# Patient Record
Sex: Female | Born: 1995 | Race: White | Hispanic: No | Marital: Married | Smoking: Never smoker
Health system: Southern US, Community
[De-identification: ages and names within clinical notes are randomized; demographics above are authoritative.]

## PROBLEM LIST (undated history)

## (undated) ENCOUNTER — Inpatient Hospital Stay: Payer: Self-pay

## (undated) DIAGNOSIS — G90A Postural orthostatic tachycardia syndrome (POTS): Secondary | ICD-10-CM

## (undated) DIAGNOSIS — S060X9A Concussion with loss of consciousness of unspecified duration, initial encounter: Secondary | ICD-10-CM

## (undated) DIAGNOSIS — F32A Depression, unspecified: Secondary | ICD-10-CM

## (undated) DIAGNOSIS — S060XAA Concussion with loss of consciousness status unknown, initial encounter: Secondary | ICD-10-CM

## (undated) DIAGNOSIS — K5900 Constipation, unspecified: Secondary | ICD-10-CM

## (undated) DIAGNOSIS — D649 Anemia, unspecified: Secondary | ICD-10-CM

## (undated) DIAGNOSIS — I951 Orthostatic hypotension: Secondary | ICD-10-CM

## (undated) DIAGNOSIS — Z8659 Personal history of other mental and behavioral disorders: Secondary | ICD-10-CM

## (undated) DIAGNOSIS — L309 Dermatitis, unspecified: Secondary | ICD-10-CM

## (undated) DIAGNOSIS — A1801 Tuberculosis of spine: Secondary | ICD-10-CM

## (undated) DIAGNOSIS — F329 Major depressive disorder, single episode, unspecified: Secondary | ICD-10-CM

## (undated) DIAGNOSIS — I498 Other specified cardiac arrhythmias: Secondary | ICD-10-CM

## (undated) DIAGNOSIS — F419 Anxiety disorder, unspecified: Secondary | ICD-10-CM

## (undated) DIAGNOSIS — I499 Cardiac arrhythmia, unspecified: Secondary | ICD-10-CM

## (undated) DIAGNOSIS — E559 Vitamin D deficiency, unspecified: Secondary | ICD-10-CM

## (undated) DIAGNOSIS — M2550 Pain in unspecified joint: Secondary | ICD-10-CM

## (undated) HISTORY — DX: Dermatitis, unspecified: L30.9

## (undated) HISTORY — DX: Anemia, unspecified: D64.9

## (undated) HISTORY — DX: Cardiac arrhythmia, unspecified: I49.9

## (undated) HISTORY — PX: ADENOIDECTOMY: SUR15

## (undated) HISTORY — DX: Postural orthostatic tachycardia syndrome (POTS): G90.A

## (undated) HISTORY — DX: Personal history of other mental and behavioral disorders: Z86.59

## (undated) HISTORY — DX: Other specified cardiac arrhythmias: I49.8

## (undated) HISTORY — DX: Concussion with loss of consciousness status unknown, initial encounter: S06.0XAA

## (undated) HISTORY — DX: Orthostatic hypotension: I95.1

## (undated) HISTORY — DX: Concussion with loss of consciousness of unspecified duration, initial encounter: S06.0X9A

## (undated) HISTORY — DX: Depression, unspecified: F32.A

## (undated) HISTORY — DX: Vitamin D deficiency, unspecified: E55.9

## (undated) HISTORY — DX: Anxiety disorder, unspecified: F41.9

## (undated) HISTORY — DX: Constipation, unspecified: K59.00

---
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
## 2017-03-24 IMAGING — CR DG CHEST 2V
1 series · 2 of 2 positions shown · non-contrast
Comparison: None.

CLINICAL DATA: Chest pain

EXAM:
CHEST  2 VIEW

[Series 1: dg chest 2 view · 0.14mm/px · 2 of 2 slices shown]
[im 1/2]
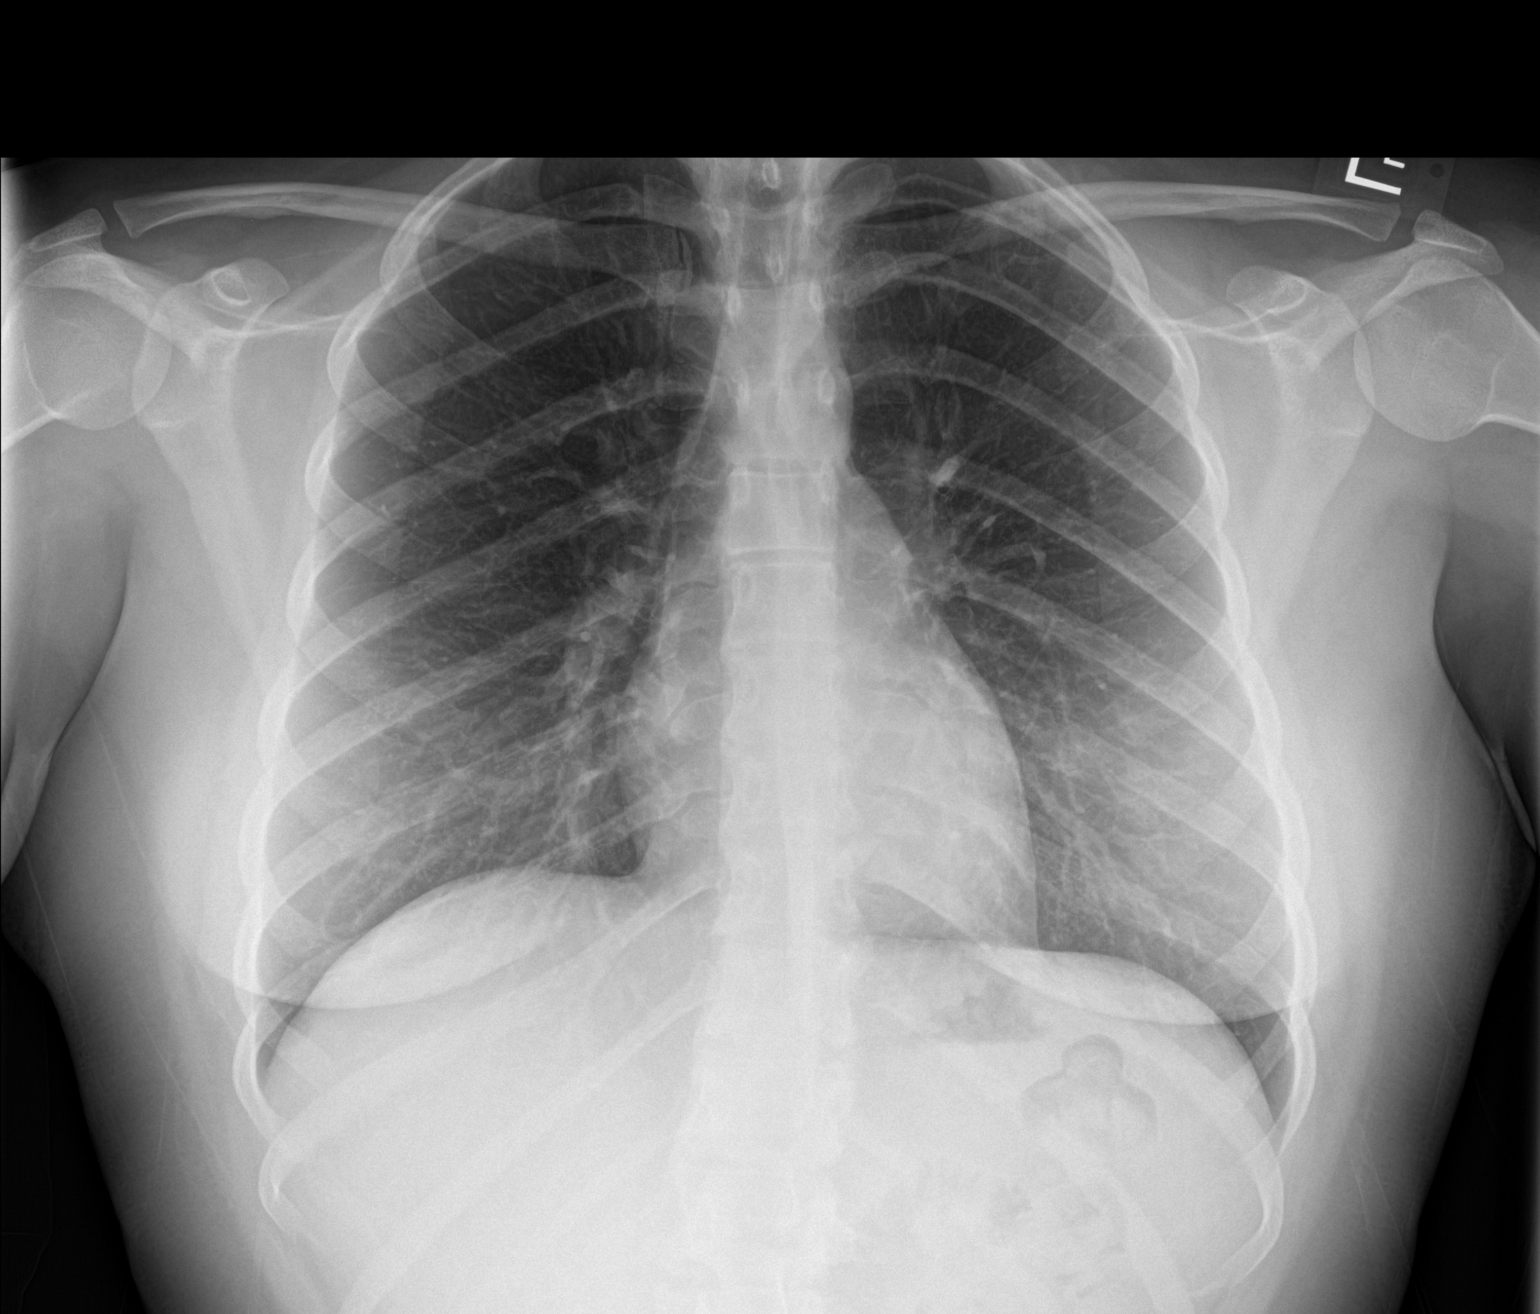
[im 2/2]
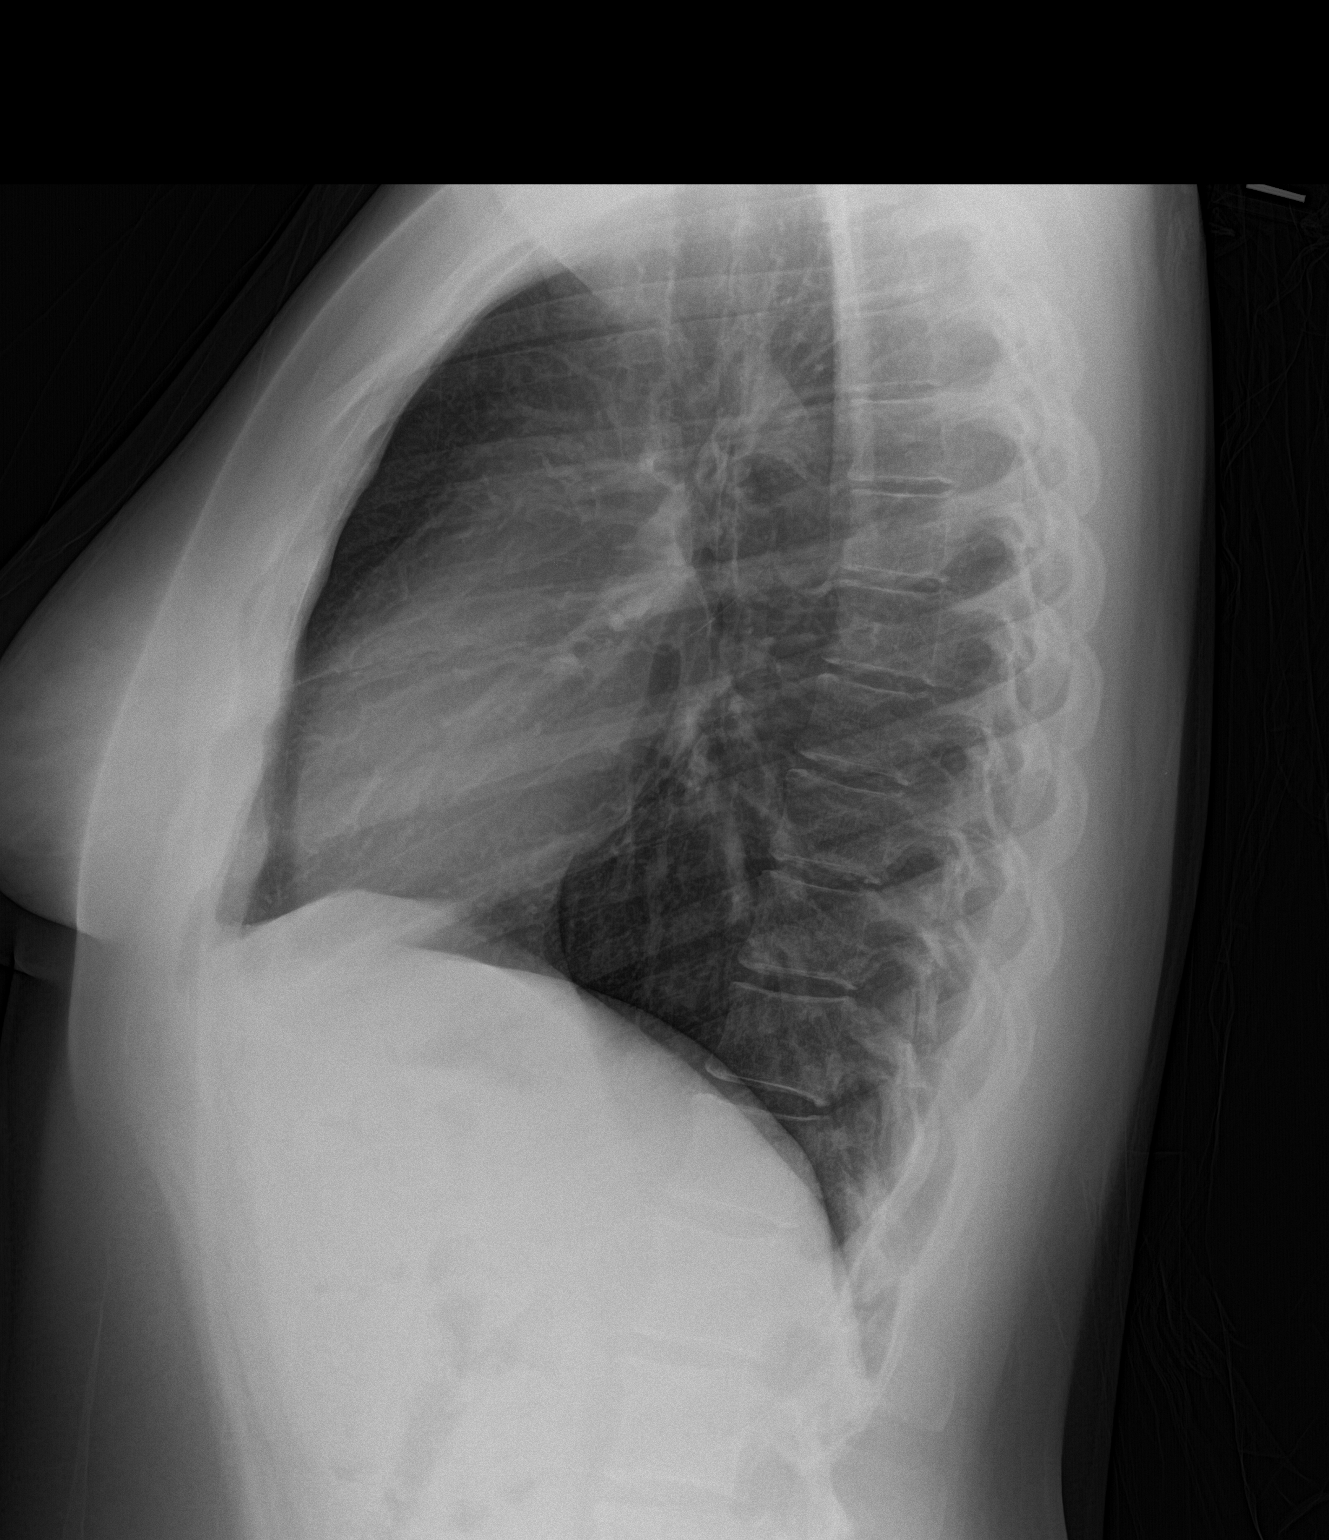

[2 of 2 positions shown; findings below may reference images not displayed]

FINDINGS: Normal heart size. Normal mediastinal contour. No pneumothorax. No
pleural effusion. Lungs appear clear, with no acute consolidative
airspace disease and no pulmonary edema.
IMPRESSION: No active cardiopulmonary disease.

## 2017-04-10 ENCOUNTER — Emergency Department: Payer: BLUE CROSS/BLUE SHIELD

## 2017-04-10 ENCOUNTER — Other Ambulatory Visit: Payer: Self-pay

## 2017-04-10 ENCOUNTER — Emergency Department
Admission: EM | Admit: 2017-04-10 | Discharge: 2017-04-10 | Disposition: A | Discharge status: Home / Self Care | Payer: BLUE CROSS/BLUE SHIELD | Attending: Emergency Medicine | Admitting: Emergency Medicine

## 2017-04-10 DIAGNOSIS — Z79899 Other long term (current) drug therapy: Secondary | ICD-10-CM | POA: Diagnosis not present

## 2017-04-10 DIAGNOSIS — M25512 Pain in left shoulder: Secondary | ICD-10-CM | POA: Insufficient documentation

## 2017-04-10 DIAGNOSIS — R0789 Other chest pain: Secondary | ICD-10-CM | POA: Diagnosis not present

## 2017-04-10 DIAGNOSIS — Y9389 Activity, other specified: Secondary | ICD-10-CM | POA: Diagnosis not present

## 2017-04-10 DIAGNOSIS — M25562 Pain in left knee: Secondary | ICD-10-CM | POA: Diagnosis not present

## 2017-04-10 DIAGNOSIS — Y999 Unspecified external cause status: Secondary | ICD-10-CM | POA: Insufficient documentation

## 2017-04-10 DIAGNOSIS — Y92411 Interstate highway as the place of occurrence of the external cause: Secondary | ICD-10-CM | POA: Insufficient documentation

## 2017-04-10 MED ORDER — CYCLOBENZAPRINE HCL 10 MG PO TABS: 10.0000 mg | ORAL_TABLET | Freq: Three times a day (TID) | ORAL | 0 refills | Status: AC | PRN

## 2017-04-10 MED ORDER — KETOROLAC TROMETHAMINE 30 MG/ML IJ SOLN
30.0000 mg | Freq: Once | INTRAMUSCULAR | Status: AC
  Administered 2017-04-10: 30 mg via INTRAMUSCULAR
  Filled 2017-04-10: qty 1

## 2017-04-10 MED ORDER — ORPHENADRINE CITRATE 30 MG/ML IJ SOLN
60.0000 mg | Freq: Two times a day (BID) | INTRAMUSCULAR | Status: DC
  Filled 2017-04-10: qty 2

## 2017-04-10 MED ORDER — MELOXICAM 7.5 MG PO TABS: 7.5000 mg | ORAL_TABLET | Freq: Every day | ORAL | 1 refills | Status: AC
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
## 2017-04-10 NOTE — ED Provider Notes (Signed)
Lurline Idol, attending physician, personally viewed and interpreted this EKG  EKG Time: 2251 Rate: 73 Rhythm: normal sinus rhythm Axis: normal Intervals: qtc 425 QRS: narrow ST changes: no st elevation Impression: normal Maryan Puls, MD 04/10/17 2257
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
## 2017-06-22 ENCOUNTER — Emergency Department: Payer: BLUE CROSS/BLUE SHIELD

## 2017-06-22 ENCOUNTER — Emergency Department
Admission: EM | Admit: 2017-06-22 | Discharge: 2017-06-22 | Disposition: A | Discharge status: Home / Self Care | Payer: BLUE CROSS/BLUE SHIELD | Attending: Emergency Medicine | Admitting: Emergency Medicine

## 2017-06-22 ENCOUNTER — Other Ambulatory Visit: Payer: Self-pay

## 2017-06-22 ENCOUNTER — Encounter: Payer: Self-pay | Admitting: Emergency Medicine

## 2017-06-22 DIAGNOSIS — Y9301 Activity, walking, marching and hiking: Secondary | ICD-10-CM | POA: Diagnosis not present

## 2017-06-22 DIAGNOSIS — S99911A Unspecified injury of right ankle, initial encounter: Secondary | ICD-10-CM | POA: Diagnosis present

## 2017-06-22 DIAGNOSIS — S93431A Sprain of tibiofibular ligament of right ankle, initial encounter: Secondary | ICD-10-CM | POA: Diagnosis not present

## 2017-06-22 DIAGNOSIS — Z79899 Other long term (current) drug therapy: Secondary | ICD-10-CM | POA: Insufficient documentation

## 2017-06-22 DIAGNOSIS — S93491A Sprain of other ligament of right ankle, initial encounter: Secondary | ICD-10-CM

## 2017-06-22 DIAGNOSIS — X509XXA Other and unspecified overexertion or strenuous movements or postures, initial encounter: Secondary | ICD-10-CM | POA: Insufficient documentation

## 2017-06-22 DIAGNOSIS — Y929 Unspecified place or not applicable: Secondary | ICD-10-CM | POA: Diagnosis not present

## 2017-06-22 DIAGNOSIS — Y999 Unspecified external cause status: Secondary | ICD-10-CM | POA: Diagnosis not present

## 2017-06-22 MED ORDER — NAPROXEN 500 MG PO TABS: 500.0000 mg | ORAL_TABLET | Freq: Two times a day (BID) | ORAL | 0 refills | Status: DC

## 2017-06-22 MED ORDER — TRAMADOL HCL 50 MG PO TABS: 50.0000 mg | ORAL_TABLET | Freq: Four times a day (QID) | ORAL | 0 refills | Status: DC | PRN

## 2017-06-22 NOTE — ED Provider Notes (Signed)
[facility] Emergency Department Provider Note ____________________________________________  Time seen: Approximately 1:40 PM  I have reviewed the triage vital signs and the nursing notes.   HISTORY  Chief Complaint Ankle Pain    HPI Ashley Strickland is a 21 y.o. female who presents to the emergency department for evaluation of right ankle pain. She states that she slipped and twisted it 2 days ago, then injured it again today while cleaning. She states that she felt a "pop." She has not taken any medications or attempted any alleviating measures prior to arrival.  Past Medical History:  Diagnosis Date  . Joint pain     There are no active problems to display for this patient.   Past Surgical History:  Procedure Laterality Date  . ADENOIDECTOMY      Prior to Admission medications   Medication Sig Start Date End Date Taking? Authorizing Provider  meclizine (ANTIVERT) 12.5 MG tablet Take 1 tablet (12.5 mg total) by mouth 3 (three) times daily as needed for dizziness. 10/31/16   Menshew, Charlesetta IvoryJenise V Bacon, PA-C  naproxen (NAPROSYN) 500 MG tablet Take 1 tablet (500 mg total) by mouth 2 (two) times daily with a meal. 06/22/17   Denzil Mceachron B, FNP  OMEPRAZOLE PO Take by mouth.    [provider]  ondansetron (ZOFRAN ODT) 4 MG disintegrating tablet Take 1 tablet (4 mg total) by mouth every 8 (eight) hours as needed for nausea or vomiting. 10/31/16   Menshew, Charlesetta IvoryJenise V Bacon, PA-C  ranitidine (ZANTAC) 75 MG tablet Take 1 tablet (75 mg total) by mouth 2 (two) times daily. 08/30/15   Schaevitz, Myra Rudeavid Matthew, MD  traMADol (ULTRAM) 50 MG tablet Take 1 tablet (50 mg total) by mouth every 6 (six) hours as needed. 06/22/17   Chinita Pesterriplett, Roselind Klus B, FNP    Allergies Phenergan [promethazine hcl]  No family history on file.  Social History Social History   Tobacco Use  . Smoking status: Never Smoker  . Smokeless tobacco: Never Used  Substance Use Topics  .  Alcohol use: No  . Drug use: No    Review of Systems Constitutional: Negative for recent illness. Cardiovascular: Negative for chest pain. Respiratory: Negative for shortness of breath. Musculoskeletal: Positive for right ankle pain. Skin: Negative for lesion or wound.  Neurological: Negative for paresthesias.  ____________________________________________   PHYSICAL EXAM:  VITAL SIGNS: ED Triage Vitals  Enc Vitals Group     BP 06/22/17 1305 120/77     Pulse Rate 06/22/17 1305 (!) 118     Resp 06/22/17 1305 15     Temp 06/22/17 1305 98.3 F (36.8 C)     Temp Source 06/22/17 1305 Oral     SpO2 06/22/17 1305 99 %     Weight 06/22/17 1255 200 lb (90.7 kg)     Height --      Head Circumference --      Peak Flow --      Pain Score 06/22/17 1255 7     Pain Loc --      Pain Edu? --      Excl. in GC? --     Constitutional: Alert and oriented. Well appearing and in no acute distress. Eyes: Conjunctivae are clear without discharge or drainage Head: Atraumatic Neck: Supple. Respiratory: Respirations are even and unlabored. Musculoskeletal: Limited flexion and extension of the right foot. Tenderness is diffuse over the ATFL.  Neurologic: Movement and sensation is intact.  Skin: Small, superficial abrasion is present over  the dorsal aspect of the right foot.  Psychiatric: Affect and behavior are normal.  ____________________________________________   LABS (all labs ordered are listed, but only abnormal results are displayed)  Labs Reviewed - No data to display ____________________________________________  RADIOLOGY  Image of the right ankle is negative for acute bony abnormality per radiology. ____________________________________________   PROCEDURES  Procedures Velcro ankle stirrup splint applied by ER tech. Patient neurovascularly intact post application.  ____________________________________________   INITIAL IMPRESSION / ASSESSMENT AND PLAN / ED  COURSE  Ashley Molamariah Joy Pagliuca is a 21 y.o. female who presents to the emergency department for treatment and evaluation of right ankle pain. X-rays and exam are most consistent with ankle sprain. She is to follow up with the podiatrist for symptoms that are not improving over the next week or so. She is to return to the ER for symptoms that change or worsen or for new concerns if unable to schedule an appointment.  Medications - No data to display  Pertinent labs & imaging results that were available during my care of the patient were reviewed by me and considered in my medical decision making (see chart for details).  _________________________________________   FINAL CLINICAL IMPRESSION(S) / ED DIAGNOSES  Final diagnoses:  Sprain of anterior talofibular ligament of right ankle, initial encounter    ED Discharge Orders        Ordered    naproxen (NAPROSYN) 500 MG tablet  2 times daily with meals     06/22/17 1432    traMADol (ULTRAM) 50 MG tablet  Every 6 hours PRN     06/22/17 1432       If controlled substance prescribed during this visit, 12 month history viewed on the [place] prior to issuing an initial prescription for Schedule II or III opiod.    Chinita Pesterriplett, Shaquayla Klimas B, FNP 06/22/17 1527    Emily FilbertWilliams, Jonathan E, MD 06/24/17 [phone]

## 2017-06-22 NOTE — Discharge Instructions (Signed)
Follow up with the podiatrist for symptoms that are not improving over the next few days.   Return to the ER for symptoms that change or worsen if unable to schedule an appointment.

## 2017-06-22 NOTE — ED Triage Notes (Signed)
Pt with right ankle pain.  

## 2017-09-01 LAB — CBC AND DIFFERENTIAL
HCT: 42 (ref 36–46)
Hemoglobin: 14.1 (ref 12.0–16.0)
Platelets: 334 (ref 150–399)
WBC: 19.2

## 2017-09-01 LAB — LIPID PANEL
Cholesterol: 139 (ref 0–200)
HDL: 57 (ref 35–70)
LDL Cholesterol: 61
LDl/HDL Ratio: 1.1
Triglycerides: 104 (ref 40–160)

## 2017-09-01 LAB — BASIC METABOLIC PANEL
BUN: 13 (ref 4–21)
CO2: 23 — AB (ref 13–22)
Chloride: 103 (ref 99–108)
Creatinine: 0.9 (ref 0.5–1.1)
Glucose: 85
Potassium: 4.5 (ref 3.4–5.3)
Sodium: 141 (ref 137–147)

## 2017-09-01 LAB — TSH: TSH: 0.76 (ref 0.41–5.90)

## 2017-09-01 LAB — CBC: RBC: 5.1 (ref 3.87–5.11)

## 2017-09-01 LAB — HEPATIC FUNCTION PANEL
ALT: 11 (ref 7–35)
AST: 12 — AB (ref 13–35)
Alkaline Phosphatase: 82 (ref 25–125)
Bilirubin, Total: 1.1

## 2017-09-01 LAB — COMPREHENSIVE METABOLIC PANEL
Albumin: 3.9 (ref 3.5–5.0)
Calcium: 9.5 (ref 8.7–10.7)

## 2017-09-01 LAB — VITAMIN D 25 HYDROXY (VIT D DEFICIENCY, FRACTURES): Vit D, 25-Hydroxy: 22.7

## 2017-10-05 ENCOUNTER — Encounter: Payer: Self-pay | Admitting: Emergency Medicine

## 2017-10-05 ENCOUNTER — Other Ambulatory Visit: Payer: Self-pay

## 2017-10-05 DIAGNOSIS — Y929 Unspecified place or not applicable: Secondary | ICD-10-CM | POA: Diagnosis not present

## 2017-10-05 DIAGNOSIS — W57XXXA Bitten or stung by nonvenomous insect and other nonvenomous arthropods, initial encounter: Secondary | ICD-10-CM | POA: Diagnosis not present

## 2017-10-05 DIAGNOSIS — Y999 Unspecified external cause status: Secondary | ICD-10-CM | POA: Diagnosis not present

## 2017-10-05 DIAGNOSIS — Y939 Activity, unspecified: Secondary | ICD-10-CM | POA: Insufficient documentation

## 2017-10-05 DIAGNOSIS — S70362A Insect bite (nonvenomous), left thigh, initial encounter: Secondary | ICD-10-CM | POA: Insufficient documentation

## 2017-10-05 DIAGNOSIS — L03116 Cellulitis of left lower limb: Secondary | ICD-10-CM | POA: Insufficient documentation

## 2017-10-05 NOTE — ED Triage Notes (Signed)
Pt presents to ED with c/o possible insect bite to left thigh. Pt states she woke up this morning with redness and pain to affected area. Pt states the redness has increased in size throughout the day. Denies fever.

## 2017-10-06 ENCOUNTER — Emergency Department
Admission: EM | Admit: 2017-10-06 | Discharge: 2017-10-06 | Disposition: A | Discharge status: Home / Self Care | Payer: BLUE CROSS/BLUE SHIELD | Attending: Emergency Medicine | Admitting: Emergency Medicine

## 2017-10-06 ENCOUNTER — Other Ambulatory Visit: Payer: Self-pay

## 2017-10-06 DIAGNOSIS — W57XXXA Bitten or stung by nonvenomous insect and other nonvenomous arthropods, initial encounter: Secondary | ICD-10-CM

## 2017-10-06 DIAGNOSIS — S70362A Insect bite (nonvenomous), left thigh, initial encounter: Secondary | ICD-10-CM

## 2017-10-06 DIAGNOSIS — L03116 Cellulitis of left lower limb: Secondary | ICD-10-CM

## 2017-10-06 HISTORY — DX: Tuberculosis of spine: A18.01

## 2017-10-06 MED ORDER — CEPHALEXIN 500 MG PO CAPS
500.0000 mg | ORAL_CAPSULE | Freq: Once | ORAL | Status: AC
  Administered 2017-10-06: 500 mg via ORAL
  Filled 2017-10-06 (×2): qty 1

## 2017-10-06 MED ORDER — CEPHALEXIN 500 MG PO CAPS: 500.0000 mg | ORAL_CAPSULE | Freq: Two times a day (BID) | ORAL | 0 refills | Status: AC

## 2017-10-06 NOTE — ED Notes (Signed)
Pt to the er for a bug bite to the left thigh. Pt noticed it this morning at 1030 am. Pt marked it with a pen at 2200 to note size. Redness has not spread past the pen markings as of. Pt said it started as the size of a quarter.

## 2017-10-06 NOTE — ED Provider Notes (Signed)
[facility] Emergency Department Provider Note    First MD Initiated Contact with Patient 10/06/17 0131     (approximate)  I have reviewed the triage vital signs and the nursing notes.   HISTORY  Chief Complaint Insect Bite    HPI Ashley Strickland is a 22 y.o. female with below list of chronic medical presents to the emergency department with concern for insect bite to the left thigh with associated increasing redness and tenderness.  Patient stated that she noticed the area this yesterday morning on awakening.   Past Medical History:  Diagnosis Date  . Joint pain   . Pott's disease     There are no active problems to display for this patient.   Past Surgical History:  Procedure Laterality Date  . ADENOIDECTOMY      Prior to Admission medications   Medication Sig Start Date End Date Taking? Authorizing Provider  meclizine (ANTIVERT) 12.5 MG tablet Take 1 tablet (12.5 mg total) by mouth 3 (three) times daily as needed for dizziness. 10/31/16   Menshew, Charlesetta IvoryJenise V Bacon, PA-C  naproxen (NAPROSYN) 500 MG tablet Take 1 tablet (500 mg total) by mouth 2 (two) times daily with a meal. 06/22/17   Triplett, Cari B, FNP  OMEPRAZOLE PO Take by mouth.    [provider]  ondansetron (ZOFRAN ODT) 4 MG disintegrating tablet Take 1 tablet (4 mg total) by mouth every 8 (eight) hours as needed for nausea or vomiting. 10/31/16   Menshew, Charlesetta IvoryJenise V Bacon, PA-C  ranitidine (ZANTAC) 75 MG tablet Take 1 tablet (75 mg total) by mouth 2 (two) times daily. 08/30/15   Schaevitz, Myra Rudeavid Matthew, MD  traMADol (ULTRAM) 50 MG tablet Take 1 tablet (50 mg total) by mouth every 6 (six) hours as needed. 06/22/17   Chinita Pesterriplett, Cari B, FNP    Allergies Phenergan [promethazine hcl]  No family history on file.  Social History Social History   Tobacco Use  . Smoking status: Never Smoker  . Smokeless tobacco: Never Used  Substance Use Topics  . Alcohol use: No  . Drug use: No     Review of Systems Constitutional: No fever/chills Eyes: No visual changes. ENT: No sore throat. Cardiovascular: Denies chest pain. Respiratory: Denies shortness of breath. Gastrointestinal: No abdominal pain.  No nausea, no vomiting.  No diarrhea.  No constipation. Genitourinary: Negative for dysuria. Musculoskeletal: Negative for neck pain.  Negative for back pain. Integumentary: Negative for rash.  Positive for redness to the left thigh Neurological: Negative for headaches, focal weakness or numbness.   ____________________________________________   PHYSICAL EXAM:  VITAL SIGNS: ED Triage Vitals  Enc Vitals Group     BP 10/05/17 2350 120/83     Pulse Rate 10/05/17 2350 (!) 120     Resp 10/05/17 2350 20     Temp 10/05/17 2350 97.9 F (36.6 C)     Temp Source 10/05/17 2350 Oral     SpO2 10/05/17 2350 98 %     Weight 10/05/17 2351 89.4 kg (197 lb)     Height 10/05/17 2351 1.549 m (5\' 1" )     Head Circumference --      Peak Flow --      Pain Score 10/05/17 2350 5     Pain Loc --      Pain Edu? --      Excl. in GC? --     Constitutional: Alert and oriented. Well appearing and in no acute distress. Eyes: Conjunctivae are  normal.  Head: Atraumatic. Mouth/Throat: Mucous membranes are moist.  Oropharynx non-erythematous. Neck: No stridor.   Cardiovascular: Normal rate, regular rhythm. Good peripheral circulation. Grossly normal heart sounds. Respiratory: Normal respiratory effort.  No retractions. Lungs CTAB. Gastrointestinal: Soft and nontender. No distention.  Musculoskeletal: No lower extremity tenderness nor edema. No gross deformities of extremities. Neurologic:  Normal speech and language. No gross focal neurologic deficits are appreciated.  Skin: Oval 9 x 6 cm area of blanching erythema medial aspect of the left Psychiatric: Mood and affect are normal. Speech and behavior are  normal.  _   Procedures   ____________________________________________   INITIAL IMPRESSION / ASSESSMENT AND PLAN / ED COURSE  As part of my medical decision making, I reviewed the following data within the electronic MEDICAL RECORD NUMBER   22 year old female presented with above-stated history and physical exam consistent with infected insect bite with associated cellulitis of the left thigh.  Patient given Keflex in the emergency department will be prescribed the same for home.  Spoke with the patient at length regarding warning signs that would warrant return to the emergency department. ____________________________________________  FINAL CLINICAL IMPRESSION(S) / ED DIAGNOSES  Final diagnoses:  Cellulitis of left lower extremity  Insect bite of left thigh, initial encounter     MEDICATIONS GIVEN DURING THIS VISIT:  Medications  cephALEXin (KEFLEX) capsule 500 mg (has no administration in time range)     ED Discharge Orders    None       Note:  This document was prepared using Dragon voice recognition software and may include unintentional dictation errors.    Darci Current, MD 10/06/17 [phone]

## 2018-02-15 ENCOUNTER — Ambulatory Visit (INDEPENDENT_AMBULATORY_CARE_PROVIDER_SITE_OTHER): Payer: BLUE CROSS/BLUE SHIELD | Admitting: Obstetrics and Gynecology

## 2018-02-15 ENCOUNTER — Ambulatory Visit (INDEPENDENT_AMBULATORY_CARE_PROVIDER_SITE_OTHER): Payer: BLUE CROSS/BLUE SHIELD

## 2018-02-15 VITALS — BP 108/72 | HR 94 | Ht 61.0 in | Wt 225.9 lb

## 2018-02-15 DIAGNOSIS — Z3687 Encounter for antenatal screening for uncertain dates: Secondary | ICD-10-CM

## 2018-02-15 DIAGNOSIS — N8312 Corpus luteum cyst of left ovary: Secondary | ICD-10-CM | POA: Diagnosis not present

## 2018-02-15 DIAGNOSIS — O3411 Maternal care for benign tumor of corpus uteri, first trimester: Secondary | ICD-10-CM

## 2018-02-15 DIAGNOSIS — Z3A10 10 weeks gestation of pregnancy: Secondary | ICD-10-CM

## 2018-02-15 DIAGNOSIS — R638 Other symptoms and signs concerning food and fluid intake: Secondary | ICD-10-CM | POA: Insufficient documentation

## 2018-02-15 DIAGNOSIS — Z3401 Encounter for supervision of normal first pregnancy, first trimester: Secondary | ICD-10-CM | POA: Insufficient documentation

## 2018-02-15 DIAGNOSIS — Z113 Encounter for screening for infections with a predominantly sexual mode of transmission: Secondary | ICD-10-CM

## 2018-02-15 DIAGNOSIS — A1801 Tuberculosis of spine: Secondary | ICD-10-CM | POA: Insufficient documentation

## 2018-02-15 DIAGNOSIS — O219 Vomiting of pregnancy, unspecified: Secondary | ICD-10-CM | POA: Insufficient documentation

## 2018-02-15 NOTE — Progress Notes (Signed)
Ashley Strickland presents for NOB nurse interview visit. Pregnancy confirmation done _at [facility] - 01/20/2018.  G1- .  P- 0.  LMP_ 12/09/2017 - uncertain- EDD- 09/15/2018. 9 5/7. Dating scan ordered. Pregnancy education material explained and given. _0__ cats in the home. NOB labs ordered. TSH/HbgA1c ordered due to Increased BMI- Body mass index is 42.68 kg/m.   HIV labs and Drug screen were explained and ordered. PNV encouraged. Pos n/v. Given Zofran per pcp. Currently sx controlled. Pt has Potts disease. Prior to pregnancy  only symptom was dizziness. Currently she has no sx. Advised to discuss with provider. Financial policy reviewed. FMLA form signed. Genetic screening options discussed. Genetic testing: Unsure.  Pt may discuss with provider. Pt. To follow up with provider in _3_ weeks for NOB physical.  All questions answered.

## 2018-02-15 NOTE — Patient Instructions (Signed)
WHAT OB PATIENTS CAN EXPECT   Confirmation of pregnancy and ultrasound ordered if medically indicated-[redacted] weeks gestation  New OB (NOB) intake with nurse and New OB (NOB) labs- [redacted] weeks gestation  New OB (NOB) physical examination with provider- 11/[redacted] weeks gestation  Flu vaccine-[redacted] weeks gestation  Anatomy scan-[redacted] weeks gestation  Glucose tolerance test, blood work to test for anemia, T-dap vaccine-[redacted] weeks gestation  Vaginal swabs/cultures-STD/Group B strep-[redacted] weeks gestation  Appointments every 4 weeks until 28 weeks  Every 2 weeks from 28 weeks until 36 weeks  Weekly visits from 36 weeks until delivery  Morning Sickness Morning sickness is when you feel sick to your stomach (nauseous) during pregnancy. You may feel sick to your stomach and throw up (vomit). You may feel sick in the morning, but you can feel this way any time of day. Some women feel very sick to their stomach and cannot stop throwing up (hyperemesis gravidarum). Follow these instructions at home:  Only take medicines as told by your doctor.  Take multivitamins as told by your doctor. Taking multivitamins before getting pregnant can stop or lessen the harshness of morning sickness.  Eat dry toast or unsalted crackers before getting out of bed.  Eat 5 to 6 small meals a day.  Eat dry and bland foods like rice and baked potatoes.  Do not drink liquids with meals. Drink between meals.  Do not eat greasy, fatty, or spicy foods.  Have someone cook for you if the smell of food causes you to feel sick or throw up.  If you feel sick to your stomach after taking prenatal vitamins, take them at night or with a snack.  Eat protein when you need a snack (nuts, yogurt, cheese).  Eat unsweetened gelatins for dessert.  Wear a bracelet used for sea sickness (acupressure wristband).  Go to a doctor that puts thin needles into certain body points (acupuncture) to improve how you feel.  Do not smoke.  Use a  humidifier to keep the air in your house free of odors.  Get lots of fresh air. Contact a doctor if:  You need medicine to feel better.  You feel dizzy or lightheaded.  You are losing weight. Get help right away if:  You feel very sick to your stomach and cannot stop throwing up.  You pass out (faint). This information is not intended to replace advice given to you by your health care provider. Make sure you discuss any questions you have with your health care provider. Document Released: 07/24/2004 Document Revised: 11/22/2015 Document Reviewed: 12/01/2012 Elsevier Interactive Patient Education  2017 [employer]. How a Baby Grows During Pregnancy Pregnancy begins when a female's sperm enters a female's egg (fertilization). This happens in one of the tubes (fallopian tubes) that connect the ovaries to the womb (uterus). The fertilized egg is called an embryo until it reaches 10 weeks. From 10 weeks until birth, it is called a fetus. The fertilized egg moves down the fallopian tube to the uterus. Then it implants into the lining of the uterus and begins to grow. The developing fetus receives oxygen and nutrients through the pregnant woman's bloodstream and the tissues that grow (placenta) to support the fetus. The placenta is the life support system for the fetus. It provides nutrition and removes waste. Learning as much as you can about your pregnancy and how your baby is developing can help you enjoy the experience. It can also make you aware of when there might be a problem  and when to ask questions. How long does a typical pregnancy last? A pregnancy usually lasts 280 days, or about 40 weeks. Pregnancy is divided into three trimesters:  First trimester: 0-13 weeks.  Second trimester: 14-27 weeks.  Third trimester: 28-40 weeks.  The day when your baby is considered ready to be born (full term) is your estimated date of delivery. How does my baby develop month by month? First  month  The fertilized egg attaches to the inside of the uterus.  Some cells will form the placenta. Others will form the fetus.  The arms, legs, brain, spinal cord, lungs, and heart begin to develop.  At the end of the first month, the heart begins to beat.  Second month  The bones, inner ear, eyelids, hands, and feet form.  The genitals develop.  By the end of 8 weeks, all major organs are developing.  Third month  All of the internal organs are forming.  Teeth develop below the gums.  Bones and muscles begin to grow. The spine can flex.  The skin is transparent.  Fingernails and toenails begin to form.  Arms and legs continue to grow longer, and hands and feet develop.  The fetus is about 3 in (7.6 cm) long.  Fourth month  The placenta is completely formed.  The external sex organs, neck, outer ear, eyebrows, eyelids, and fingernails are formed.  The fetus can hear, swallow, and move its arms and legs.  The kidneys begin to produce urine.  The skin is covered with a white waxy coating (vernix) and very fine hair (lanugo).  Fifth month  The fetus moves around more and can be felt for the first time (quickening).  The fetus starts to sleep and wake up and may begin to suck its finger.  The nails grow to the end of the fingers.  The organ in the digestive system that makes bile (gallbladder) functions and helps to digest the nutrients.  If your baby is a girl, eggs are present in her ovaries. If your baby is a boy, testicles start to move down into his scrotum.  Sixth month  The lungs are formed, but the fetus is not yet able to breathe.  The eyes open. The brain continues to develop.  Your baby has fingerprints and toe prints. Your baby's hair grows thicker.  At the end of the second trimester, the fetus is about 9 in (22.9 cm) long.  Seventh month  The fetus kicks and stretches.  The eyes are developed enough to sense changes in light.  The  hands can make a grasping motion.  The fetus responds to sound.  Eighth month  All organs and body systems are fully developed and functioning.  Bones harden and taste buds develop. The fetus may hiccup.  Certain areas of the brain are still developing. The skull remains soft.  Ninth month  The fetus gains about  lb (0.23 kg) each week.  The lungs are fully developed.  Patterns of sleep develop.  The fetus's head typically moves into a head-down position (vertex) in the uterus to prepare for birth. If the buttocks move into a vertex position instead, the baby is breech.  The fetus weighs 6-9 lbs (2.72-4.08 kg) and is 19-20 in (48.26-50.8 cm) long.  What can I do to have a healthy pregnancy and help my baby develop? Eating and Drinking  Eat a healthy diet. ? Talk with your health care provider to make sure that you are getting the  nutrients that you and your baby need. ? Visit www.BuildDNA.es to learn about creating a healthy diet.  Gain a healthy amount of weight during pregnancy as advised by your health care provider. This is usually 25-35 pounds. You may need to: ? Gain more if you were underweight before getting pregnant or if you are pregnant with more than one baby. ? Gain less if you were overweight or obese when you got pregnant.  Medicines and Vitamins  Take prenatal vitamins as directed by your health care provider. These include vitamins such as folic acid, iron, calcium, and vitamin D. They are important for healthy development.  Take medicines only as directed by your health care provider. Read labels and ask a pharmacist or your health care provider whether over-the-counter medicines, supplements, and prescription drugs are safe to take during pregnancy.  Activities  Be physically active as advised by your health care provider. Ask your health care provider to recommend activities that are safe for you to do, such as walking or swimming.  Do not  participate in strenuous or extreme sports.  Lifestyle  Do not drink alcohol.  Do not use any tobacco products, including cigarettes, chewing tobacco, or electronic cigarettes. If you need help quitting, ask your health care provider.  Do not use illegal drugs.  Safety  Avoid exposure to mercury, lead, or other heavy metals. Ask your health care provider about common sources of these heavy metals.  Avoid listeria infection during pregnancy. Follow these precautions: ? Do not eat soft cheeses or deli meats. ? Do not eat hot dogs unless they have been warmed up to the point of steaming, such as in the microwave oven. ? Do not drink unpasteurized milk.  Avoid toxoplasmosis infection during pregnancy. Follow these precautions: ? Do not change your cat's litter box, if you have a cat. Ask someone else to do this for you. ? Wear gardening gloves while working in the yard.  General Instructions  Keep all follow-up visits as directed by your health care provider. This is important. This includes prenatal care and screening tests.  Manage any chronic health conditions. Work closely with your health care provider to keep conditions, such as diabetes, under control.  How do I know if my baby is developing well? At each prenatal visit, your health care provider will do several different tests to check on your health and keep track of your baby's development. These include:  Fundal height. ? Your health care provider will measure your growing belly from top to bottom using a tape measure. ? Your health care provider will also feel your belly to determine your baby's position.  Heartbeat. ? An ultrasound in the first trimester can confirm pregnancy and show a heartbeat, depending on how far along you are. ? Your health care provider will check your baby's heart rate at every prenatal visit. ? As you get closer to your delivery date, you may have regular fetal heart rate monitoring to make  sure that your baby is not in distress.  Second trimester ultrasound. ? This ultrasound checks your baby's development. It also indicates your baby's gender.  What should I do if I have concerns about my baby's development? Always talk with your health care provider about any concerns that you may have. This information is not intended to replace advice given to you by your health care provider. Make sure you discuss any questions you have with your health care provider. Document Released: 12/03/2007 Document Revised: 11/22/2015 Document Reviewed:  11/23/2013 Elsevier Interactive Patient Education  2018 [place] Trimester of Pregnancy The first trimester of pregnancy is from week 1 until the end of week 13 (months 1 through 3). A week after a sperm fertilizes an egg, the egg will implant on the wall of the uterus. This embryo will begin to develop into a baby. Genes from you and your partner will form the baby. The female genes will determine whether the baby will be a boy or a girl. At 6-8 weeks, the eyes and face will be formed, and the heartbeat can be seen on ultrasound. At the end of 12 weeks, all the baby's organs will be formed. Now that you are pregnant, you will want to do everything you can to have a healthy baby. Two of the most important things are to get good prenatal care and to follow your health care provider's instructions. Prenatal care is all the medical care you receive before the baby's birth. This care will help prevent, find, and treat any problems during the pregnancy and childbirth. Body changes during your first trimester Your body goes through many changes during pregnancy. The changes vary from woman to woman.  You may gain or lose a couple of pounds at first.  You may feel sick to your stomach (nauseous) and you may throw up (vomit). If the vomiting is uncontrollable, call your health care provider.  You may tire easily.  You may develop headaches that can  be relieved by medicines. All medicines should be approved by your health care provider.  You may urinate more often. Painful urination may mean you have a bladder infection.  You may develop heartburn as a result of your pregnancy.  You may develop constipation because certain hormones are causing the muscles that push stool through your intestines to slow down.  You may develop hemorrhoids or swollen veins (varicose veins).  Your breasts may begin to grow larger and become tender. Your nipples may stick out more, and the tissue that surrounds them (areola) may become darker.  Your gums may bleed and may be sensitive to brushing and flossing.  Dark spots or blotches (chloasma, mask of pregnancy) may develop on your face. This will likely fade after the baby is born.  Your menstrual periods will stop.  You may have a loss of appetite.  You may develop cravings for certain kinds of food.  You may have changes in your emotions from day to day, such as being excited to be pregnant or being concerned that something may go wrong with the pregnancy and baby.  You may have more vivid and strange dreams.  You may have changes in your hair. These can include thickening of your hair, rapid growth, and changes in texture. Some women also have hair loss during or after pregnancy, or hair that feels dry or thin. Your hair will most likely return to normal after your baby is born.  What to expect at prenatal visits During a routine prenatal visit:  You will be weighed to make sure you and the baby are growing normally.  Your blood pressure will be taken.  Your abdomen will be measured to track your baby's growth.  The fetal heartbeat will be listened to between weeks 10 and 14 of your pregnancy.  Test results from any previous visits will be discussed.  Your health care provider may ask you:  How you are feeling.  If you are feeling the baby move.  If you have had any  abnormal  symptoms, such as leaking fluid, bleeding, severe headaches, or abdominal cramping.  If you are using any tobacco products, including cigarettes, chewing tobacco, and electronic cigarettes.  If you have any questions.  Other tests that may be performed during your first trimester include:  Blood tests to find your blood type and to check for the presence of any previous infections. The tests will also be used to check for low iron levels (anemia) and protein on red blood cells (Rh antibodies). Depending on your risk factors, or if you previously had diabetes during pregnancy, you may have tests to check for high blood sugar that affects pregnant women (gestational diabetes).  Urine tests to check for infections, diabetes, or protein in the urine.  An ultrasound to confirm the proper growth and development of the baby.  Fetal screens for spinal cord problems (spina bifida) and Down syndrome.  HIV (human immunodeficiency virus) testing. Routine prenatal testing includes screening for HIV, unless you choose not to have this test.  You may need other tests to make sure you and the baby are doing well.  Follow these instructions at home: Medicines  Follow your health care provider's instructions regarding medicine use. Specific medicines may be either safe or unsafe to take during pregnancy.  Take a prenatal vitamin that contains at least 600 micrograms (mcg) of folic acid.  If you develop constipation, try taking a stool softener if your health care provider approves. Eating and drinking  Eat a balanced diet that includes fresh fruits and vegetables, whole grains, good sources of protein such as meat, eggs, or tofu, and low-fat dairy. Your health care provider will help you determine the amount of weight gain that is right for you.  Avoid raw meat and uncooked cheese. These carry germs that can cause birth defects in the baby.  Eating four or five small meals rather than three large  meals a day may help relieve nausea and vomiting. If you start to feel nauseous, eating a few soda crackers can be helpful. Drinking liquids between meals, instead of during meals, also seems to help ease nausea and vomiting.  Limit foods that are high in fat and processed sugars, such as fried and sweet foods.  To prevent constipation: ? Eat foods that are high in fiber, such as fresh fruits and vegetables, whole grains, and beans. ? Drink enough fluid to keep your urine clear or pale yellow. Activity  Exercise only as directed by your health care provider. Most women can continue their usual exercise routine during pregnancy. Try to exercise for 30 minutes at least 5 days a week. Exercising will help you: ? Control your weight. ? Stay in shape. ? Be prepared for labor and delivery.  Experiencing pain or cramping in the lower abdomen or lower back is a good sign that you should stop exercising. Check with your health care provider before continuing with normal exercises.  Try to avoid standing for long periods of time. Move your legs often if you must stand in one place for a long time.  Avoid heavy lifting.  Wear low-heeled shoes and practice good posture.  You may continue to have sex unless your health care provider tells you not to. Relieving pain and discomfort  Wear a good support bra to relieve breast tenderness.  Take warm sitz baths to soothe any pain or discomfort caused by hemorrhoids. Use hemorrhoid cream if your health care provider approves.  Rest with your legs elevated if you have leg   cramps or low back pain.  If you develop varicose veins in your legs, wear support hose. Elevate your feet for 15 minutes, 3-4 times a day. Limit salt in your diet. Prenatal care  Schedule your prenatal visits by the twelfth week of pregnancy. They are usually scheduled monthly at first, then more often in the last 2 months before delivery.  Write down your questions. Take them to  your prenatal visits.  Keep all your prenatal visits as told by your health care provider. This is important. Safety  Wear your seat belt at all times when driving.  Make a list of emergency phone numbers, including numbers for family, friends, the hospital, and police and fire departments. General instructions  Ask your health care provider for a referral to a local prenatal education class. Begin classes no later than the beginning of month 6 of your pregnancy.  Ask for help if you have counseling or nutritional needs during pregnancy. Your health care provider can offer advice or refer you to specialists for help with various needs.  Do not use hot tubs, steam rooms, or saunas.  Do not douche or use tampons or scented sanitary pads.  Do not cross your legs for long periods of time.  Avoid cat litter boxes and soil used by cats. These carry germs that can cause birth defects in the baby and possibly loss of the fetus by miscarriage or stillbirth.  Avoid all smoking, herbs, alcohol, and medicines not prescribed by your health care provider. Chemicals in these products affect the formation and growth of the baby.  Do not use any products that contain nicotine or tobacco, such as cigarettes and e-cigarettes. If you need help quitting, ask your health care provider. You may receive counseling support and other resources to help you quit.  Schedule a dentist appointment. At home, brush your teeth with a soft toothbrush and be gentle when you floss. Contact a health care provider if:  You have dizziness.  You have mild pelvic cramps, pelvic pressure, or nagging pain in the abdominal area.  You have persistent nausea, vomiting, or diarrhea.  You have a bad smelling vaginal discharge.  You have pain when you urinate.  You notice increased swelling in your face, hands, legs, or ankles.  You are exposed to fifth disease or chickenpox.  You are exposed to German measles (rubella)  and have never had it. Get help right away if:  You have a fever.  You are leaking fluid from your vagina.  You have spotting or bleeding from your vagina.  You have severe abdominal cramping or pain.  You have rapid weight gain or loss.  You vomit blood or material that looks like coffee grounds.  You develop a severe headache.  You have shortness of breath.  You have any kind of trauma, such as from a fall or a car accident. Summary  The first trimester of pregnancy is from week 1 until the end of week 13 (months 1 through 3).  Your body goes through many changes during pregnancy. The changes vary from woman to woman.  You will have routine prenatal visits. During those visits, your health care provider will examine you, discuss any test results you may have, and talk with you about how you are feeling. This information is not intended to replace advice given to you by your health care provider. Make sure you discuss any questions you have with your health care provider. Document Released: 06/10/2001 Document Revised: 05/28/2016 Document   Reviewed: 05/28/2016 Elsevier Interactive Patient Education  2018 [employer]. Commonly Asked Questions During Pregnancy  Cats: A parasite can be excreted in cat feces.  To avoid exposure you need to have another person empty the little box.  If you must empty the litter box you will need to wear gloves.  Wash your hands after handling your cat.  This parasite can also be found in raw or undercooked meat so this should also be avoided.  Colds, Sore Throats, Flu: Please check your medication sheet to see what you can take for symptoms.  If your symptoms are unrelieved by these medications please call the office.  Dental Work: Most any dental work [employer] recommends is permitted.  X-rays should only be taken during the first trimester if absolutely necessary.  Your abdomen should be shielded with a lead apron during all x-rays.  Please  notify your provider prior to receiving any x-rays.  Novocaine is fine; gas is not recommended.  If your dentist requires a note from [place] prior to dental work please call the office and we will provide one for you.  Exercise: Exercise is an important part of staying healthy during your pregnancy.  You may continue most exercises you were accustomed to prior to pregnancy.  Later in your pregnancy you will most likely notice you have difficulty with activities requiring balance like riding a bicycle.  It is important that you listen to your body and avoid activities that put you at a higher risk of falling.  Adequate rest and staying well hydrated are a must!  If you have questions about the safety of specific activities ask your provider.    Exposure to Children with illness: Try to avoid obvious exposure; report any symptoms to [place] when noted,  If you have chicken pos, red measles or mumps, you should be immune to these diseases.   Please do not take any vaccines while pregnant unless you have checked with your OB provider.  Fetal Movement: After 28 weeks we recommend you do "kick counts" twice daily.  Lie or sit down in a calm quiet environment and count your baby movements "kicks".  You should feel your baby at least 10 times per hour.  If you have not felt 10 kicks within the first hour get up, walk around and have something sweet to eat or drink then repeat for an additional hour.  If count remains less than 10 per hour notify your provider.  Fumigating: Follow your pest control agent's advice as to how long to stay out of your home.  Ventilate the area well before re-entering.  Hemorrhoids:   Most over-the-counter preparations can be used during pregnancy.  Check your medication to see what is safe to use.  It is important to use a stool softener or fiber in your diet and to drink lots of liquids.  If hemorrhoids seem to be getting worse please call the office.   Hot Tubs:  Hot tubs Jacuzzis and  saunas are not recommended while pregnant.  These increase your internal body temperature and should be avoided.  Intercourse:  Sexual intercourse is safe during pregnancy as long as you are comfortable, unless otherwise advised by your provider.  Spotting may occur after intercourse; report any bright red bleeding that is heavier than spotting.  Labor:  If you know that you are in labor, please go to the hospital.  If you are unsure, please call the office and let us help you decide what to  do.  Lifting, straining, etc:  If your job requires heavy lifting or straining please check with your provider for any limitations.  Generally, you should not lift items heavier than that you can lift simply with your hands and arms (no back muscles)  Painting:  Paint fumes do not harm your pregnancy, but may make you ill and should be avoided if possible.  Latex or water based paints have less odor than oils.  Use adequate ventilation while painting.  Permanents & Hair Color:  Chemicals in hair dyes are not recommended as they cause increase hair dryness which can increase hair loss during pregnancy.  " Highlighting" and permanents are allowed.  Dye may be absorbed differently and permanents may not hold as well during pregnancy.  Sunbathing:  Use a sunscreen, as skin burns easily during pregnancy.  Drink plenty of fluids; avoid over heating.  Tanning Beds:  Because their possible side effects are still unknown, tanning beds are not recommended.  Ultrasound Scans:  Routine ultrasounds are performed at approximately 20 weeks.  You will be able to see your baby's general anatomy an if you would like to know the gender this can usually be determined as well.  If it is questionable when you conceived you may also receive an ultrasound early in your pregnancy for dating purposes.  Otherwise ultrasound exams are not routinely performed unless there is a medical necessity.  Although you can request a scan we ask that  you pay for it when conducted because insurance does not cover " patient request" scans.  Work: If your pregnancy proceeds without complications you may work until your due date, unless your physician or employer advises otherwise.  Round Ligament Pain/Pelvic Discomfort:  Sharp, shooting pains not associated with bleeding are fairly common, usually occurring in the second trimester of pregnancy.  They tend to be worse when standing up or when you remain standing for long periods of time.  These are the result of pressure of certain pelvic ligaments called "round ligaments".  Rest, Tylenol and heat seem to be the most effective relief.  As the womb and fetus grow, they rise out of the pelvis and the discomfort improves.  Please notify the office if your pain seems different than that described.  It may represent a more serious condition.  Common Medications Safe in Pregnancy  Acne:      Constipation:  Benzoyl Peroxide     Colace  Clindamycin      Dulcolax Suppository  Topica Erythromycin     Fibercon  Salicylic Acid      Metamucil         Miralax AVOID:        Senakot   Accutane    Cough:  Retin-A       Cough Drops  Tetracycline      Phenergan w/ Codeine if Rx  Minocycline      Robitussin (Plain & DM)  Antibiotics:     Crabs/Lice:  Ceclor       RID  Cephalosporins    AVOID:  E-Mycins      Kwell  Keflex  Macrobid/Macrodantin   Diarrhea:  Penicillin      Kao-Pectate  Zithromax      Imodium AD         PUSH FLUIDS AVOID:       Cipro     Fever:  Tetracycline      Tylenol (Regular or Extra  Minocycline       Strength)  Levaquin      Extra Strength-Do not          Exceed 8 tabs/24 hrs Caffeine:        <200mg/day (equiv. To 1 cup of coffee or  approx. 3 12 oz sodas)         Gas: Cold/Hayfever:       Gas-X  Benadryl      Mylicon  Claritin       Phazyme  **Claritin-D        Chlor-Trimeton    Headaches:  Dimetapp      ASA-Free Excedrin  Drixoral-Non-Drowsy     Cold Compress  Mucinex  (Guaifenasin)     Tylenol (Regular or Extra  Sudafed/Sudafed-12 Hour     Strength)  **Sudafed PE Pseudoephedrine   Tylenol Cold & Sinus     Vicks Vapor Rub  Zyrtec  **AVOID if Problems With Blood Pressure         Heartburn: Avoid lying down for at least 1 hour after meals  Aciphex      Maalox     Rash:  Milk of Magnesia     Benadryl    Mylanta       1% Hydrocortisone Cream  Pepcid  Pepcid Complete   Sleep Aids:  Prevacid      Ambien   Prilosec       Benadryl  Rolaids       Chamomile Tea  Tums (Limit 4/day)     Unisom  Zantac       Tylenol PM         Warm milk-add vanilla or  Hemorrhoids:       Sugar for taste  Anusol/Anusol H.C.  (RX: Analapram 2.5%)  Sugar Substitutes:  Hydrocortisone OTC     Ok in moderation  Preparation H      Tucks        Vaseline lotion applied to tissue with wiping    Herpes:     Throat:  Acyclovir      Oragel  Famvir  Valtrex     Vaccines:         Flu Shot Leg Cramps:       *Gardasil  Benadryl      Hepatitis A         Hepatitis B Nasal Spray:       Pneumovax  Saline Nasal Spray     Polio Booster         Tetanus Nausea:       Tuberculosis test or PPD  Vitamin B6 25 mg TID   AVOID:    Dramamine      *Gardasil  Emetrol       Live Poliovirus  Ginger Root 250 mg QID    MMR (measles, mumps &  High Complex Carbs @ Bedtime    rebella)  Sea Bands-Accupressure    Varicella (Chickenpox)  Unisom 1/2 tab TID     *No known complications           If received before Pain:         Known pregnancy;   Darvocet       Resume series after  Lortab        Delivery  Percocet    Yeast:   Tramadol      Femstat  Tylenol 3      Gyne-lotrimin  Ultram       Monistat  Vicodin           MISC:           All Sunscreens           Hair Coloring/highlights          Insect Repellant's          (Including DEET)         [facility]  [place], [place], [place] [address]  Phone: [phone]   [facility] (second location)  [address] [place], [place] [address]  Phone: [phone]   [facility]) [place], [place], [place] [address] Phone: [phone]   [facility] [place], [place] [address]  Phone: [phone]

## 2018-02-15 NOTE — Progress Notes (Signed)
I have reviewed the record and concur with patient management and plan.  Elmus Mathes, MD [facility]     

## 2018-02-16 LAB — URINALYSIS, ROUTINE W REFLEX MICROSCOPIC
Bilirubin, UA: NEGATIVE
Glucose, UA: NEGATIVE
LEUKOCYTES UA: NEGATIVE
Nitrite, UA: NEGATIVE
PROTEIN UA: NEGATIVE
UUROB: 0.2 mg/dL (ref 0.2–1.0)
pH, UA: 5.5 (ref 5.0–7.5)

## 2018-02-16 LAB — MICROSCOPIC EXAMINATION
Bacteria, UA: NONE SEEN
Casts: NONE SEEN /lpf
Epithelial Cells (non renal): >10 /hpf — AB (ref 0–10)

## 2018-02-16 LAB — ABO AND RH: RH TYPE: POSITIVE

## 2018-02-16 LAB — GC/CHLAMYDIA PROBE AMP
CHLAMYDIA, DNA PROBE: NEGATIVE
Neisseria gonorrhoeae by PCR: NEGATIVE

## 2018-02-17 LAB — NICOTINE SCREEN, URINE: Cotinine Ql Scrn, Ur: NEGATIVE ng/mL

## 2018-02-17 LAB — DRUG PROFILE, UR, 9 DRUGS (LABCORP)
AMPHETAMINES, URINE: NEGATIVE ng/mL
BENZODIAZEPINE QUANT UR: NEGATIVE ng/mL
Barbiturate Quant, Ur: NEGATIVE ng/mL
CANNABINOID QUANT UR: NEGATIVE ng/mL
COCAINE (METAB.): NEGATIVE ng/mL
Methadone Screen, Urine: NEGATIVE ng/mL
Opiate Quant, Ur: NEGATIVE ng/mL
PCP Quant, Ur: NEGATIVE ng/mL
Propoxyphene: NEGATIVE ng/mL

## 2018-02-17 LAB — URINE CULTURE, OB REFLEX

## 2018-02-17 LAB — CULTURE, OB URINE

## 2018-02-18 LAB — CBC WITH DIFFERENTIAL/PLATELET
Basophils Absolute: 0 10*3/uL (ref 0.0–0.2)
Basos: 0 %
EOS (ABSOLUTE): 0.1 10*3/uL (ref 0.0–0.4)
Eos: 1 %
Hematocrit: 38.3 % (ref 34.0–46.6)
Hemoglobin: 12.7 g/dL (ref 11.1–15.9)
IMMATURE GRANS (ABS): 0 10*3/uL (ref 0.0–0.1)
IMMATURE GRANULOCYTES: 0 %
LYMPHS: 20 %
Lymphocytes Absolute: 2 10*3/uL (ref 0.7–3.1)
MCH: 26.5 pg — ABNORMAL LOW (ref 26.6–33.0)
MCHC: 33.2 g/dL (ref 31.5–35.7)
MCV: 80 fL (ref 79–97)
Monocytes Absolute: 0.6 10*3/uL (ref 0.1–0.9)
Monocytes: 6 %
Neutrophils Absolute: 7.4 10*3/uL — ABNORMAL HIGH (ref 1.4–7.0)
Neutrophils: 73 %
PLATELETS: 292 10*3/uL (ref 150–450)
RBC: 4.79 x10E6/uL (ref 3.77–5.28)
RDW: 13.6 % (ref 12.3–15.4)
WBC: 10 10*3/uL (ref 3.4–10.8)

## 2018-02-18 LAB — RPR: RPR: NONREACTIVE

## 2018-02-18 LAB — VARICELLA ZOSTER ANTIBODY, IGG: Varicella zoster IgG: <135 index — ABNORMAL LOW (ref >165)

## 2018-02-18 LAB — HIV ANTIBODY (ROUTINE TESTING W REFLEX): HIV Screen 4th Generation wRfx: NONREACTIVE

## 2018-02-18 LAB — HEMOGLOBIN A1C
Est. average glucose Bld gHb Est-mCnc: 100 mg/dL
Hgb A1c MFr Bld: 5.1 % (ref 4.8–5.6)

## 2018-02-18 LAB — AB SCR+ANTIBODY ID

## 2018-02-18 LAB — TSH: TSH: 0.801 u[IU]/mL (ref 0.450–4.500)

## 2018-02-18 LAB — HEPATITIS B SURFACE ANTIGEN: HEP B S AG: NEGATIVE

## 2018-02-18 LAB — ANTIBODY SCREEN

## 2018-02-18 LAB — RUBELLA SCREEN: RUBELLA: 1.02 {index} (ref >0.99)

## 2018-02-21 ENCOUNTER — Encounter: Payer: Self-pay | Admitting: Obstetrics and Gynecology

## 2018-02-21 DIAGNOSIS — O09899 Supervision of other high risk pregnancies, unspecified trimester: Secondary | ICD-10-CM | POA: Insufficient documentation

## 2018-02-21 DIAGNOSIS — Z2839 Other underimmunization status: Secondary | ICD-10-CM | POA: Insufficient documentation

## 2018-02-21 DIAGNOSIS — Z283 Underimmunization status: Secondary | ICD-10-CM

## 2018-02-26 ENCOUNTER — Encounter: Payer: Self-pay | Admitting: Obstetrics and Gynecology

## 2018-02-26 ENCOUNTER — Ambulatory Visit (INDEPENDENT_AMBULATORY_CARE_PROVIDER_SITE_OTHER): Payer: BLUE CROSS/BLUE SHIELD | Admitting: Obstetrics and Gynecology

## 2018-02-26 VITALS — BP 128/82 | HR 103 | Wt 222.4 lb

## 2018-02-26 DIAGNOSIS — I951 Orthostatic hypotension: Secondary | ICD-10-CM

## 2018-02-26 DIAGNOSIS — R000 Tachycardia, unspecified: Secondary | ICD-10-CM

## 2018-02-26 DIAGNOSIS — O09619 Supervision of young primigravida, unspecified trimester: Secondary | ICD-10-CM

## 2018-02-26 DIAGNOSIS — Q742 Other congenital malformations of lower limb(s), including pelvic girdle: Secondary | ICD-10-CM | POA: Insufficient documentation

## 2018-02-26 DIAGNOSIS — Z283 Underimmunization status: Secondary | ICD-10-CM

## 2018-02-26 DIAGNOSIS — G90A Postural orthostatic tachycardia syndrome (POTS): Secondary | ICD-10-CM | POA: Insufficient documentation

## 2018-02-26 DIAGNOSIS — O219 Vomiting of pregnancy, unspecified: Secondary | ICD-10-CM | POA: Insufficient documentation

## 2018-02-26 DIAGNOSIS — O09899 Supervision of other high risk pregnancies, unspecified trimester: Secondary | ICD-10-CM

## 2018-02-26 LAB — POCT URINALYSIS DIPSTICK OB
Bilirubin, UA: NEGATIVE
GLUCOSE, UA: NEGATIVE
Ketones, UA: NEGATIVE
LEUKOCYTES UA: NEGATIVE
NITRITE UA: NEGATIVE
PROTEIN: NEGATIVE
Urobilinogen, UA: 0.2 E.U./dL
pH, UA: 5 (ref 5.0–8.0)

## 2018-02-26 MED ORDER — ASPIRIN EC 81 MG PO TBEC: 81.0000 mg | DELAYED_RELEASE_TABLET | Freq: Every day | ORAL | 2 refills | Status: DC

## 2018-02-26 NOTE — Progress Notes (Signed)
OBSTETRIC INITIAL PRENATAL VISIT  Subjective:    Ashley Strickland is being seen today for her first obstetrical visit.  This is not a planned pregnancy. She is a G1P0 female at [redacted]w[redacted]d gestation, Estimated Date of Delivery: 09/15/18 with Patient's last menstrual period was 12/09/2017 (approximate) inconsistent with 10 week sono). Her obstetrical history is significant for obesity and POTS syndrome. Relationship with FOB: significant other, not living together. Patient does intend to breast feed. Pregnancy history fully reviewed.  Pregnancy currently complicated by nausea and vomiting of pregnancy, and some dizziness.  Was prescribed Zofran by her PCP, however notes that it wasn't working as she could not keep the pills down.  Was recently just prescribed the dissolvable kind starting yesterday.   OB History  Gravida Para Term Preterm AB Living  1 0 0 0 0 0  SAB TAB Ectopic Multiple Live Births  0 0 0 0 0    # Outcome Date GA Lbr Len/2nd Weight Sex Delivery Anes PTL Lv  1 Current             Gynecologic History:  Last pap smear was: Patient has never had a pap smear.   Denies history of STIs.  Previous birth control: OCPs.  Stopped 1 month prior to pregnancy as she was planning to change to a different form of contraception.    Past Medical History:  Diagnosis Date  . Anemia   . Anxiety   . Constipation   . Dermatitis   . Joint pain   . Orthostatic hypotension   . Personal history of other mental and behavioral disorders   . Pott's disease   . Vitamin D deficiency      Family History  Problem Relation Age of Onset  . Diabetes Neg Hx   . CAD Neg Hx   . Cancer - Other Neg Hx      Past Surgical History:  Procedure Laterality Date  . ADENOIDECTOMY       Social History   Socioeconomic History  . Marital status: Single    Spouse name: Not on file  . Number of children: Not on file  . Years of education: Not on file  . Highest education level: Not on file    Occupational History  . Not on file  Social Needs  . Financial resource strain: Not on file  . Food insecurity:    Worry: Not on file    Inability: Not on file  . Transportation needs:    Medical: Not on file    Non-medical: Not on file  Tobacco Use  . Smoking status: Never Smoker  . Smokeless tobacco: Never Used  Substance and Sexual Activity  . Alcohol use: No  . Drug use: No  . Sexual activity: Yes    Birth control/protection: None  Lifestyle  . Physical activity:    Days per week: Not on file    Minutes per session: Not on file  . Stress: Not on file  Relationships  . Social connections:    Talks on phone: Not on file    Gets together: Not on file    Attends religious service: Not on file    Active member of club or organization: Not on file    Attends meetings of clubs or organizations: Not on file    Relationship status: Not on file  . Intimate partner violence:    Fear of current or ex partner: Not on file    Emotionally abused: Not on  file    Physically abused: Not on file    Forced sexual activity: Not on file  Other Topics Concern  . Not on file  Social History Narrative  . Not on file     Current Outpatient Medications on File Prior to Visit  Medication Sig Dispense Refill  . ondansetron (ZOFRAN ODT) 4 MG disintegrating tablet Take 1 tablet (4 mg total) by mouth every 8 (eight) hours as needed for nausea or vomiting. 15 tablet 0  . Prenatal Vit-Fe Fumarate-FA (PRENATAL MULTIVITAMIN) TABS tablet Take 1 tablet by mouth daily at 12 noon.     No current facility-administered medications on file prior to visit.      Allergies  Allergen Reactions  . Phenergan [Promethazine Hcl] Other (See Comments)    Seizure      Review of Systems General:Not Present- Fever, Weight Loss and Weight Gain. Skin:Not Present- Rash. HEENT:Not Present- Blurred Vision, Headache and Bleeding Gums. Respiratory:Not Present- Difficulty Breathing. Breast:Not Present-  Breast Mass. Cardiovascular:Not Present- Chest Pain, Elevated Blood Pressure, Fainting / Blacking Out and Shortness of Breath. Gastrointestinal:Not Present- Abdominal Pain, Constipation.  Present - Nausea and Vomiting. Female Genitourinary:Not Present- Frequency, Painful Urination, Pelvic Pain, Vaginal Bleeding, Vaginal Discharge, Contractions, regular, Fetal Movements Decreased, Urinary Complaints and Vaginal Fluid. Musculoskeletal:Not Present- Back Pain and Leg Cramps. Neurological:Not Present- Headaches, Loss of Consciousness.  Present - Dizziness. Psychiatric:Not Present- Depression.     Objective:   Blood pressure 128/82, pulse (!) 103, weight 222 lb 6.4 oz (100.9 kg), last menstrual period 12/09/2017.  Body mass index is 42.02 kg/m.   General Appearance:    Alert, cooperative, no distress, appears stated age, morbid obesity  Head:    Normocephalic, without obvious abnormality, atraumatic  Eyes:    PERRL, conjunctiva/corneas clear, EOM's intact, both eyes  Ears:    Normal external ear canals, both ears  Nose:   Nares normal, septum midline, mucosa normal, no drainage or sinus tenderness  Throat:   Lips, mucosa, and tongue normal; teeth and gums normal  Neck:   Supple, symmetrical, trachea midline, no adenopathy; thyroid: no enlargement/tenderness/nodules; no carotid bruit or JVD  Back:     Symmetric, no curvature, ROM normal, no CVA tenderness  Lungs:     Clear to auscultation bilaterally, respirations unlabored  Chest Wall:    No tenderness or deformity   Heart:    Regular rate and rhythm, S1 and S2 normal, no murmur, rub or gallop  Breast Exam:    No tenderness, masses, or nipple abnormality  Abdomen:     Soft, non-tender, bowel sounds active all four quadrants, no masses, no organomegaly.    FHT 168 bpm.  Genitalia:    Pelvic:external genitalia normal, vagina without lesions, discharge, or tenderness, rectovaginal septum  Normal. Prominent pubic bone, decreased arch.  Cervix  normal in appearance, no cervical motion tenderness, no adnexal masses or tenderness.  Pregnancy positive findings: uterine enlargement: 11 wk size, nontender.   Rectal:    Normal external sphincter.  No hemorrhoids appreciated. Internal exam not done.   Extremities:   Extremities normal, atraumatic, no cyanosis or edema  Pulses:   2+ and symmetric all extremities  Skin:   Skin color, texture, turgor normal, no rashes or lesions  Lymph nodes:   Cervical, supraclavicular, and axillary nodes normal  Neurologic:   CNII-XII intact, normal strength, sensation and reflexes throughout       Assessment:    Pregnancy at 11 and 2/7 weeks   Morbid obesity  POTS  syndrome  Nausea and vomiting of pregnancy Varicella non-immune  Prominent pubic bone  Plan:  1. Pregnancy at 11 and 2/7 weeks - Initial labs reviewed.  Varicella non-immune. Will need vaccination postpartum.  - Prenatal vitamins encouraged. - Problem list reviewed and updated. - New OB counseling:  The patient has been given an overview regarding routine prenatal care.   - Prenatal testing, optional genetic testing, and ultrasound use in pregnancy were reviewed.  Patient declines genetic testing.  - Benefits of Breast Feeding were discussed. The patient is encouraged to consider nursing her baby post partum.  2. Morbid obesity  - Recommendations regarding diet, weight gain, and exercise in pregnancy were given. Advised to gain no more than 15-20 lbs in pregnancy.  To discuss anesthesia cut off if BMI > 45.  - Will need early glucola starting next visit due to morbid obesity.  - WIll also need to begin daily begin daily baby aspirin as she has 2 moderate risk factors (obesity and nulliparity).   3. POTS syndrome - Will continue to manage expectantly. Counseled on maintaining adequate hydration, caution when changing positions, and potentially increasing sodium in diet.   4. Nausea and vomiting of pregnancy  - Just recently started  on Zofran ODT (previously on regular tablets but was unable to tolerate). To notify MD if symptoms persist despite treatment.   5. Prominent pubic bone - Patient's mother concerned as she had a narrow pelvis and had difficulty pushing her daughter out requiring a C-section after several days of labor. Patient with prominent pubic bone with flat arch, but dose have room posteriorly. Discussed that there may be some concern with vaginal delivery if patient has a large sized baby, however if average sized, should be ok.   Follow up in 4 weeks.  50% of 30 min visit spent on counseling and coordination of care.     Hildred Laserherry, Sheray Grist, MD [facility]

## 2018-02-26 NOTE — Patient Instructions (Signed)
Second Trimester of Pregnancy The second trimester is from week 13 through week 28, month 4 through 6. This is often the time in pregnancy that you feel your best. Often times, morning sickness has lessened or quit. You may have more energy, and you may get hungry more often. Your unborn baby (fetus) is growing rapidly. At the end of the sixth month, he or she is about 9 inches long and weighs about 1 pounds. You will likely feel the baby move (quickening) between 18 and 20 weeks of pregnancy. Follow these instructions at home:  Avoid all smoking, herbs, and alcohol. Avoid drugs not approved by your doctor.  Do not use any tobacco products, including cigarettes, chewing tobacco, and electronic cigarettes. If you need help quitting, ask your doctor. You may get counseling or other support to help you quit.  Only take medicine as told by your doctor. Some medicines are safe and some are not during pregnancy.  Exercise only as told by your doctor. Stop exercising if you start having cramps.  Eat regular, healthy meals.  Wear a good support bra if your breasts are tender.  Do not use hot tubs, steam rooms, or saunas.  Wear your seat belt when driving.  Avoid raw meat, uncooked cheese, and liter boxes and soil used by cats.  Take your prenatal vitamins.  Take 1500-2000 milligrams of calcium daily starting at the 20th week of pregnancy until you deliver your baby.  Try taking medicine that helps you poop (stool softener) as needed, and if your doctor approves. Eat more fiber by eating fresh fruit, vegetables, and whole grains. Drink enough fluids to keep your pee (urine) clear or pale yellow.  Take warm water baths (sitz baths) to soothe pain or discomfort caused by hemorrhoids. Use hemorrhoid cream if your doctor approves.  If you have puffy, bulging veins (varicose veins), wear support hose. Raise (elevate) your feet for 15 minutes, 3-4 times a day. Limit salt in your diet.  Avoid heavy  lifting, wear low heals, and sit up straight.  Rest with your legs raised if you have leg cramps or low back pain.  Visit your dentist if you have not gone during your pregnancy. Use a soft toothbrush to brush your teeth. Be gentle when you floss.  You can have sex (intercourse) unless your doctor tells you not to.  Go to your doctor visits. Get help if:  You feel dizzy.  You have mild cramps or pressure in your lower belly (abdomen).  You have a nagging pain in your belly area.  You continue to feel sick to your stomach (nauseous), throw up (vomit), or have watery poop (diarrhea).  You have bad smelling fluid coming from your vagina.  You have pain with peeing (urination). Get help right away if:  You have a fever.  You are leaking fluid from your vagina.  You have spotting or bleeding from your vagina.  You have severe belly cramping or pain.  You lose or gain weight rapidly.  You have trouble catching your breath and have chest pain.  You notice sudden or extreme puffiness (swelling) of your face, hands, ankles, feet, or legs.  You have not felt the baby move in over an hour.  You have severe headaches that do not go away with medicine.  You have vision changes. This information is not intended to replace advice given to you by your health care provider. Make sure you discuss any questions you have with your health care   provider. Document Released: 09/10/2009 Document Revised: 11/22/2015 Document Reviewed: 08/17/2012 Elsevier Interactive Patient Education  2017 [employer] Inc[employer]  

## 2018-02-26 NOTE — Progress Notes (Signed)
ROB-Pt present today for NOB PE pt stated that she has been dizzy, vomiting and having morning sickness and is unable to drive due to the dizziness. Pt stated having sharp lower abd pain that comes and goes. Pt noticed pain at the start of the pregnancy.

## 2018-03-03 ENCOUNTER — Encounter: Payer: Self-pay | Admitting: Obstetrics and Gynecology

## 2018-03-03 ENCOUNTER — Ambulatory Visit (INDEPENDENT_AMBULATORY_CARE_PROVIDER_SITE_OTHER): Payer: BLUE CROSS/BLUE SHIELD | Admitting: Obstetrics and Gynecology

## 2018-03-03 VITALS — BP 93/69 | HR 112 | Ht 61.0 in | Wt 219.8 lb

## 2018-03-03 DIAGNOSIS — Z3A12 12 weeks gestation of pregnancy: Secondary | ICD-10-CM | POA: Diagnosis not present

## 2018-03-03 DIAGNOSIS — Z331 Pregnant state, incidental: Secondary | ICD-10-CM

## 2018-03-03 DIAGNOSIS — B711 Dipylidiasis: Secondary | ICD-10-CM

## 2018-03-03 DIAGNOSIS — Z3401 Encounter for supervision of normal first pregnancy, first trimester: Secondary | ICD-10-CM

## 2018-03-03 DIAGNOSIS — B674 Echinococcus granulosus infection, unspecified: Secondary | ICD-10-CM

## 2018-03-03 LAB — POCT URINALYSIS DIPSTICK OB
Bilirubin, UA: NEGATIVE
Glucose, UA: NEGATIVE
LEUKOCYTES UA: NEGATIVE
NITRITE UA: NEGATIVE
RBC UA: NEGATIVE
Urobilinogen, UA: 0.2 E.U./dL
pH, UA: 6 (ref 5.0–8.0)

## 2018-03-03 NOTE — Progress Notes (Signed)
Ashley Strickland stated that she got a new puppy and the puppy has tapeworms. Pt is scared that she has a tapeworm.

## 2018-03-03 NOTE — Progress Notes (Signed)
Problem OB visit:    Subjective:    Ashley Strickland is a 22 y.o. female being seen today for a problem obstetrical visit. She is at [redacted]w[redacted]d gestation. Patient reports that she got a new puppy last week, who was recently diagnosed with having tapeworms.  Patient worried now that she is newly pregnant that she may also be exposed and have a tapeworm. Currently has no GI symptoms, no changes in stools, bloody stools, or visualization of worms in stools; also denies fevers, chills, or nausea/vomiting that has worsened beyond pregnancy symptoms.   Menstrual History: OB History  Gravida Para Term Preterm AB Living  1            SAB TAB Ectopic Multiple Live Births               # Outcome Date GA Lbr Len/2nd Weight Sex Delivery Anes PTL Lv  1 Current              The following portions of the patient's history were reviewed and updated as appropriate: allergies, current medications, past family history, past medical history, past social history, past surgical history and problem list.  Review of Systems Pertinent items noted in HPI and remainder of comprehensive ROS otherwise negative.   Objective:     BP 93/69   Pulse (!) 112   Ht 5\' 1"  (1.549 m)   Wt 219 lb 12.8 oz (99.7 kg)   LMP 12/09/2017 (Approximate)   BMI 41.53 kg/m  Uterine Size: size equals dates  Abdomen: Soft, non-tender, no masses or organomegaly  FHT: 157 bpm  Pelvic Exam: Deferred.      Assessment:    Pregnancy 12 and 0/7 weeks   Dog with tapeworm infection  Plan:    Problem list reviewed and updated. Discussion had with patient she likely was not infected with tapeworms as she has not noticed any changes in stools, and usually the eggs are ingested, so as long as she has practiced good hand hygiene when dealing with her dog, she likely does not have tapeworms.  Patient still very anxious and would like to be tested. Given vials for specimen collection, informed to bring specimen in sometime next week as she is  still fairly early with exposure.  RTC in 3 weeks for routine OB appointment.    Hildred Laser, MD [facility]

## 2018-03-09 ENCOUNTER — Encounter: Payer: BLUE CROSS/BLUE SHIELD | Admitting: Obstetrics and Gynecology

## 2018-03-11 NOTE — Addendum Note (Signed)
Addended by: Smith MinceGWYNN, Xandra Laramee J on: 03/11/2018 03:33 PM   Modules accepted: Orders

## 2018-03-14 LAB — OVA AND PARASITE EXAMINATION

## 2018-03-15 LAB — STOOL CULTURE: E coli, Shiga toxin Assay: NEGATIVE

## 2018-03-24 ENCOUNTER — Telehealth: Payer: Self-pay

## 2018-03-24 NOTE — Telephone Encounter (Signed)
Pt called complaining of back pain. Pt not experiencing any other symptoms. Advised pt to hydrate, rest and try tylenol. If pain worsens, pt is to call back for further instructions.

## 2018-03-26 NOTE — Progress Notes (Deleted)
Pt presents today for ROB and early 1 hr glucola

## 2018-03-29 ENCOUNTER — Other Ambulatory Visit: Payer: BLUE CROSS/BLUE SHIELD

## 2018-03-29 ENCOUNTER — Encounter: Payer: BLUE CROSS/BLUE SHIELD | Admitting: Obstetrics and Gynecology

## 2018-03-30 ENCOUNTER — Ambulatory Visit (INDEPENDENT_AMBULATORY_CARE_PROVIDER_SITE_OTHER): Payer: BLUE CROSS/BLUE SHIELD | Admitting: Obstetrics and Gynecology

## 2018-03-30 ENCOUNTER — Other Ambulatory Visit: Payer: BLUE CROSS/BLUE SHIELD

## 2018-03-30 ENCOUNTER — Encounter: Payer: Self-pay | Admitting: Obstetrics and Gynecology

## 2018-03-30 VITALS — BP 122/78 | HR 99 | Wt 223.0 lb

## 2018-03-30 DIAGNOSIS — O99212 Obesity complicating pregnancy, second trimester: Secondary | ICD-10-CM

## 2018-03-30 DIAGNOSIS — Z3402 Encounter for supervision of normal first pregnancy, second trimester: Secondary | ICD-10-CM

## 2018-03-30 LAB — POCT URINALYSIS DIPSTICK OB
Bilirubin, UA: NEGATIVE
Blood, UA: NEGATIVE
GLUCOSE, UA: NEGATIVE
KETONES UA: NEGATIVE
Leukocytes, UA: NEGATIVE
NITRITE UA: NEGATIVE
PROTEIN: NEGATIVE
SPEC GRAV UA: 1.015 (ref 1.010–1.025)
Urobilinogen, UA: 0.2 E.U./dL
pH, UA: 7.5 (ref 5.0–8.0)

## 2018-03-30 NOTE — Progress Notes (Signed)
Pt presents today for ROB and early 1hr gt. Pt states she thinks she has hemorrhoids. Otherwise pt is feeling well.

## 2018-03-30 NOTE — Progress Notes (Signed)
ROB: 1 hour GCT today, patient desires quad screen too.  Complains of hemorrhoids-discussed keeping stool soft and use of hemorrhoidal cream.

## 2018-03-31 LAB — GLUCOSE, 1 HOUR GESTATIONAL: Gestational Diabetes Screen: 98 mg/dL (ref 65–139)

## 2018-04-01 LAB — AFP TETRA
DIA Mom Value: 0.56
DIA Value (EIA): 78.8 pg/mL
DSR (BY AGE) 1 IN: 1159
DSR (Second Trimester) 1 IN: 10000
Gestational Age: 16 WEEKS
MSAFP Mom: 1.3
MSAFP: 33 ng/mL
MSHCG MOM: 0.65
MSHCG: 22141 m[IU]/mL
Maternal Age At EDD: 20.3 yr
Osb Risk: 4803
T18 (By Age): 1:4517 {titer}
Test Results:: NEGATIVE
UE3 VALUE: 1.05 ng/mL
Weight: 223 [lb_av]
uE3 Mom: 1.39

## 2018-04-13 ENCOUNTER — Other Ambulatory Visit: Payer: Self-pay | Admitting: Obstetrics and Gynecology

## 2018-04-13 DIAGNOSIS — Z3689 Encounter for other specified antenatal screening: Secondary | ICD-10-CM

## 2018-04-27 ENCOUNTER — Encounter: Payer: Self-pay | Admitting: Obstetrics and Gynecology

## 2018-04-27 ENCOUNTER — Ambulatory Visit (INDEPENDENT_AMBULATORY_CARE_PROVIDER_SITE_OTHER): Payer: BLUE CROSS/BLUE SHIELD

## 2018-04-27 ENCOUNTER — Ambulatory Visit (INDEPENDENT_AMBULATORY_CARE_PROVIDER_SITE_OTHER): Payer: BLUE CROSS/BLUE SHIELD | Admitting: Obstetrics and Gynecology

## 2018-04-27 VITALS — BP 105/76 | HR 97 | Wt 227.4 lb

## 2018-04-27 DIAGNOSIS — Z3402 Encounter for supervision of normal first pregnancy, second trimester: Secondary | ICD-10-CM

## 2018-04-27 DIAGNOSIS — O99210 Obesity complicating pregnancy, unspecified trimester: Secondary | ICD-10-CM

## 2018-04-27 DIAGNOSIS — Z3689 Encounter for other specified antenatal screening: Secondary | ICD-10-CM | POA: Diagnosis not present

## 2018-04-27 DIAGNOSIS — Z23 Encounter for immunization: Secondary | ICD-10-CM

## 2018-04-27 LAB — POCT URINALYSIS DIPSTICK OB
Bilirubin, UA: NEGATIVE
GLUCOSE, UA: NEGATIVE
Ketones, UA: NEGATIVE
NITRITE UA: NEGATIVE
RBC UA: NEGATIVE
SPEC GRAV UA: 1.015 (ref 1.010–1.025)
Urobilinogen, UA: 0.2 E.U./dL
pH, UA: 7.5 (ref 5.0–8.0)

## 2018-04-27 NOTE — Patient Instructions (Signed)

## 2018-04-27 NOTE — Progress Notes (Signed)
ROB: Doing well, no complaints. S/p anatomy scan (normal except missing facial profile, to return in 2 weeks for f/u). Patient doing well with monitoring weight gain. Discussion had with patient regarding circumcision for female infant, birthing classes. Flu vaccine given today. RTC in 4 weeks.

## 2018-04-27 NOTE — Progress Notes (Signed)
   ROB-PT stated that she is doing well no complaints. Flu vaccine given today.

## 2018-04-28 ENCOUNTER — Telehealth: Payer: Self-pay | Admitting: Obstetrics and Gynecology

## 2018-04-28 NOTE — Telephone Encounter (Signed)
The patients husband called and stated that he/she needs to speak with a nurse as soon as possible in regards to the patient receiving her flu shot recently and today had a sudden urgency to vomit and have diarrhea, an also may have a slight temperature. The patient is hoping to get a call back before the end of the day if possible. Please advise.

## 2018-04-28 NOTE — Telephone Encounter (Signed)
Pt was called. Pt stated that she was sick, throwing up and had diarrhea. Pt stated that she took a zofran and it helped. Pt was informed that there is a virus going around that she may have got.  Pt was advised to stay hydrated and eat small meals throughout the day. Pt was advised if she continued to have those symptoms tomorrow to please call the office.

## 2018-05-03 NOTE — Telephone Encounter (Signed)
Called to checked on pt to see how she was during since the last time we spoke. Pt stated that she was doing a lot better and she think she had the stomach virus that was going around as well. Pt stated that she was doing a lot better.

## 2018-05-11 ENCOUNTER — Ambulatory Visit (INDEPENDENT_AMBULATORY_CARE_PROVIDER_SITE_OTHER): Payer: BLUE CROSS/BLUE SHIELD

## 2018-05-11 DIAGNOSIS — O99210 Obesity complicating pregnancy, unspecified trimester: Secondary | ICD-10-CM

## 2018-05-11 DIAGNOSIS — Z362 Encounter for other antenatal screening follow-up: Secondary | ICD-10-CM | POA: Diagnosis not present

## 2018-05-11 DIAGNOSIS — Z3402 Encounter for supervision of normal first pregnancy, second trimester: Secondary | ICD-10-CM

## 2018-05-25 ENCOUNTER — Ambulatory Visit (INDEPENDENT_AMBULATORY_CARE_PROVIDER_SITE_OTHER): Payer: BLUE CROSS/BLUE SHIELD | Admitting: Obstetrics and Gynecology

## 2018-05-25 ENCOUNTER — Encounter: Payer: Self-pay | Admitting: Obstetrics and Gynecology

## 2018-05-25 VITALS — BP 104/71 | HR 96 | Wt 232.0 lb

## 2018-05-25 DIAGNOSIS — Z3402 Encounter for supervision of normal first pregnancy, second trimester: Secondary | ICD-10-CM

## 2018-05-25 DIAGNOSIS — O99212 Obesity complicating pregnancy, second trimester: Secondary | ICD-10-CM

## 2018-05-25 LAB — POCT URINALYSIS DIPSTICK OB
BILIRUBIN UA: NEGATIVE
Blood, UA: NEGATIVE
GLUCOSE, UA: NEGATIVE
Ketones, UA: NEGATIVE
Leukocytes, UA: NEGATIVE
Nitrite, UA: NEGATIVE
POC,PROTEIN,UA: NEGATIVE
SPEC GRAV UA: 1.015 (ref 1.010–1.025)
Urobilinogen, UA: 0.2 E.U./dL
pH, UA: 6.5 (ref 5.0–8.0)

## 2018-05-25 MED ORDER — SCOPOLAMINE 1 MG/3DAYS TD PT72: 1.0000 | MEDICATED_PATCH | TRANSDERMAL | 11 refills | Status: DC

## 2018-05-25 NOTE — Progress Notes (Signed)
ROB: Taking aspirin as directed. Complains of pots syndrome worsening.  We discussed management.  Complains of generalized pain in her hips and chest wall.  We have discussed this also.  1 hour GCT next visit.

## 2018-05-25 NOTE — Progress Notes (Signed)
Pt here today for ROB. Pt states she is experiencing a lot of pain and pressure, "everything hurts". Pt has been taking tylenol that only helps some.

## 2018-06-02 ENCOUNTER — Telehealth: Payer: Self-pay | Admitting: Obstetrics and Gynecology

## 2018-06-02 MED ORDER — DOXYLAMINE-PYRIDOXINE 10-10 MG PO TBEC: 10.0000 mg | DELAYED_RELEASE_TABLET | Freq: Every evening | ORAL | 1 refills | Status: DC | PRN

## 2018-06-02 NOTE — Telephone Encounter (Signed)
Pt called due to being sick and vomiting a lot. Was advised to try to eat small meals. Instead of drinking water to try popsicles or ice cubes. Pt was advised to try pedialyte drinks or popsicles to stay hydrated. Pt was advised to try sea band, vit b6, ginger root tea or tablets. Pt stated that she would try those and Diclegis was called in to her pharmacy for her.

## 2018-06-02 NOTE — Telephone Encounter (Signed)
The patient called and stated that she is unable to keep any food down and would like to speak with a nurse of provider as soon as possible, she also stated that her medication is not working that was prescribed for nausea. The patient did not disclose any other information, Please advise.

## 2018-06-11 ENCOUNTER — Telehealth: Payer: Self-pay | Admitting: Obstetrics and Gynecology

## 2018-06-11 MED ORDER — ONDANSETRON 4 MG PO TBDP: 4.0000 mg | ORAL_TABLET | Freq: Three times a day (TID) | ORAL | 1 refills | Status: DC | PRN

## 2018-06-11 NOTE — Telephone Encounter (Signed)
Patient called stating she is out of zofran and needs a refill. She uses [facility] [address].Thanks

## 2018-06-25 ENCOUNTER — Other Ambulatory Visit: Payer: BLUE CROSS/BLUE SHIELD

## 2018-06-25 ENCOUNTER — Ambulatory Visit (INDEPENDENT_AMBULATORY_CARE_PROVIDER_SITE_OTHER): Payer: BLUE CROSS/BLUE SHIELD | Admitting: Obstetrics and Gynecology

## 2018-06-25 VITALS — BP 103/68 | HR 99 | Wt 232.7 lb

## 2018-06-25 DIAGNOSIS — Z23 Encounter for immunization: Secondary | ICD-10-CM | POA: Diagnosis not present

## 2018-06-25 DIAGNOSIS — Z3402 Encounter for supervision of normal first pregnancy, second trimester: Secondary | ICD-10-CM

## 2018-06-25 DIAGNOSIS — O99213 Obesity complicating pregnancy, third trimester: Secondary | ICD-10-CM

## 2018-06-25 DIAGNOSIS — O99210 Obesity complicating pregnancy, unspecified trimester: Secondary | ICD-10-CM

## 2018-06-25 DIAGNOSIS — N949 Unspecified condition associated with female genital organs and menstrual cycle: Secondary | ICD-10-CM

## 2018-06-25 LAB — POCT URINALYSIS DIPSTICK OB
Bilirubin, UA: NEGATIVE
Blood, UA: NEGATIVE
Glucose, UA: NEGATIVE
Ketones, UA: NEGATIVE
LEUKOCYTES UA: NEGATIVE
NITRITE UA: NEGATIVE
Spec Grav, UA: >=1.03 — AB (ref 1.010–1.025)
Urobilinogen, UA: 0.2 E.U./dL
pH, UA: 6 (ref 5.0–8.0)

## 2018-06-25 MED ORDER — TETANUS-DIPHTH-ACELL PERTUSSIS 5-2.5-18.5 LF-MCG/0.5 IM SUSP
0.5000 mL | Freq: Once | INTRAMUSCULAR | Status: AC
  Administered 2018-06-25: 0.5 mL via INTRAMUSCULAR

## 2018-06-25 NOTE — Patient Instructions (Signed)
Third Trimester of Pregnancy The third trimester is from week 28 through week 40 (months 7 through 9). The third trimester is a time when the unborn baby (fetus) is growing rapidly. At the end of the ninth month, the fetus is about 20 inches in length and weighs 6-10 pounds. Body changes during your third trimester Your body will continue to go through many changes during pregnancy. The changes vary from woman to woman. During the third trimester:  Your weight will continue to increase. You can expect to gain 25-35 pounds (11-16 kg) by the end of the pregnancy.  You may begin to get stretch marks on your hips, abdomen, and breasts.  You may urinate more often because the fetus is moving lower into your pelvis and pressing on your bladder.  You may develop or continue to have heartburn. This is caused by increased hormones that slow down muscles in the digestive tract.  You may develop or continue to have constipation because increased hormones slow digestion and cause the muscles that push waste through your intestines to relax.  You may develop hemorrhoids. These are swollen veins (varicose veins) in the rectum that can itch or be painful.  You may develop swollen, bulging veins (varicose veins) in your legs.  You may have increased body aches in the pelvis, back, or thighs. This is due to weight gain and increased hormones that are relaxing your joints.  You may have changes in your hair. These can include thickening of your hair, rapid growth, and changes in texture. Some women also have hair loss during or after pregnancy, or hair that feels dry or thin. Your hair will most likely return to normal after your baby is born.  Your breasts will continue to grow and they will continue to become tender. A yellow fluid (colostrum) may leak from your breasts. This is the first milk you are producing for your baby.  Your belly button may stick out.  You may notice more swelling in your hands,  face, or ankles.  You may have increased tingling or numbness in your hands, arms, and legs. The skin on your belly may also feel numb.  You may feel short of breath because of your expanding uterus.  You may have more problems sleeping. This can be caused by the size of your belly, increased need to urinate, and an increase in your body's metabolism.  You may notice the fetus "dropping," or moving lower in your abdomen (lightening).  You may have increased vaginal discharge.  You may notice your joints feel loose and you may have pain around your pelvic bone. What to expect at prenatal visits You will have prenatal exams every 2 weeks until week 36. Then you will have weekly prenatal exams. During a routine prenatal visit:  You will be weighed to make sure you and the baby are growing normally.  Your blood pressure will be taken.  Your abdomen will be measured to track your baby's growth.  The fetal heartbeat will be listened to.  Any test results from the previous visit will be discussed.  You may have a cervical check near your due date to see if your cervix has softened or thinned (effaced).  You will be tested for Group B streptococcus. This happens between 35 and 37 weeks. Your health care provider may ask you:  What your birth plan is.  How you are feeling.  If you are feeling the baby move.  If you have had any abnormal   symptoms, such as leaking fluid, bleeding, severe headaches, or abdominal cramping.  If you are using any tobacco products, including cigarettes, chewing tobacco, and electronic cigarettes.  If you have any questions. Other tests or screenings that may be performed during your third trimester include:  Blood tests that check for low iron levels (anemia).  Fetal testing to check the health, activity level, and growth of the fetus. Testing is done if you have certain medical conditions or if there are problems during the pregnancy.  Nonstress test  (NST). This test checks the health of your baby to make sure there are no signs of problems, such as the baby not getting enough oxygen. During this test, a belt is placed around your belly. The baby is made to move, and its heart rate is monitored during movement. What is false labor? False labor is a condition in which you feel small, irregular tightenings of the muscles in the womb (contractions) that usually go away with rest, changing position, or drinking water. These are called Braxton Hicks contractions. Contractions may last for hours, days, or even weeks before true labor sets in. If contractions come at regular intervals, become more frequent, increase in intensity, or become painful, you should see your health care provider. What are the signs of labor?  Abdominal cramps.  Regular contractions that start at 10 minutes apart and become stronger and more frequent with time.  Contractions that start on the top of the uterus and spread down to the lower abdomen and back.  Increased pelvic pressure and dull back pain.  A watery or bloody mucus discharge that comes from the vagina.  Leaking of amniotic fluid. This is also known as your "water breaking." It could be a slow trickle or a gush. Let your health care provider know if it has a color or strange odor. If you have any of these signs, call your health care provider right away, even if it is before your due date. Follow these instructions at home: Medicines  Follow your health care provider's instructions regarding medicine use. Specific medicines may be either safe or unsafe to take during pregnancy.  Take a prenatal vitamin that contains at least 600 micrograms (mcg) of folic acid.  If you develop constipation, try taking a stool softener if your health care provider approves. Eating and drinking   Eat a balanced diet that includes fresh fruits and vegetables, whole grains, good sources of protein such as meat, eggs, or tofu,  and low-fat dairy. Your health care provider will help you determine the amount of weight gain that is right for you.  Avoid raw meat and uncooked cheese. These carry germs that can cause birth defects in the baby.  If you have low calcium intake from food, talk to your health care provider about whether you should take a daily calcium supplement.  Eat four or five small meals rather than three large meals a day.  Limit foods that are high in fat and processed sugars, such as fried and sweet foods.  To prevent constipation: ? Drink enough fluid to keep your urine clear or pale yellow. ? Eat foods that are high in fiber, such as fresh fruits and vegetables, whole grains, and beans. Activity  Exercise only as directed by your health care provider. Most women can continue their usual exercise routine during pregnancy. Try to exercise for 30 minutes at least 5 days a week. Stop exercising if you experience uterine contractions.  Avoid heavy lifting.  Do   not exercise in extreme heat or humidity, or at high altitudes.  Wear low-heel, comfortable shoes.  Practice good posture.  You may continue to have sex unless your health care provider tells you otherwise. Relieving pain and discomfort  Take frequent breaks and rest with your legs elevated if you have leg cramps or low back pain.  Take warm sitz baths to soothe any pain or discomfort caused by hemorrhoids. Use hemorrhoid cream if your health care provider approves.  Wear a good support bra to prevent discomfort from breast tenderness.  If you develop varicose veins: ? Wear support pantyhose or compression stockings as told by your healthcare provider. ? Elevate your feet for 15 minutes, 3-4 times a day. Prenatal care  Write down your questions. Take them to your prenatal visits.  Keep all your prenatal visits as told by your health care provider. This is important. Safety  Wear your seat belt at all times when driving.  Make  a list of emergency phone numbers, including numbers for family, friends, the hospital, and police and fire departments. General instructions  Avoid cat litter boxes and soil used by cats. These carry germs that can cause birth defects in the baby. If you have a cat, ask someone to clean the litter box for you.  Do not travel far distances unless it is absolutely necessary and only with the approval of your health care provider.  Do not use hot tubs, steam rooms, or saunas.  Do not drink alcohol.  Do not use any products that contain nicotine or tobacco, such as cigarettes and e-cigarettes. If you need help quitting, ask your health care provider.  Do not use any medicinal herbs or unprescribed drugs. These chemicals affect the formation and growth of the baby.  Do not douche or use tampons or scented sanitary pads.  Do not cross your legs for long periods of time.  To prepare for the arrival of your baby: ? Take prenatal classes to understand, practice, and ask questions about labor and delivery. ? Make a trial run to the hospital. ? Visit the hospital and tour the maternity area. ? Arrange for maternity or paternity leave through employers. ? Arrange for family and friends to take care of pets while you are in the hospital. ? Purchase a rear-facing car seat and make sure you know how to install it in your car. ? Pack your hospital bag. ? Prepare the baby's nursery. Make sure to remove all pillows and stuffed animals from the baby's crib to prevent suffocation.  Visit your dentist if you have not gone during your pregnancy. Use a soft toothbrush to brush your teeth and be gentle when you floss. Contact a health care provider if:  You are unsure if you are in labor or if your water has broken.  You become dizzy.  You have mild pelvic cramps, pelvic pressure, or nagging pain in your abdominal area.  You have lower back pain.  You have persistent nausea, vomiting, or  diarrhea.  You have an unusual or bad smelling vaginal discharge.  You have pain when you urinate. Get help right away if:  Your water breaks before 37 weeks.  You have regular contractions less than 5 minutes apart before 37 weeks.  You have a fever.  You are leaking fluid from your vagina.  You have spotting or bleeding from your vagina.  You have severe abdominal pain or cramping.  You have rapid weight loss or weight gain.  You have   shortness of breath with chest pain.  You notice sudden or extreme swelling of your face, hands, ankles, feet, or legs.  Your baby makes fewer than 10 movements in 2 hours.  You have severe headaches that do not go away when you take medicine.  You have vision changes. Summary  The third trimester is from week 28 through week 40, months 7 through 9. The third trimester is a time when the unborn baby (fetus) is growing rapidly.  During the third trimester, your discomfort may increase as you and your baby continue to gain weight. You may have abdominal, leg, and back pain, sleeping problems, and an increased need to urinate.  During the third trimester your breasts will keep growing and they will continue to become tender. A yellow fluid (colostrum) may leak from your breasts. This is the first milk you are producing for your baby.  False labor is a condition in which you feel small, irregular tightenings of the muscles in the womb (contractions) that eventually go away. These are called Braxton Hicks contractions. Contractions may last for hours, days, or even weeks before true labor sets in.  Signs of labor can include: abdominal cramps; regular contractions that start at 10 minutes apart and become stronger and more frequent with time; watery or bloody mucus discharge that comes from the vagina; increased pelvic pressure and dull back pain; and leaking of amniotic fluid. This information is not intended to replace advice given to you by your  health care provider. Make sure you discuss any questions you have with your health care provider. Document Released: 06/10/2001 Document Revised: 07/22/2016 Document Reviewed: 07/22/2016 Elsevier Interactive Patient Education  2019 [employer]  

## 2018-06-25 NOTE — Progress Notes (Signed)
ROB: Notes complaints of round ligament pain.  Discussed treatment measures. Also encouraged on pregnancy girdle. Continue to monitor BMI, currently 43. Will need Anesthesia consult after BMI of 45.  Desires to breastfeed. Desires unsure method for contraception. For Tdap today, signed blood consent.  Will return early next week to perform 1 hr glucola. RTC in 2 weeks.

## 2018-06-25 NOTE — Progress Notes (Signed)
ROB-Pt stated that she had spotting once and it went away. Pt also noticed that her breast was leaking milk, but stopped. pt stated that when she walks she has pain in her right thigh area. No other complaints.

## 2018-06-28 ENCOUNTER — Other Ambulatory Visit: Payer: BLUE CROSS/BLUE SHIELD

## 2018-06-30 NOTE — L&D Delivery Note (Signed)
Delivery Summary for Ashley Strickland  Labor Events:   Preterm labor:   Rupture date: 09/09/2018  Rupture time: 7:56 AM  Rupture type: Artificial Intact  Fluid Color:   Induction:   Augmentation:   Complications:   Cervical ripening:          Delivery:   Episiotomy:   Lacerations:   Repair suture:   Repair # of packets:   Blood loss (ml): 50   Information for the patient's newborn:  Ashley Strickland, Ashley Strickland [[id number]]    Delivery 09/09/2018 12:30 PM by  Vaginal, Spontaneous Sex:  female Gestational Age: [redacted]w[redacted]d Delivery Clinician:   Living?:         APGARS  One minute Five minutes Ten minutes  Skin color:        Heart rate:        Grimace:        Muscle tone:        Breathing:        Totals: 8  9      Presentation/position:      Resuscitation:   Cord information:    Disposition of cord blood:     Blood gases sent?  Complications:   Placenta: Delivered:       appearance Newborn Measurements: Weight: 7 lb 2.6 oz (3250 g)  Height: 19.88"  Head circumference:    Chest circumference:    Other providers:    Additional  information: Forceps:   Vacuum:   Breech:   Observed anomalies       Delivery Note At 12:30 PM a viable and healthy female was delivered via Vaginal, Spontaneous (Presentation: Vertex; ROA position.  APGAR: 8, 9; weight 3250 grams.   Placenta status: spontaneously removed, intact.  Cord: 3-vessel with the following complications: nuchal cord x 1, reducible after delivery of head at perineum.  Cord pH: not obtained.  Anesthesia:  Epidural Episiotomy: None Lacerations:  1st degree perineal Suture Repair: 3.0 vicryl Est. Blood Loss (mL):  50   Mom to postpartum.  Baby to Couplet care / Skin to Skin.  Hildred Laser 09/09/2018, 12:58 PM

## 2018-07-08 ENCOUNTER — Encounter: Payer: Self-pay | Admitting: Obstetrics and Gynecology

## 2018-07-08 ENCOUNTER — Ambulatory Visit (INDEPENDENT_AMBULATORY_CARE_PROVIDER_SITE_OTHER): Payer: BLUE CROSS/BLUE SHIELD | Admitting: Obstetrics and Gynecology

## 2018-07-08 ENCOUNTER — Other Ambulatory Visit: Payer: BLUE CROSS/BLUE SHIELD

## 2018-07-08 VITALS — BP 119/82 | HR 118 | Ht 61.0 in | Wt 231.3 lb

## 2018-07-08 DIAGNOSIS — Z3402 Encounter for supervision of normal first pregnancy, second trimester: Secondary | ICD-10-CM | POA: Diagnosis not present

## 2018-07-08 LAB — POCT URINALYSIS DIPSTICK OB
Bilirubin, UA: NEGATIVE
Glucose, UA: NEGATIVE
Nitrite, UA: NEGATIVE
RBC UA: NEGATIVE
Spec Grav, UA: 1.01 (ref 1.010–1.025)
Urobilinogen, UA: 0.2 E.U./dL
pH, UA: 5 (ref 5.0–8.0)

## 2018-07-08 NOTE — Progress Notes (Signed)
Patient comes in today for her 31 week OB visit. Patient was not able to do the 1 hour GTT today due to getting sick.

## 2018-07-08 NOTE — Progress Notes (Signed)
ROB: Patient reports active movement has no pregnancy complaints.  She attempted to do her 1 hour GCT today using twins letters and has again failed.  She states that this has caused vomiting.  She has requested that she try 1 more time for her 1 hour GCT.  I have explained to her that we will begin using fingerstick glucose testing should she fail to completely 1 hour GCT.  She understands this.  1 hour GCT 1 week.

## 2018-07-10 ENCOUNTER — Other Ambulatory Visit: Payer: Self-pay | Admitting: Obstetrics and Gynecology

## 2018-07-10 DIAGNOSIS — O99213 Obesity complicating pregnancy, third trimester: Secondary | ICD-10-CM

## 2018-07-15 ENCOUNTER — Other Ambulatory Visit: Payer: BLUE CROSS/BLUE SHIELD

## 2018-07-15 DIAGNOSIS — Z3403 Encounter for supervision of normal first pregnancy, third trimester: Secondary | ICD-10-CM

## 2018-07-16 LAB — GLUCOSE, 1 HOUR GESTATIONAL: Gestational Diabetes Screen: 89 mg/dL (ref 65–139)

## 2018-07-16 LAB — CBC
HEMATOCRIT: 33.2 % — AB (ref 34.0–46.6)
Hemoglobin: 11.5 g/dL (ref 11.1–15.9)
MCH: 27.3 pg (ref 26.6–33.0)
MCHC: 34.6 g/dL (ref 31.5–35.7)
MCV: 79 fL (ref 79–97)
Platelets: 219 10*3/uL (ref 150–450)
RBC: 4.22 x10E6/uL (ref 3.77–5.28)
RDW: 12.8 % (ref 11.7–15.4)
WBC: 14.4 10*3/uL — ABNORMAL HIGH (ref 3.4–10.8)

## 2018-07-16 LAB — RPR: RPR Ser Ql: NONREACTIVE

## 2018-07-21 ENCOUNTER — Ambulatory Visit (INDEPENDENT_AMBULATORY_CARE_PROVIDER_SITE_OTHER): Payer: BLUE CROSS/BLUE SHIELD

## 2018-07-21 DIAGNOSIS — Z362 Encounter for other antenatal screening follow-up: Secondary | ICD-10-CM

## 2018-07-21 DIAGNOSIS — O99213 Obesity complicating pregnancy, third trimester: Secondary | ICD-10-CM

## 2018-07-23 ENCOUNTER — Telehealth: Payer: Self-pay | Admitting: Obstetrics and Gynecology

## 2018-07-23 ENCOUNTER — Ambulatory Visit (INDEPENDENT_AMBULATORY_CARE_PROVIDER_SITE_OTHER): Payer: BLUE CROSS/BLUE SHIELD | Admitting: Obstetrics and Gynecology

## 2018-07-23 VITALS — BP 126/89 | HR 114 | Wt 232.6 lb

## 2018-07-23 DIAGNOSIS — O99210 Obesity complicating pregnancy, unspecified trimester: Secondary | ICD-10-CM

## 2018-07-23 DIAGNOSIS — O0993 Supervision of high risk pregnancy, unspecified, third trimester: Secondary | ICD-10-CM

## 2018-07-23 DIAGNOSIS — O99213 Obesity complicating pregnancy, third trimester: Secondary | ICD-10-CM

## 2018-07-23 LAB — POCT URINALYSIS DIPSTICK OB
Glucose, UA: NEGATIVE
Ketones, UA: NEGATIVE
Nitrite, UA: NEGATIVE
Spec Grav, UA: >=1.03 — AB (ref 1.010–1.025)
Urobilinogen, UA: 0.2 E.U./dL
pH, UA: 6 (ref 5.0–8.0)

## 2018-07-23 MED ORDER — PANTOPRAZOLE SODIUM 20 MG PO TBEC: 20.0000 mg | DELAYED_RELEASE_TABLET | Freq: Two times a day (BID) | ORAL | 2 refills | Status: DC

## 2018-07-23 NOTE — Progress Notes (Signed)
ROB: Patient noting reflux symptoms, throughout the day not relieved by Tums. Prescribed Protonix. Discussed need for antenatal testing weekly beginning at 36 weeks for morbid obesity in pregnancy, as well as delivery by 40 weeks. For repeat growth scan at 36 weeks. Recent growth scan noted growth at 23%ile. RTC in 2 weeks.

## 2018-07-23 NOTE — Progress Notes (Signed)
ROB-Pt present for prenatal visit. Pt stated that she has been feeling sick on the stomach for several days and do not feel well.

## 2018-07-23 NOTE — Telephone Encounter (Signed)
Pt has not been making OB payments, Pt stated she has been applying for pregnancy medicaid. Pt was informed she has to make payments on her past due balance. Pt paid $10.00 on 07/23/2018, Pt was informed that she MUST pay past due and following month balance $137.10 by next appointment or pt will have to reschedule apt/not be seen within the office. Thank you.

## 2018-07-25 ENCOUNTER — Encounter: Payer: Self-pay | Admitting: *Deleted

## 2018-07-25 ENCOUNTER — Observation Stay
Admission: EM | Admit: 2018-07-25 | Discharge: 2018-07-25 | Disposition: A | Discharge status: Home / Self Care | Payer: BLUE CROSS/BLUE SHIELD | Attending: Obstetrics and Gynecology | Admitting: Obstetrics and Gynecology

## 2018-07-25 DIAGNOSIS — Z3A33 33 weeks gestation of pregnancy: Secondary | ICD-10-CM | POA: Diagnosis not present

## 2018-07-25 DIAGNOSIS — O26893 Other specified pregnancy related conditions, third trimester: Secondary | ICD-10-CM | POA: Insufficient documentation

## 2018-07-25 DIAGNOSIS — Z3490 Encounter for supervision of normal pregnancy, unspecified, unspecified trimester: Secondary | ICD-10-CM

## 2018-07-25 DIAGNOSIS — R109 Unspecified abdominal pain: Secondary | ICD-10-CM | POA: Diagnosis not present

## 2018-07-25 DIAGNOSIS — O212 Late vomiting of pregnancy: Principal | ICD-10-CM | POA: Insufficient documentation

## 2018-07-25 MED ORDER — ACETAMINOPHEN 500 MG PO TABS: 1000.0000 mg | ORAL_TABLET | Freq: Once | ORAL | Status: DC

## 2018-07-25 NOTE — OB Triage Note (Signed)
Recvd pt from ED. Pt c/o vomiting around 11 and states her stomach hurts. Pt has not thrown up since being in the hospital and took a zofran about an hour ago. No vaginal bleeding or LOF. Feeling baby move well.

## 2018-07-25 NOTE — Discharge Instructions (Signed)
LABOR: When contractions begin, you should start to time them from the beginning of one contraction to the beginning of the next.  When contractions are 5-10 minutes apart or less and have been regular for at least an hour, you should call your health care provider.  Notify your doctor if any of the following occur: 1. Bleeding from the vagina 7. Sudden, constant, or occasional abdominal pain  2. Pain or burning when urinating 8. Sudden gushing of fluid from the vagina (with or without continued leaking)  3. Chills or fever 9. Fainting spells, "black outs" or loss of consciousness  4. Increase in vaginal discharge 10. Severe or continued nausea or vomiting  5. Pelvic pressure (sudden increase) 11. Blurring of vision or spots before the eyes  6. Baby moving less than usual 12. Leaking of fluid    FETAL KICK COUNT: Lie on your left side for one hour after a meal, and count the number of times your baby kicks. If it is less than 5 times, get up, move around and drink some juice. Repeat the test 30 minutes later. If it is still less than 5 kicks in an hour, notify your doctor.  Get plenty of rest and stay well hydrated! 

## 2018-07-25 NOTE — Discharge Summary (Signed)
L&D OB Triage Note  Femi Dalal is a 23 y.o. G1P0 female at [redacted]w[redacted]d, EDD Estimated Date of Delivery: 09/09/18 who presented to triage for complaints of nausea/vomiting and stomach pain x 1 episode.  Took Zofran ~ 1 hour ago prior to presentation in triage. She was evaluated by the nurses with no significant findings.  She had no further episodes of vomiting and her pain resolved.  Vital signs stable. An NST was performed and has been reviewed by MD. She was treated with.   NST INTERPRETATION: Indications: patient reassurance  Mode: External Baseline Rate (A): 130 bpm Variability: Moderate Accelerations: 15 x 15 Decelerations: None     Contraction Frequency (min): occasional UI  Impression: reactive   Plan: NST performed was reviewed and was found to be reactive. She was discharged home and encouraged to stay hydrated, eat bland foods for next meal.  Continue routine prenatal care. Follow up with OB/GYN as previously scheduled.     Hildred Laser, MD  [facility]

## 2018-08-06 ENCOUNTER — Encounter: Payer: Self-pay | Admitting: Obstetrics and Gynecology

## 2018-08-06 ENCOUNTER — Ambulatory Visit (INDEPENDENT_AMBULATORY_CARE_PROVIDER_SITE_OTHER): Payer: BLUE CROSS/BLUE SHIELD | Admitting: Obstetrics and Gynecology

## 2018-08-06 VITALS — BP 126/88 | HR 98 | Ht 61.0 in | Wt 233.9 lb

## 2018-08-06 DIAGNOSIS — O99213 Obesity complicating pregnancy, third trimester: Secondary | ICD-10-CM

## 2018-08-06 DIAGNOSIS — O0993 Supervision of high risk pregnancy, unspecified, third trimester: Secondary | ICD-10-CM

## 2018-08-06 LAB — POCT URINALYSIS DIPSTICK OB
Bilirubin, UA: NEGATIVE
Glucose, UA: NEGATIVE
Ketones, UA: NEGATIVE
Nitrite, UA: NEGATIVE
PH UA: 7 (ref 5.0–8.0)
SPEC GRAV UA: 1.01 (ref 1.010–1.025)
UROBILINOGEN UA: 0.2 U/dL

## 2018-08-06 NOTE — Progress Notes (Signed)
Patient comes in today for her ROB appointment. She is having a lot of hip pain.

## 2018-08-06 NOTE — Progress Notes (Signed)
ROB: Patient complaining of hip pain.  Denies contractions.  Cultures next week.  Biophysical profile with growth scan next week and then weekly NSTs after that.  I have discussed this with her.

## 2018-08-11 ENCOUNTER — Ambulatory Visit (INDEPENDENT_AMBULATORY_CARE_PROVIDER_SITE_OTHER): Payer: BLUE CROSS/BLUE SHIELD

## 2018-08-11 DIAGNOSIS — O99213 Obesity complicating pregnancy, third trimester: Secondary | ICD-10-CM

## 2018-08-13 ENCOUNTER — Ambulatory Visit (INDEPENDENT_AMBULATORY_CARE_PROVIDER_SITE_OTHER): Payer: BLUE CROSS/BLUE SHIELD | Admitting: Obstetrics and Gynecology

## 2018-08-13 VITALS — BP 109/78 | HR 93 | Wt 234.8 lb

## 2018-08-13 DIAGNOSIS — Z3403 Encounter for supervision of normal first pregnancy, third trimester: Secondary | ICD-10-CM

## 2018-08-13 LAB — POCT URINALYSIS DIPSTICK OB
BILIRUBIN UA: NEGATIVE
Glucose, UA: NEGATIVE
Ketones, UA: NEGATIVE
Nitrite, UA: NEGATIVE
Spec Grav, UA: >=1.03 — AB (ref 1.010–1.025)
Urobilinogen, UA: 0.2 E.U./dL
pH, UA: 6.5 (ref 5.0–8.0)

## 2018-08-13 NOTE — Progress Notes (Signed)
ROB-Pt stated that she was doing well other than being tired a lot. Pt no problems or concerns.

## 2018-08-13 NOTE — Progress Notes (Signed)
ROB: Patient without complaint.  Denies contractions.  GC/CT-GBS performed today.

## 2018-08-15 LAB — STREP GP B NAA: Strep Gp B NAA: NEGATIVE

## 2018-08-18 ENCOUNTER — Other Ambulatory Visit: Payer: Self-pay | Admitting: Obstetrics and Gynecology

## 2018-08-18 LAB — GC/CHLAMYDIA PROBE AMP
Chlamydia trachomatis, NAA: NEGATIVE
Neisseria gonorrhoeae by PCR: NEGATIVE

## 2018-08-20 ENCOUNTER — Other Ambulatory Visit: Payer: BLUE CROSS/BLUE SHIELD

## 2018-08-20 ENCOUNTER — Ambulatory Visit (INDEPENDENT_AMBULATORY_CARE_PROVIDER_SITE_OTHER): Payer: BLUE CROSS/BLUE SHIELD | Admitting: Obstetrics and Gynecology

## 2018-08-20 VITALS — BP 114/79 | HR 93 | Wt 237.2 lb

## 2018-08-20 DIAGNOSIS — O99213 Obesity complicating pregnancy, third trimester: Secondary | ICD-10-CM

## 2018-08-20 DIAGNOSIS — O26893 Other specified pregnancy related conditions, third trimester: Secondary | ICD-10-CM

## 2018-08-20 DIAGNOSIS — M25559 Pain in unspecified hip: Secondary | ICD-10-CM

## 2018-08-20 DIAGNOSIS — O99210 Obesity complicating pregnancy, unspecified trimester: Secondary | ICD-10-CM

## 2018-08-20 DIAGNOSIS — Z3403 Encounter for supervision of normal first pregnancy, third trimester: Secondary | ICD-10-CM

## 2018-08-20 LAB — POCT URINALYSIS DIPSTICK OB
BILIRUBIN UA: NEGATIVE
Glucose, UA: NEGATIVE
Ketones, UA: NEGATIVE
Nitrite, UA: NEGATIVE
PH UA: 6.5 (ref 5.0–8.0)
Spec Grav, UA: >=1.03 — AB (ref 1.010–1.025)
UROBILINOGEN UA: 0.2 U/dL

## 2018-08-20 NOTE — Patient Instructions (Signed)
Pain Relief During Labor and Delivery  Many things can cause pain during labor and delivery, including:  · Pressure on bones and ligaments due to the baby moving through the pelvis.  · Stretching of tissues due to the baby moving through the birth canal.  · Muscle tension due to anxiety or nervousness.  · The uterus tightening (contracting) and relaxing to help move the baby.  There are many ways to deal with the pain of labor and delivery. They include:  · Taking prenatal classes. Taking these classes helps you know what to expect during your baby’s birth. What you learn will increase your confidence and decrease your anxiety.  · Practicing relaxation techniques or doing relaxing activities, such as:  ? Focused breathing.  ? Meditation.  ? Visualization.  ? Aroma therapy.  ? Listening to your favorite music.  ? Hypnosis.  · Taking a warm shower or bath (hydrotherapy). This may:  ? Provide comfort and relaxation.  ? Lessen your perception of pain.  ? Decrease the amount of pain medicine needed.  ? Decrease the length of labor.  · Getting a massage or counterpressure on your back.  · Applying warm packs or ice packs.  · Changing positions often, moving around, or using a birthing ball.  · Getting:  ? Pain medicine through an IV or injection into a muscle.  ? Pain medicine inserted into your spinal column.  ? Injections of sterile water just under the skin on your lower back (intradermal injections).  ? Laughing gas (nitrous oxide).  Discuss your pain control options with your health care provider during your prenatal visits. Explore the options offered by your hospital or birth center.  What kinds of medicine are available?  There are two kinds of medicines that can be used to relieve pain during labor and delivery:  · Analgesics. These medicines decrease pain without causing you to lose feeling or the ability to move your muscles.  · Anesthetics. These medicines block feeling in the body and can decrease your  ability to move freely.  Both of these kinds of medicine can cause minor side effects, such as nausea, trouble concentrating, and sleepiness. They can also decrease the baby's heart rate before birth and affect the baby’s breathing rate after birth. For this reason, health care providers are careful about when and how much medicine is given.  What are specific medicines and procedures that provide pain relief?  Local Anesthetics  Local anesthetics are used to numb a small area of the body. They may be used along with another kind of anesthetic or used to numb the nerves of the vagina, cervix, and perineum during the second stage of labor.  General Anesthetics  General anesthetics cause you to lose consciousness so you do not feel pain. They are usually only used for an emergency cesarean delivery. General anesthetics are given through an IV tube and a mask.  Pudendal Block  A pudendal block is a form of local anesthetic. It may be used to relieve the pain associated with pushing or stretching of the perineum at the time of delivery or to further numb the perineum. A pudendal block is done by injecting numbing medicine through the vaginal wall into a nerve in the pelvis.  Epidural Analgesia  Epidural analgesia is given through a flexible IV catheter that is inserted into the lower back. Numbing medicine is delivered continuously to the area near your spinal column nerves (epidural space). After having this type of analgesia, you   may be able to move your legs but you most likely will not be able to walk. Depending on the amount of medicine given, you may lose all feeling in the lower half of your body, or you may retain some level of sensation, including the urge to push. Epidural analgesia can be used to provide pain relief for a vaginal birth.  Spinal Block  A spinal block is similar to epidural analgesia, but the medicine is injected into the spinal fluid instead of the epidural space. A spinal block is only given  once. It starts to relieve pain quickly, but the pain relief lasts only 1-6 hours. Spinal blocks can be used for cesarean deliveries.  Combined Spinal-Epidural (CSE) Block  A CSE block combines the effects of a spinal block and epidural analgesia. The spinal block works quickly to block all pain. The epidural analgesia provides continuous pain relief, even after the effects of the spinal block have worn off.  This information is not intended to replace advice given to you by your health care provider. Make sure you discuss any questions you have with your health care provider.  Document Released: 10/02/2008 Document Revised: 11/23/2015 Document Reviewed: 11/07/2015  Elsevier Interactive Patient Education © 2019 [employer]

## 2018-08-20 NOTE — Progress Notes (Signed)
ROB: Notes complaints of hip pain. Advised on use of pregnancy girdle during the day, continue use of birthing ball and exercising/stretching of hips.  Discussed pain management in labor, given list of birthing classes. NST performed today was reviewed and was found to be reactive.  Continue recommended antenatal testing and prenatal care. Consultation to Anesthesia if BMI above 45 (currently 44).   NONSTRESS TEST INTERPRETATION  INDICATIONS: Obesity  FHR baseline: 135 RESULTS:Reactive COMMENTS: Uterine irritability present   PLAN: 1. Continue fetal kick counts twice a day. 2. Continue antepartum testing as scheduled-weekly

## 2018-08-20 NOTE — Progress Notes (Signed)
ROB- pt in office for prenatal care and NST. Pt stated that she was doing well no problems.

## 2018-08-27 ENCOUNTER — Ambulatory Visit (INDEPENDENT_AMBULATORY_CARE_PROVIDER_SITE_OTHER): Payer: BLUE CROSS/BLUE SHIELD | Admitting: Obstetrics and Gynecology

## 2018-08-27 ENCOUNTER — Encounter: Payer: Self-pay | Admitting: Obstetrics and Gynecology

## 2018-08-27 ENCOUNTER — Other Ambulatory Visit: Payer: BLUE CROSS/BLUE SHIELD

## 2018-08-27 VITALS — BP 116/89 | HR 106 | Wt 239.8 lb

## 2018-08-27 DIAGNOSIS — O26893 Other specified pregnancy related conditions, third trimester: Secondary | ICD-10-CM | POA: Diagnosis not present

## 2018-08-27 DIAGNOSIS — R109 Unspecified abdominal pain: Secondary | ICD-10-CM | POA: Diagnosis not present

## 2018-08-27 DIAGNOSIS — O99213 Obesity complicating pregnancy, third trimester: Secondary | ICD-10-CM

## 2018-08-27 DIAGNOSIS — O99210 Obesity complicating pregnancy, unspecified trimester: Secondary | ICD-10-CM

## 2018-08-27 DIAGNOSIS — Z3403 Encounter for supervision of normal first pregnancy, third trimester: Secondary | ICD-10-CM

## 2018-08-27 LAB — POCT URINALYSIS DIPSTICK OB
Bilirubin, UA: NEGATIVE
Blood, UA: NEGATIVE
Glucose, UA: NEGATIVE
Ketones, UA: NEGATIVE
NITRITE UA: NEGATIVE
POC,PROTEIN,UA: NEGATIVE
Spec Grav, UA: 1.025 (ref 1.010–1.025)
Urobilinogen, UA: 0.2 E.U./dL
pH, UA: 6.5 (ref 5.0–8.0)

## 2018-08-27 NOTE — Progress Notes (Signed)
ROB: Denies contractions.  Has no complaints.  Reports daily fetal movement.  Discussed possibility of induction if she does not deliver by her due date.  This is not scheduled yet.  NST reactive today.  Repeat weekly.  Signs and symptoms of labor discussed.

## 2018-08-27 NOTE — Progress Notes (Signed)
ROB-Pt present today for prenatal and NST. Pt stated that she is having pressure in the abd area.

## 2018-09-03 ENCOUNTER — Ambulatory Visit (INDEPENDENT_AMBULATORY_CARE_PROVIDER_SITE_OTHER): Payer: BLUE CROSS/BLUE SHIELD | Admitting: Obstetrics and Gynecology

## 2018-09-03 ENCOUNTER — Encounter: Payer: Self-pay | Admitting: Obstetrics and Gynecology

## 2018-09-03 VITALS — BP 109/75 | HR 90 | Wt 240.7 lb

## 2018-09-03 DIAGNOSIS — Z3403 Encounter for supervision of normal first pregnancy, third trimester: Secondary | ICD-10-CM

## 2018-09-03 DIAGNOSIS — R102 Pelvic and perineal pain: Secondary | ICD-10-CM | POA: Diagnosis not present

## 2018-09-03 DIAGNOSIS — O26899 Other specified pregnancy related conditions, unspecified trimester: Secondary | ICD-10-CM

## 2018-09-03 DIAGNOSIS — O99213 Obesity complicating pregnancy, third trimester: Secondary | ICD-10-CM

## 2018-09-03 DIAGNOSIS — O26893 Other specified pregnancy related conditions, third trimester: Secondary | ICD-10-CM | POA: Diagnosis not present

## 2018-09-03 LAB — POCT URINALYSIS DIPSTICK OB
Bilirubin, UA: NEGATIVE
Blood, UA: NEGATIVE
Glucose, UA: NEGATIVE
Ketones, UA: NEGATIVE
Nitrite, UA: NEGATIVE
Spec Grav, UA: 1.02 (ref 1.010–1.025)
Urobilinogen, UA: 0.2 E.U./dL
pH, UA: 7 (ref 5.0–8.0)

## 2018-09-03 NOTE — Progress Notes (Signed)
ROB: Patient noting a lot of back pain and pelvic pressure.  Discussion had regarding scheduling IOL for increased BMI (currently 45).  Will schedule for IOL on 4/12 at midnight.  Discussed induction procedure.  Given natural remedies to stimulate labor. RTC in 1 week, if no change, can insert Foley Bulb.  NST performed today was reviewed and was found to be reactive.  Continue recommended antenatal testing and prenatal care.    NONSTRESS TEST INTERPRETATION  INDICATIONS: Obesity (BMI 45)  FHR baseline: 135 bpm RESULTS:Reactive COMMENTS: occasional contractions with uterine irritability   PLAN: 1. Continue fetal kick counts twice a day. 2. Continue antepartum testing as scheduled-iweekly

## 2018-09-03 NOTE — Progress Notes (Signed)
ROB-pt is present today for prenatal care. Pt stated that is having a lot of pelvic/vaginal/back pressure. Pt stated that she doing well other than that.

## 2018-09-08 ENCOUNTER — Telehealth: Payer: Self-pay | Admitting: Obstetrics and Gynecology

## 2018-09-08 ENCOUNTER — Other Ambulatory Visit: Payer: BLUE CROSS/BLUE SHIELD

## 2018-09-08 ENCOUNTER — Inpatient Hospital Stay
Admission: EM | Admit: 2018-09-08 | Discharge: 2018-09-11 | DRG: 805 | Disposition: A | Discharge status: Home / Self Care | Payer: BLUE CROSS/BLUE SHIELD | Attending: Obstetrics and Gynecology | Admitting: Obstetrics and Gynecology

## 2018-09-08 ENCOUNTER — Other Ambulatory Visit: Payer: Self-pay

## 2018-09-08 ENCOUNTER — Ambulatory Visit (INDEPENDENT_AMBULATORY_CARE_PROVIDER_SITE_OTHER): Payer: BLUE CROSS/BLUE SHIELD | Admitting: Obstetrics and Gynecology

## 2018-09-08 ENCOUNTER — Encounter: Payer: Self-pay | Admitting: Obstetrics and Gynecology

## 2018-09-08 VITALS — BP 111/79 | HR 105 | Wt 242.8 lb

## 2018-09-08 DIAGNOSIS — O99213 Obesity complicating pregnancy, third trimester: Secondary | ICD-10-CM | POA: Diagnosis not present

## 2018-09-08 DIAGNOSIS — I951 Orthostatic hypotension: Secondary | ICD-10-CM | POA: Diagnosis not present

## 2018-09-08 DIAGNOSIS — O9902 Anemia complicating childbirth: Secondary | ICD-10-CM | POA: Diagnosis present

## 2018-09-08 DIAGNOSIS — O480 Post-term pregnancy: Secondary | ICD-10-CM | POA: Diagnosis not present

## 2018-09-08 DIAGNOSIS — D649 Anemia, unspecified: Secondary | ICD-10-CM | POA: Diagnosis present

## 2018-09-08 DIAGNOSIS — R000 Tachycardia, unspecified: Secondary | ICD-10-CM | POA: Diagnosis not present

## 2018-09-08 DIAGNOSIS — Z3A39 39 weeks gestation of pregnancy: Secondary | ICD-10-CM

## 2018-09-08 DIAGNOSIS — I498 Other specified cardiac arrhythmias: Secondary | ICD-10-CM | POA: Diagnosis present

## 2018-09-08 DIAGNOSIS — O99214 Obesity complicating childbirth: Secondary | ICD-10-CM | POA: Diagnosis present

## 2018-09-08 DIAGNOSIS — Z6841 Body Mass Index (BMI) 40.0 and over, adult: Secondary | ICD-10-CM

## 2018-09-08 DIAGNOSIS — A1801 Tuberculosis of spine: Secondary | ICD-10-CM

## 2018-09-08 DIAGNOSIS — O9942 Diseases of the circulatory system complicating childbirth: Secondary | ICD-10-CM | POA: Diagnosis present

## 2018-09-08 DIAGNOSIS — Z3403 Encounter for supervision of normal first pregnancy, third trimester: Secondary | ICD-10-CM

## 2018-09-08 DIAGNOSIS — Z3A40 40 weeks gestation of pregnancy: Secondary | ICD-10-CM | POA: Diagnosis not present

## 2018-09-08 DIAGNOSIS — O26893 Other specified pregnancy related conditions, third trimester: Secondary | ICD-10-CM | POA: Diagnosis not present

## 2018-09-08 LAB — TYPE AND SCREEN
ABO/RH(D): AB POS
Antibody Screen: NEGATIVE

## 2018-09-08 LAB — CBC
HCT: 34.6 % — ABNORMAL LOW (ref 36.0–46.0)
HEMOGLOBIN: 11 g/dL — AB (ref 12.0–15.0)
MCH: 24.8 pg — ABNORMAL LOW (ref 26.0–34.0)
MCHC: 31.8 g/dL (ref 30.0–36.0)
MCV: 78.1 fL — ABNORMAL LOW (ref 80.0–100.0)
Platelets: 251 10*3/uL (ref 150–400)
RBC: 4.43 MIL/uL (ref 3.87–5.11)
RDW: 14.3 % (ref 11.5–15.5)
WBC: 16 10*3/uL — ABNORMAL HIGH (ref 4.0–10.5)
nRBC: 0 % (ref 0.0–0.2)

## 2018-09-08 LAB — POCT URINALYSIS DIPSTICK OB
Bilirubin, UA: NEGATIVE
Blood, UA: NEGATIVE
Glucose, UA: NEGATIVE
Ketones, UA: NEGATIVE
NITRITE UA: NEGATIVE
Spec Grav, UA: >=1.03 — AB (ref 1.010–1.025)
Urobilinogen, UA: 0.2 E.U./dL
pH, UA: 6 (ref 5.0–8.0)

## 2018-09-08 MED ORDER — OXYCODONE-ACETAMINOPHEN 5-325 MG PO TABS: 1.0000 | ORAL_TABLET | ORAL | Status: DC | PRN

## 2018-09-08 MED ORDER — LIDOCAINE HCL (PF) 1 % IJ SOLN: 30.0000 mL | INTRAMUSCULAR | Status: DC | PRN

## 2018-09-08 MED ORDER — MISOPROSTOL 25 MCG QUARTER TABLET
25.0000 ug | ORAL_TABLET | Freq: Once | ORAL | Status: AC
  Administered 2018-09-08: 25 ug via VAGINAL
  Filled 2018-09-08: qty 1

## 2018-09-08 MED ORDER — BUTORPHANOL TARTRATE 1 MG/ML IJ SOLN
1.0000 mg | INTRAMUSCULAR | Status: DC | PRN
  Administered 2018-09-09: 1 mg via INTRAVENOUS
  Filled 2018-09-08: qty 1

## 2018-09-08 MED ORDER — OXYTOCIN 40 UNITS IN NORMAL SALINE INFUSION - SIMPLE MED
2.5000 [IU]/h | INTRAVENOUS | Status: DC
  Administered 2018-09-09 (×2): 2.5 [IU]/h via INTRAVENOUS

## 2018-09-08 MED ORDER — LACTATED RINGERS IV SOLN
500.0000 mL | INTRAVENOUS | Status: DC | PRN
  Administered 2018-09-09: 500 mL via INTRAVENOUS
  Administered 2018-09-09 (×2): 1000 mL via INTRAVENOUS

## 2018-09-08 MED ORDER — OXYCODONE-ACETAMINOPHEN 5-325 MG PO TABS: 2.0000 | ORAL_TABLET | ORAL | Status: DC | PRN

## 2018-09-08 MED ORDER — SOD CITRATE-CITRIC ACID 500-334 MG/5ML PO SOLN: 30.0000 mL | ORAL | Status: DC | PRN

## 2018-09-08 MED ORDER — OXYTOCIN 40 UNITS IN NORMAL SALINE INFUSION - SIMPLE MED
1.0000 m[IU]/min | INTRAVENOUS | Status: DC
  Administered 2018-09-09: 4 m[IU]/min via INTRAVENOUS
  Filled 2018-09-08 (×2): qty 1000

## 2018-09-08 MED ORDER — TERBUTALINE SULFATE 1 MG/ML IJ SOLN: 0.2500 mg | Freq: Once | INTRAMUSCULAR | Status: DC | PRN

## 2018-09-08 MED ORDER — OXYTOCIN BOLUS FROM INFUSION
500.0000 mL | Freq: Once | INTRAVENOUS | Status: AC
  Administered 2018-09-09: 500 mL via INTRAVENOUS

## 2018-09-08 MED ORDER — LACTATED RINGERS IV SOLN
INTRAVENOUS | Status: DC
  Administered 2018-09-08 – 2018-09-09 (×2): via INTRAVENOUS

## 2018-09-08 MED ORDER — ACETAMINOPHEN 325 MG PO TABS: 650.0000 mg | ORAL_TABLET | ORAL | Status: DC | PRN

## 2018-09-08 MED ORDER — ONDANSETRON HCL 4 MG/2ML IJ SOLN
4.0000 mg | Freq: Four times a day (QID) | INTRAMUSCULAR | Status: DC | PRN
  Administered 2018-09-09 (×2): 4 mg via INTRAVENOUS
  Filled 2018-09-08 (×2): qty 2

## 2018-09-08 NOTE — Telephone Encounter (Signed)
Pt called and informed that she came go to the ED in about an hour. Pt stated that she understood.

## 2018-09-08 NOTE — Progress Notes (Signed)
ROB-Pt present today for NST and prenatal visit. Pt stated that she is still feeling nauseated throughout the day. No other problems or concerns.

## 2018-09-08 NOTE — Telephone Encounter (Signed)
The patient's husband called and requested to speak with Geraldo Pitter if possible sometime today. No other information was disclosed, Please advise.

## 2018-09-08 NOTE — H&P (Signed)
Obstetric History and Physical  Ashley Strickland is a 23 y.o. G1P0 with IUP at [redacted]w[redacted]d presenting for scheduled IOL for morbid obesity in pregnancy (Body mass index is 45.91 kg/m.). She had a foley bulb placed in the office earlier today around noon, that was expelled at ~ 2:30 pm. Patient states she has been having  irregular, every 1-3 minutes contractions (mild), none vaginal bleeding, intact membranes, with active fetal movement.    Prenatal Course Source of Care: [facility] with onset of care at 9 weeks Pregnancy complications or risks: Patient Active Problem List   Diagnosis Date Noted  . Obesity complicating pregnancy in third trimester 09/08/2018  . Pregnancy 07/25/2018  . POTS (postural orthostatic tachycardia syndrome) 02/26/2018  . Morbid obesity (HCC) 02/26/2018  . 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 Nausea and vomiting in pregnancy 02/26/2018  . Maternal varicella, non-immune 02/21/2018  . Increased BMI 02/15/2018  . 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 Nausea and vomiting during pregnancy prior to [redacted] weeks gestation 02/15/2018  . Encounter for supervision of normal first pregnancy in first trimester 02/15/2018   She plans to breastfeed She desires unsure method for postpartum contraception.   Prenatal labs and studies: ABO, Rh: --/--/AB POS (03/11 2032) Antibody: PENDING (03/11 2032) Rubella: 1.02 (08/19 0956) RPR: Non Reactive (01/16 1006)  HBsAg: Negative (08/19 0956)  HIV: Non Reactive (08/19 0956)  ZOX:WRUEAVWU (02/14 1148) 1 hr Glucola  Normal Genetic screening normal Anatomy US normal   Past Medical History:  Diagnosis Date  . Anemia   . Anxiety   . Constipation   . Dermatitis   . Joint pain   . Orthostatic hypotension   . Personal history of other mental and behavioral disorders   . Pott's disease   . Vitamin D deficiency     Past Surgical History:  Procedure Laterality Date  . ADENOIDECTOMY      OB History  Gravida Para Term Preterm AB  Living  1            SAB TAB Ectopic Multiple Live Births               # Outcome Date GA Lbr Len/2nd Weight Sex Delivery Anes PTL Lv  1 Current             Social History   Socioeconomic History  . Marital status: Single    Spouse name: Not on file  . Number of children: Not on file  . Years of education: Not on file  . Highest education level: Not on file  Occupational History  . Not on file  Social Needs  . Financial resource strain: Not on file  . Food insecurity:    Worry: Not on file    Inability: Not on file  . Transportation needs:    Medical: Not on file    Non-medical: Not on file  Tobacco Use  . Smoking status: Never Smoker  . Smokeless tobacco: Never Used  Substance and Sexual Activity  . Alcohol use: No  . Drug use: No  . Sexual activity: Yes    Birth control/protection: None  Lifestyle  . Physical activity:    Days per week: Not on file    Minutes per session: Not on file  . Stress: Not on file  Relationships  . Social connections:    Talks on phone: Not on file    Gets together: Not on file    Attends religious service: Not on file    Active  member of club or organization: Not on file    Attends meetings of clubs or organizations: Not on file    Relationship status: Not on file  Other Topics Concern  . Not on file  Social History Narrative  . Not on file    Family History  Problem Relation Age of Onset  . Arthritis Mother   . Diabetes Neg Hx   . CAD Neg Hx   . Cancer - Other Neg Hx     Medications Prior to Admission  Medication Sig Dispense Refill Last Dose  . aspirin EC 81 MG tablet Take 1 tablet (81 mg total) by mouth daily. Take after 12 weeks for prevention of preeclampssia later in pregnancy 300 tablet 2 09/08/2018 at Unknown time  . ondansetron (ZOFRAN-ODT) 4 MG disintegrating tablet DISSOLVE 1 TABLET IN MOUTH EVERY 8 HOURS AS NEEDED FOR NAUSEA OR VOMITING 30 tablet 0 09/07/2018 at Unknown time  . pantoprazole (PROTONIX) 20 MG  tablet Take 1 tablet (20 mg total) by mouth 2 (two) times daily before a meal. 60 tablet 2 09/08/2018 at Unknown time  . Prenatal Vit-Fe Fumarate-FA (PRENATAL MULTIVITAMIN) TABS tablet Take 1 tablet by mouth daily at 12 noon.   09/07/2018 at Unknown time  . Doxylamine-Pyridoxine 10-10 MG TBEC Take 10 mg by mouth at bedtime as needed (2 tablets at bedtime). (Patient not taking: Reported on 09/08/2018) 60 tablet 1 Not Taking at Unknown time  . scopolamine (TRANSDERM-SCOP, 1.5 MG,) 1 MG/3DAYS Place 1 patch (1.5 mg total) onto the skin every 3 (three) days. 10 patch 11 Taking    Allergies  Allergen Reactions  . Phenergan [Promethazine Hcl] Other (See Comments)    Seizure     Review of Systems: Negative except for what is mentioned in HPI.  Physical Exam: BP 109/81 (BP Location: Left Arm)   Pulse (!) 105   Temp 98.3 F (36.8 C) (Oral)   Resp 18   Ht 5\' 1"  (1.549 m)   Wt 110.2 kg   LMP 12/09/2017 (Approximate)   BMI 45.91 kg/m  CONSTITUTIONAL: Well-developed, well-nourished female in no acute distress.  HENT:  Normocephalic, atraumatic, External right and left ear normal. Oropharynx is clear and moist EYES: Conjunctivae and EOM are normal. Pupils are equal, round, and reactive to light. No scleral icterus.  NECK: Normal range of motion, supple, no masses SKIN: Skin is warm and dry. No rash noted. Not diaphoretic. No erythema. No pallor. NEUROLOGIC: Alert and oriented to person, place, and time. Normal reflexes, muscle tone coordination. No cranial nerve deficit noted. PSYCHIATRIC: Normal mood and affect. Normal behavior. Normal judgment and thought content. CARDIOVASCULAR: Normal heart rate noted, regular rhythm RESPIRATORY: Effort and breath sounds normal, no problems with respiration noted ABDOMEN: Soft, nontender, nondistended, gravid. MUSCULOSKELETAL: Normal range of motion. No edema and no tenderness. 2+ distal pulses.  Cervical Exam: Dilatation 2 cm   Effacement 40%   Station -3    Presentation: cephalic FHT:  Baseline rate 130 bpm   Variability moderate  Accelerations present   Decelerations none Contractions: Every 1-4 mins   Pertinent Labs/Studies:   Results for orders placed or performed during the hospital encounter of 09/08/18 (from the past 24 hour(s))  CBC     Status: Abnormal   Collection Time: 09/08/18  8:32 PM  Result Value Ref Range   WBC 16.0 (H) 4.0 - 10.5 K/uL   RBC 4.43 3.87 - 5.11 MIL/uL   Hemoglobin 11.0 (L) 12.0 - 15.0 g/dL   HCT 82.5 (  L) 36.0 - 46.0 %   MCV 78.1 (L) 80.0 - 100.0 fL   MCH 24.8 (L) 26.0 - 34.0 pg   MCHC 31.8 30.0 - 36.0 g/dL   RDW 88.1 10.3 - 15.9 %   Platelets 251 150 - 400 K/uL   nRBC 0.0 0.0 - 0.2 %  Type and screen     Status: None (Preliminary result)   Collection Time: 09/08/18  8:32 PM  Result Value Ref Range   ABO/RH(D) PENDING    Antibody Screen PENDING    Sample Expiration      09/11/2018 Performed at [facility], [address], [place], [place] [address]     Assessment : Ashley Strickland is a 23 y.o. G1P0 at [redacted]w[redacted]d being admitted for labor induction due to morbid obesity of pregnancy.  Plan: Labor: Induction as ordered as per protocol with Cytotec. Analgesia as needed. FWB: Reassuring fetal heart tracing.  GBS negative Delivery plan: Hopeful for vaginal delivery    Hildred Laser, MD [facility]

## 2018-09-08 NOTE — Telephone Encounter (Signed)
Spoke to pt's husband and he stated that the pt Ashley Strickland foley catheter had came out. Pt was informed that [facility] would be aware and I would call them back after speaking with AC.

## 2018-09-08 NOTE — Patient Instructions (Signed)

## 2018-09-08 NOTE — Progress Notes (Signed)
ROB: Patient notes feeling nauseated. Denies other complaints. Is for foley bulb placement today prior to scheduled IOL tonight for morbid obesity in pregnancy.   FOLEY BULB PROCEDURE NOTE:    I have verified that the patient is an appropriate candidate for outpatient foley bulb placement.  She has consented to foley bulb placement today. She has no concerns at this time. A pre-procedure NST performed today was reviewed and was found to be reactive.     The patient was placed in the dorsal lithotomy position. A vaginal examination was performed and a finger was placed at the base of the cervical os.  The cervix was noted to be 1.5 cm dilated  A foley catheter was advanced into the cervix slightly pass the internal cervical os.  The foley balloon was filled with 35 cc of normal saline.  Gentle traction was placed on the foley bulb, and the catheter was taped to the medial portion of the thigh.  The patient tolerated the procedure well.  A post-procedure NST was performed. Sterile technique was observed throughout.      NONSTRESS TEST INTERPRETATION  INDICATIONS: Foley bulb induction placement  FHR baseline: 135 bpm (pre-procedure) and 130  bpm (post procedure) RESULTS:Reactive x 2.  COMMENTS: Contractions noted after foley bulb placement, q 1-3 minutes.    PLAN: 1. To arrive at Labor and Delivery for scheduled induction of labor on 09/09/2018 at midnight. She should arrive sooner if onset of labor after foley bulb expulsion.  She was given post-procedure instructions.

## 2018-09-09 ENCOUNTER — Inpatient Hospital Stay: Payer: BLUE CROSS/BLUE SHIELD | Admitting: Registered Nurse

## 2018-09-09 DIAGNOSIS — R000 Tachycardia, unspecified: Secondary | ICD-10-CM

## 2018-09-09 DIAGNOSIS — O99214 Obesity complicating childbirth: Secondary | ICD-10-CM

## 2018-09-09 DIAGNOSIS — Z3A40 40 weeks gestation of pregnancy: Secondary | ICD-10-CM

## 2018-09-09 DIAGNOSIS — O480 Post-term pregnancy: Secondary | ICD-10-CM

## 2018-09-09 DIAGNOSIS — O26893 Other specified pregnancy related conditions, third trimester: Secondary | ICD-10-CM

## 2018-09-09 DIAGNOSIS — I951 Orthostatic hypotension: Secondary | ICD-10-CM

## 2018-09-09 MED ORDER — ZOLPIDEM TARTRATE 5 MG PO TABS: 5.0000 mg | ORAL_TABLET | Freq: Every evening | ORAL | Status: DC | PRN

## 2018-09-09 MED ORDER — LIDOCAINE HCL (PF) 1 % IJ SOLN
INTRAMUSCULAR | Status: DC | PRN
  Administered 2018-09-09: 3 mL via SUBCUTANEOUS

## 2018-09-09 MED ORDER — PANTOPRAZOLE SODIUM 20 MG PO TBEC
20.0000 mg | DELAYED_RELEASE_TABLET | Freq: Two times a day (BID) | ORAL | Status: DC
  Administered 2018-09-10 – 2018-09-11 (×2): 20 mg via ORAL
  Filled 2018-09-09 (×6): qty 1

## 2018-09-09 MED ORDER — AMMONIA AROMATIC IN INHA
RESPIRATORY_TRACT | Status: AC
  Filled 2018-09-09: qty 10

## 2018-09-09 MED ORDER — SENNOSIDES-DOCUSATE SODIUM 8.6-50 MG PO TABS
2.0000 | ORAL_TABLET | ORAL | Status: DC
  Administered 2018-09-09 – 2018-09-10 (×2): 2 via ORAL
  Filled 2018-09-09 (×2): qty 2

## 2018-09-09 MED ORDER — PRENATAL MULTIVITAMIN CH: 1.0000 | ORAL_TABLET | Freq: Every day | ORAL | Status: DC

## 2018-09-09 MED ORDER — DIBUCAINE 1 % RE OINT: 1.0000 "application " | TOPICAL_OINTMENT | RECTAL | Status: DC | PRN

## 2018-09-09 MED ORDER — BENZOCAINE-MENTHOL 20-0.5 % EX AERO
1.0000 "application " | INHALATION_SPRAY | CUTANEOUS | Status: DC | PRN
  Administered 2018-09-10: 1 via TOPICAL
  Filled 2018-09-09: qty 56

## 2018-09-09 MED ORDER — SIMETHICONE 80 MG PO CHEW: 80.0000 mg | CHEWABLE_TABLET | ORAL | Status: DC | PRN

## 2018-09-09 MED ORDER — DIPHENHYDRAMINE HCL 25 MG PO CAPS: 25.0000 mg | ORAL_CAPSULE | Freq: Four times a day (QID) | ORAL | Status: DC | PRN

## 2018-09-09 MED ORDER — OXYTOCIN 10 UNIT/ML IJ SOLN
INTRAMUSCULAR | Status: AC
  Filled 2018-09-09: qty 2

## 2018-09-09 MED ORDER — COCONUT OIL OIL
1.0000 "application " | TOPICAL_OIL | Status: DC | PRN
  Administered 2018-09-11: 1 via TOPICAL
  Filled 2018-09-09: qty 120

## 2018-09-09 MED ORDER — FENTANYL 2.5 MCG/ML W/ROPIVACAINE 0.15% IN NS 100 ML EPIDURAL (ARMC)
EPIDURAL | Status: DC | PRN
  Administered 2018-09-09: 12 mL/h via EPIDURAL

## 2018-09-09 MED ORDER — IBUPROFEN 800 MG PO TABS
800.0000 mg | ORAL_TABLET | Freq: Four times a day (QID) | ORAL | Status: DC
  Administered 2018-09-09 – 2018-09-11 (×7): 800 mg via ORAL
  Filled 2018-09-09 (×8): qty 1

## 2018-09-09 MED ORDER — FENTANYL 2.5 MCG/ML W/ROPIVACAINE 0.15% IN NS 100 ML EPIDURAL (ARMC)
EPIDURAL | Status: AC
  Filled 2018-09-09: qty 100

## 2018-09-09 MED ORDER — VARICELLA VIRUS VACCINE LIVE 1350 PFU/0.5ML IJ SUSR
0.5000 mL | INTRAMUSCULAR | Status: AC | PRN
  Administered 2018-09-11: 0.5 mL via SUBCUTANEOUS
  Filled 2018-09-09: qty 0.5

## 2018-09-09 MED ORDER — ONDANSETRON HCL 4 MG PO TABS: 4.0000 mg | ORAL_TABLET | ORAL | Status: DC | PRN

## 2018-09-09 MED ORDER — PRENATAL MULTIVITAMIN CH
1.0000 | ORAL_TABLET | Freq: Every day | ORAL | Status: DC
  Administered 2018-09-10 – 2018-09-11 (×2): 1 via ORAL
  Filled 2018-09-09 (×2): qty 1

## 2018-09-09 MED ORDER — WITCH HAZEL-GLYCERIN EX PADS: 1.0000 "application " | MEDICATED_PAD | CUTANEOUS | Status: DC | PRN

## 2018-09-09 MED ORDER — MISOPROSTOL 200 MCG PO TABS
ORAL_TABLET | ORAL | Status: AC
  Filled 2018-09-09: qty 4

## 2018-09-09 MED ORDER — BUPIVACAINE HCL (PF) 0.25 % IJ SOLN
INTRAMUSCULAR | Status: DC | PRN
  Administered 2018-09-09: 3 mL via EPIDURAL
  Administered 2018-09-09: 4 mL via EPIDURAL

## 2018-09-09 MED ORDER — ACETAMINOPHEN 325 MG PO TABS
650.0000 mg | ORAL_TABLET | ORAL | Status: DC | PRN
  Administered 2018-09-09 – 2018-09-11 (×5): 650 mg via ORAL
  Filled 2018-09-09 (×5): qty 2

## 2018-09-09 MED ORDER — LIDOCAINE HCL (PF) 1 % IJ SOLN
INTRAMUSCULAR | Status: AC
  Filled 2018-09-09: qty 30

## 2018-09-09 MED ORDER — ONDANSETRON HCL 4 MG/2ML IJ SOLN: 4.0000 mg | INTRAMUSCULAR | Status: DC | PRN

## 2018-09-09 MED ORDER — LIDOCAINE-EPINEPHRINE (PF) 1.5 %-1:200000 IJ SOLN
INTRAMUSCULAR | Status: DC | PRN
  Administered 2018-09-09: 3 mL via EPIDURAL

## 2018-09-09 NOTE — Anesthesia Preprocedure Evaluation (Signed)
Anesthesia Evaluation  Patient identified by MRN, date of birth, ID band Patient awake    Reviewed: Allergy & Precautions, H&P , NPO status , Patient's Chart, lab work & pertinent test results  Airway Mallampati: II  TM Distance: >3 FB Neck ROM: full    Dental  (+) Teeth Intact   Pulmonary    Pulmonary exam normal        Cardiovascular Normal cardiovascular exam     Neuro/Psych Anxiety    GI/Hepatic GERD  Medicated and Controlled,  Endo/Other    Renal/GU      Musculoskeletal   Abdominal   Peds  Hematology  (+) Blood dyscrasia, anemia ,   Anesthesia Other Findings   Reproductive/Obstetrics (+) Pregnancy                             Anesthesia Physical Anesthesia Plan  ASA: II  Anesthesia Plan: Epidural   Post-op Pain Management:    Induction:   PONV Risk Score and Plan:   Airway Management Planned:   Additional Equipment:   Intra-op Plan:   Post-operative Plan:   Informed Consent: I have reviewed the patients History and Physical, chart, labs and discussed the procedure including the risks, benefits and alternatives for the proposed anesthesia with the patient or authorized representative who has indicated his/her understanding and acceptance.     Dental Advisory Given  Plan Discussed with: Anesthesiologist and CRNA  Anesthesia Plan Comments:         Anesthesia Quick Evaluation

## 2018-09-09 NOTE — Progress Notes (Signed)
Epic downtime [phone] see paper flow sheet and paper fetal tracing.

## 2018-09-09 NOTE — Plan of Care (Signed)
  Problem: Education: Goal: Knowledge of General Education information will improve Description Including pain rating scale, medication(s)/side effects and non-pharmacologic comfort measures Outcome: Progressing   Problem: Health Behavior/Discharge Planning: Goal: Ability to manage health-related needs will improve Outcome: Progressing   

## 2018-09-10 LAB — CBC
HEMATOCRIT: 30.2 % — AB (ref 36.0–46.0)
Hemoglobin: 9.3 g/dL — ABNORMAL LOW (ref 12.0–15.0)
MCH: 24.6 pg — ABNORMAL LOW (ref 26.0–34.0)
MCHC: 30.8 g/dL (ref 30.0–36.0)
MCV: 79.9 fL — ABNORMAL LOW (ref 80.0–100.0)
Platelets: 218 10*3/uL (ref 150–400)
RBC: 3.78 MIL/uL — ABNORMAL LOW (ref 3.87–5.11)
RDW: 14.6 % (ref 11.5–15.5)
WBC: 13.8 10*3/uL — ABNORMAL HIGH (ref 4.0–10.5)
nRBC: 0 % (ref 0.0–0.2)

## 2018-09-10 LAB — RPR: RPR Ser Ql: NONREACTIVE

## 2018-09-10 MED ORDER — HYDROCODONE-ACETAMINOPHEN 5-325 MG PO TABS
1.0000 | ORAL_TABLET | Freq: Once | ORAL | Status: AC
  Administered 2018-09-10: 1 via ORAL
  Filled 2018-09-10: qty 1

## 2018-09-10 NOTE — Lactation Note (Signed)
This note was copied from a baby's chart. Lactation Consultation Note  Patient Name: Ashley Strickland NTIRW'E Date: 09/10/2018     Maternal Data    Feeding Feeding Type: Breast Fed  LATCH Score                   Interventions    Lactation Tools Discussed/Used     Consult Status  Mother states that infant is latching well but prefers the right breast. LC gave mother information about mom's express and the lactation outpatient clinic. Mother denies any additional concerns at this time.    Arlyss Gandy 09/10/2018, 6:08 PM

## 2018-09-10 NOTE — Progress Notes (Signed)
Post Partum Day # 1, s/p SVD  Subjective: no complaints, up ad lib, voiding and tolerating PO  Objective: Temp:  [97.9 F (36.6 C)-98.6 F (37 C)] 98 F (36.7 C) (03/13 0713) Pulse Rate:  [83-121] 92 (03/13 0713) Resp:  [16-20] 18 (03/13 0713) BP: (87-127)/(40-87) 107/64 (03/13 0713) SpO2:  [97 %-100 %] 99 % (03/13 0713)  Physical Exam:  General: alert and no distress  Lungs: clear to auscultation bilaterally Breasts: normal appearance, no masses or tenderness Heart: regular rate and rhythm, S1, S2 normal, no murmur, click, rub or gallop Abdomen: soft, non-tender; bowel sounds normal; no masses,  no organomegaly Pelvis: Lochia: appropriate, Uterine Fundus: firm Extremities: DVT Evaluation: No evidence of DVT seen on physical exam. Negative Homan's sign. No cords or calf tenderness. No significant calf/ankle edema.    Recent Labs    09/08/18 2032 09/10/18 0417  HGB 11.0* 9.3*  HCT 34.6* 30.2*    Assessment/Plan: Doing well postpartum Breastfeeding, Lactation consult,  Circumcision prior to discharge  Contraception undecided. To continue discussion postpartum.  Plan for discharge tomorrow   LOS: 2 days   Hildred Laser, MD [facility] 09/10/2018 9:57 AM

## 2018-09-11 MED ORDER — MEDROXYPROGESTERONE ACETATE 150 MG/ML IM SUSP: 150.0000 mg | INTRAMUSCULAR | 4 refills | Status: DC

## 2018-09-11 MED ORDER — IBUPROFEN 800 MG PO TABS: 800.0000 mg | ORAL_TABLET | Freq: Three times a day (TID) | ORAL | 1 refills | Status: DC | PRN

## 2018-09-11 MED ORDER — MEDROXYPROGESTERONE ACETATE 150 MG/ML IM SUSP
150.0000 mg | Freq: Once | INTRAMUSCULAR | Status: AC
  Administered 2018-09-11: 150 mg via INTRAMUSCULAR
  Filled 2018-09-11: qty 1

## 2018-09-11 MED ORDER — FERROUS SULFATE 325 (65 FE) MG PO TABS: 325.0000 mg | ORAL_TABLET | Freq: Every day | ORAL | 0 refills | Status: DC

## 2018-09-11 NOTE — Clinical Social Work Maternal (Signed)
  CLINICAL SOCIAL WORK MATERNAL/CHILD NOTE  Patient Details  Name: Ashley Strickland MRN: [id number] Date of Birth: 10-22-1995  Date:  09/11/2018  Clinical Social Worker Initiating Note:  Santiago Bumpers, MSW, LCSW Date/Time: Initiated:  09/11/18/1535     Child's Name:  Ashley Strickland   Biological Parents:  Mother, Father   Need for Interpreter:  None   Reason for Referral:  Behavioral Health Concerns   Address:  [address] [place] [address]    Phone number:  [phone] (home)     Additional phone number: [phone]  Household Members/Support Persons (HM/SP):   Household Member/Support Person 1   HM/SP Name Relationship DOB or Age  HM/SP -1 Patrick Strickland FOB/Spouse    HM/SP -2        HM/SP -3        HM/SP -4        HM/SP -5        HM/SP -6        HM/SP -7        HM/SP -8          Natural Supports (not living in the home):  [place], Medical laboratory scientific officer, Extended Family, Friends, Immediate Family, Armed forces training and education officer Supports:     Employment: Agricultural engineer   Type of Work:     Education:  Programmer, systems   Homebound arranged:    Museum/gallery curator Resources:  Multimedia programmer   Other Resources:  [facility]   Cultural/Religious Considerations Which May Impact Care: None Reported  Strengths:  Ability to meet basic needs , Compliance with medical plan , Home prepared for child , Understanding of illness, Pediatrician chosen   Psychotropic Medications:         Pediatrician:    [employer]  Pediatrician List:   [place])  [facility]      Pediatrician Fax Number:    Risk Factors/Current Problems:  Mental Health Concerns    Cognitive State:  Able to Concentrate , Alert , Linear Thinking , Insightful , Goal Oriented    Mood/Affect:  Happy , Bright    CSW Assessment: The CSW met with the patient and her family at bedside. The patient verbalized  permission for her mother and husband to remain in the room during the discussion. The CSW introduced self and role in care as well as the [place] Postnatal Depression Screen and score (10). The CSW explained the heightened risk factors for the patient with regards to post partum depression or anxiety due to her score, and the patient reported that she has a history of clinical depression and anxiety with occasional vague and fleeting SI. The patient denies current SI or SI in the past 2 years. The patient also shared that she never had a plan or method in mind.   The CSW explained the s/s of postpartum depression and anxiety and advised the patient and her family of how to seek care and what warning signs to examine. The patient and her family thanked the CSW and had no further needs per their report. The CSW offered a list of resources; the patient and her family declined. The CSW is signing off. Please consult should additional needs arise.  CSW Plan/Description:  No Further Intervention Required/No Barriers to Discharge    Zettie Pho, LCSW 09/11/2018, 3:37 PM

## 2018-09-11 NOTE — Discharge Summary (Signed)
OB Discharge Summary     Patient Name: Ashley Strickland DOB: 1995/07/05 MRN: [id number]  Date of admission: 09/08/2018 Delivering MD: Hildred Laser   Date of discharge: 09/11/2018  Admitting diagnosis: 40 wks Intrauterine pregnancy: [redacted]w[redacted]d     Secondary diagnosis:  Active Problems:   Obesity complicating pregnancy in third trimester  Additional problems: POTS syndrome     Discharge diagnosis: Term Pregnancy Delivered                                                                                                Post partum procedures:None  Augmentation: AROM, Pitocin, Cytotec and Foley Balloon  Complications: None  Hospital course:  Induction of Labor With Vaginal Delivery   23 y.o. yo G1P1001 at [redacted]w[redacted]d was admitted to the hospital 09/08/2018 for induction of labor.  Indication for induction: Morbid obesity in pregnancy.  Patient had an uncomplicated labor course as follows: Membrane Rupture Time/Date: 7:56 AM ,09/09/2018   Intrapartum Procedures: Episiotomy: None [1]                                         Lacerations:  1st degree [2]  Patient had delivery of a Viable infant.  Information for the patient's newborn:  Anaely, Peraza [[id number]]  Delivery Method: Vag-Spont   09/09/2018  Details of delivery can be found in separate delivery note.  Patient had a routine postpartum course. Patient is discharged home 09/11/18.  Physical exam  Vitals:   09/10/18 1545 09/10/18 2350 09/10/18 2353 09/11/18 0801  BP: 100/69  99/63 (!) 95/59  Pulse: 90 75 80 80  Resp: 18 18 18 18   Temp: 98.3 F (36.8 C) 98.3 F (36.8 C) 98.2 F (36.8 C) 98.4 F (36.9 C)  TempSrc: Oral Oral  Oral  SpO2: 98% 99% 99% 100%  Weight:      Height:       General: alert and no distress Lochia: appropriate Uterine Fundus: firm Incision: N/A DVT Evaluation: No evidence of DVT seen on physical exam. Negative Homan's sign. No cords or calf tenderness. No significant calf/ankle  edema. Labs: Lab Results  Component Value Date   WBC 13.8 (H) 09/10/2018   HGB 9.3 (L) 09/10/2018   HCT 30.2 (L) 09/10/2018   MCV 79.9 (L) 09/10/2018   PLT 218 09/10/2018   CMP Latest Ref Rng & Units 08/30/2015  Glucose 65 - 99 mg/dL 90  BUN 6 - 20 mg/dL 13  Creatinine 8.31 - 5.17 mg/dL 6.16  Sodium 073 - 710 mmol/L 140  Potassium 3.5 - 5.1 mmol/L 3.7  Chloride 101 - 111 mmol/L 109  CO2 22 - 32 mmol/L 24  Calcium 8.9 - 10.3 mg/dL 6.2(I)  Total Protein 6.5 - 8.1 g/dL 7.2  Total Bilirubin 0.3 - 1.2 mg/dL 1.0  Alkaline Phos 47 - 119 U/L 64  AST 15 - 41 U/L 16  ALT 14 - 54 U/L 10(L)    Discharge instruction: per After Visit Summary and "Baby and Me Booklet".  After visit meds:  Allergies as of 09/11/2018      Reactions   Phenergan [promethazine Hcl] Other (See Comments)   Seizure      Medication List    STOP taking these medications   aspirin EC 81 MG tablet   Doxylamine-Pyridoxine 10-10 MG Tbec   ondansetron 4 MG disintegrating tablet Commonly known as:  ZOFRAN-ODT   pantoprazole 20 MG tablet Commonly known as:  Protonix   scopolamine 1 MG/3DAYS Commonly known as:  Transderm-Scop (1.5 MG)     TAKE these medications   ibuprofen 800 MG tablet Commonly known as:  ADVIL,MOTRIN Take 1 tablet (800 mg total) by mouth every 8 (eight) hours as needed.   medroxyPROGESTERone 150 MG/ML injection Commonly known as:  Depo-Provera Inject 1 mL (150 mg total) into the muscle every 3 (three) months.   prenatal multivitamin Tabs tablet Take 1 tablet by mouth daily at 12 noon.       Diet: routine diet  Activity: Advance as tolerated. Pelvic rest for 6 weeks.   Outpatient follow up:2 weeks Follow up Appt:No future appointments. Follow up Visit:No follow-ups on file.  Postpartum contraception: Depo Provera  Newborn Data: Live born female  Birth Weight: 7 lb 2.6 oz (3250 g) APGAR: 8, 9  Newborn Delivery   Birth date/time:  09/09/2018 12:30:00 Delivery type:   Vaginal, Spontaneous     Baby Feeding: Breast Disposition:home with mother   09/11/2018 Hildred Laser, MD

## 2018-09-11 NOTE — Progress Notes (Signed)
Discharge instructions provided.  Pt and sig other verbalize understanding of all instructions and follow-up care.  Pt discharged to home with infant at 1415 on 09/11/18 via wheelchair by NT Reynold Bowen, RN

## 2018-09-11 NOTE — Discharge Instructions (Signed)
Call your doctor for increased pain or vaginal bleeding, temperature above 100.4, depression, or concerns.  No strenuous activity or heavy lifting for 6 weeks.  No intercourse, tampons, or douching for 6 weeks.  No tub baths- showers only.  No driving for 2 weeks or while taking pain medications.  Increase calories and fluids while breastfeeding.  Continue Prenatal vitamin and iron.

## 2018-09-11 NOTE — Progress Notes (Signed)
Pt states that she received TDaP and Influenza vaccines during pregnancy.  Pt declines these vaccines at this time. Reynold Bowen, RN 09/11/2018 11:37 AM

## 2018-09-11 NOTE — Progress Notes (Signed)
Toy Cookey, LCSW contacted to notify of order for consult and EPDS score of 10.  Reynold Bowen, RN 09/11/2018 8:50 AM

## 2018-09-12 ENCOUNTER — Ambulatory Visit: Payer: Self-pay

## 2018-09-12 NOTE — Lactation Note (Signed)
This note was copied from a baby's chart. Lactation Consultation Note  Patient Name: Ashley Strickland [id number] Date: 09/12/2018   When assisted mom with breast feeding, tight frenulum noted.  Mom is able to get him latched, but not always deep enough possibly d/t frenulum issues.  Mom reports discomfort, but not as bad as she was expecting it to be.  Reported to Pediatrician.  Information brochure given and reviewed on treatment for infant tongue-tie.  Lactation community resources given and encouraged to call with any questions, concerns or further assistance with breast feeding.  Maternal Data    Feeding    LATCH Score                   Interventions    Lactation Tools Discussed/Used     Consult Status      [name] 09/12/2018, 9:44 PM

## 2018-09-13 ENCOUNTER — Other Ambulatory Visit: Payer: Self-pay

## 2018-09-13 ENCOUNTER — Encounter: Payer: Self-pay | Admitting: Emergency Medicine

## 2018-09-13 DIAGNOSIS — Z5321 Procedure and treatment not carried out due to patient leaving prior to being seen by health care provider: Secondary | ICD-10-CM | POA: Insufficient documentation

## 2018-09-13 DIAGNOSIS — R109 Unspecified abdominal pain: Secondary | ICD-10-CM | POA: Insufficient documentation

## 2018-09-13 LAB — CBC WITH DIFFERENTIAL/PLATELET
ABS IMMATURE GRANULOCYTES: 0.07 10*3/uL (ref 0.00–0.07)
BASOS PCT: 0 %
Basophils Absolute: 0 10*3/uL (ref 0.0–0.1)
Eosinophils Absolute: 0.3 10*3/uL (ref 0.0–0.5)
Eosinophils Relative: 2 %
HCT: 34.9 % — ABNORMAL LOW (ref 36.0–46.0)
Hemoglobin: 11 g/dL — ABNORMAL LOW (ref 12.0–15.0)
Immature Granulocytes: 1 %
Lymphocytes Relative: 16 %
Lymphs Abs: 2.3 10*3/uL (ref 0.7–4.0)
MCH: 24.8 pg — ABNORMAL LOW (ref 26.0–34.0)
MCHC: 31.5 g/dL (ref 30.0–36.0)
MCV: 78.6 fL — ABNORMAL LOW (ref 80.0–100.0)
Monocytes Absolute: 0.9 10*3/uL (ref 0.1–1.0)
Monocytes Relative: 6 %
NEUTROS ABS: 10.5 10*3/uL — AB (ref 1.7–7.7)
Neutrophils Relative %: 75 %
Platelets: 325 10*3/uL (ref 150–400)
RBC: 4.44 MIL/uL (ref 3.87–5.11)
RDW: 14.8 % (ref 11.5–15.5)
WBC: 14 10*3/uL — ABNORMAL HIGH (ref 4.0–10.5)
nRBC: 0 % (ref 0.0–0.2)

## 2018-09-13 NOTE — ED Triage Notes (Signed)
Pt to triage via w/c, tearful; vag delivery 3/12; c/o lower abd pain x 3-4 days; st vag bleeding persists

## 2018-09-14 ENCOUNTER — Emergency Department
Admission: EM | Admit: 2018-09-14 | Discharge: 2018-09-14 | Disposition: A | Discharge status: Home / Self Care | Payer: BLUE CROSS/BLUE SHIELD | Attending: Emergency Medicine | Admitting: Emergency Medicine

## 2018-09-14 ENCOUNTER — Ambulatory Visit: Payer: Self-pay

## 2018-09-14 ENCOUNTER — Other Ambulatory Visit: Payer: Self-pay | Admitting: Emergency Medicine

## 2018-09-14 DIAGNOSIS — O9089 Other complications of the puerperium, not elsewhere classified: Secondary | ICD-10-CM

## 2018-09-14 DIAGNOSIS — R52 Pain, unspecified: Principal | ICD-10-CM

## 2018-09-14 LAB — COMPREHENSIVE METABOLIC PANEL
ALT: 13 U/L (ref 0–44)
AST: 17 U/L (ref 15–41)
Albumin: 2.7 g/dL — ABNORMAL LOW (ref 3.5–5.0)
Alkaline Phosphatase: 109 U/L (ref 38–126)
Anion gap: 9 (ref 5–15)
BUN: 10 mg/dL (ref 6–20)
CO2: 20 mmol/L — ABNORMAL LOW (ref 22–32)
CREATININE: 0.51 mg/dL (ref 0.44–1.00)
Calcium: 8.8 mg/dL — ABNORMAL LOW (ref 8.9–10.3)
Chloride: 110 mmol/L (ref 98–111)
GFR calc Af Amer: >60 mL/min (ref >60)
GFR calc non Af Amer: >60 mL/min (ref >60)
Glucose, Bld: 113 mg/dL — ABNORMAL HIGH (ref 70–99)
Potassium: 3.8 mmol/L (ref 3.5–5.1)
Sodium: 139 mmol/L (ref 135–145)
Total Bilirubin: 0.6 mg/dL (ref 0.3–1.2)
Total Protein: 7 g/dL (ref 6.5–8.1)

## 2018-09-14 LAB — URINALYSIS, COMPLETE (UACMP) WITH MICROSCOPIC
Bacteria, UA: NONE SEEN
Bilirubin Urine: NEGATIVE
GLUCOSE, UA: NEGATIVE mg/dL
Ketones, ur: NEGATIVE mg/dL
NITRITE: NEGATIVE
Protein, ur: NEGATIVE mg/dL
RBC / HPF: >50 RBC/hpf — ABNORMAL HIGH (ref 0–5)
Specific Gravity, Urine: 1.019 (ref 1.005–1.030)
pH: 6 (ref 5.0–8.0)

## 2018-09-14 LAB — LIPASE, BLOOD: Lipase: 28 U/L (ref 11–51)

## 2018-09-14 NOTE — Lactation Note (Signed)
This note was copied from a baby's chart. Lactation Consultation Note  Patient Name: Ashley Strickland [id number] Date: 09/14/2018  BAby has tight frenulum, cannot extend tongue past gumline, mom has sore nipples, has been pumping only since discharge, baby has been taking 2-3 oz per feeding q 2-3 hrs and having lots of wets and breastmilk stools, birthdate is [birth year], birth wt was 7-3, wt yesterday at [facility] peds was 7-0.  Wt before feed was 3186 with diaper only, wt after was 3308 with diaper only, wt with t-shirt was 7-5.9,         Maternal Data  red areas on tip of both nipples, mom reports no pain while using nipple shield, size 20 mm which mom purchased herself at [facility], breasts firm but soften with feeding Feeding  Baby latched well with 20 mm nipple shield and nursed well on both breasts 10- 15 mi per breast, took in 122 cc per pre/post wt scale  LATCH Score                   Interventions  mom instructed in use of nipple shield, application and cleaning  Lactation Tools Discussed/Used  nipple shield, using breast pump to remove milk if breasts still uncomfortable after nursing or in between    Consult Status  [facility] is contacting ENT to arrange appt to check frenulum, mom awaiting call, also has info.of pediatric dentist in [place], mom informed she can have consult after frenulum clipped or lasered or any problems with shield or to help baby wean from shield or other problems or concerns     Dyann Kief 09/14/2018, 2:54 PM

## 2018-09-14 NOTE — Anesthesia Postprocedure Evaluation (Signed)
Anesthesia Post Note  Patient: Ashley Strickland  Procedure(s) Performed: AN AD HOC LABOR EPIDURAL  Patient location during evaluation: PACU Anesthesia Type: Epidural Level of consciousness: awake and alert Pain management: pain level controlled Vital Signs Assessment: post-procedure vital signs reviewed and stable Respiratory status: spontaneous breathing, nonlabored ventilation, respiratory function stable and patient connected to nasal cannula oxygen Cardiovascular status: blood pressure returned to baseline and stable Postop Assessment: no apparent nausea or vomiting Anesthetic complications: no     Last Vitals: There were no vitals filed for this visit.  Last Pain: There were no vitals filed for this visit.               Yevette Edwards

## 2018-09-21 ENCOUNTER — Telehealth: Payer: Self-pay

## 2018-09-21 NOTE — Telephone Encounter (Signed)
Pt called to see if she could come in earlier on the 26th of March. Pt stated that she would have to ask her husband since he had to bring her. Pt was advise to please call back as soon as she know. Pt was informed that there were also morning appointments available as well.

## 2018-09-21 NOTE — Telephone Encounter (Signed)
Pt called to see if she could come in earlier on the day of her appointment. Pt stated that she asked her husband but he did not reply. Pt was informed of what we were trying to do with having all appointments in the morning and do telephone visits in the evening. Pt was asked if she could come in the morning on another day during the week. Pt stated that she would speak to her husband to see.

## 2018-09-22 NOTE — Progress Notes (Signed)
Pt here today for 2 week postpartum visit. Pt stated that she was feeling well but her depression screen score was EPDS=22.

## 2018-09-23 ENCOUNTER — Encounter: Payer: Self-pay | Admitting: Obstetrics and Gynecology

## 2018-09-23 ENCOUNTER — Encounter: Payer: BLUE CROSS/BLUE SHIELD | Admitting: Obstetrics and Gynecology

## 2018-09-23 ENCOUNTER — Other Ambulatory Visit: Payer: Self-pay

## 2018-09-23 ENCOUNTER — Ambulatory Visit (INDEPENDENT_AMBULATORY_CARE_PROVIDER_SITE_OTHER): Payer: BLUE CROSS/BLUE SHIELD | Admitting: Obstetrics and Gynecology

## 2018-09-23 VITALS — BP 117/82 | HR 84 | Ht 62.0 in | Wt 222.9 lb

## 2018-09-23 DIAGNOSIS — O924 Hypogalactia: Secondary | ICD-10-CM

## 2018-09-23 DIAGNOSIS — F419 Anxiety disorder, unspecified: Secondary | ICD-10-CM

## 2018-09-23 DIAGNOSIS — F329 Major depressive disorder, single episode, unspecified: Secondary | ICD-10-CM

## 2018-09-23 MED ORDER — BUPROPION HCL ER (XL) 150 MG PO TB24: 150.0000 mg | ORAL_TABLET | Freq: Every day | ORAL | 3 refills | Status: DC

## 2018-09-23 NOTE — Patient Instructions (Signed)

## 2018-09-23 NOTE — Progress Notes (Signed)
    GYNECOLOGY PROGRESS NOTE  Subjective:    Patient ID: Ashley Strickland, female    DOB: [birth year], 23 y.o.   MRN: [id number]  HPI  Patient is a 23 y.o. G32P1001 female who presents for 2 week postpartum follow up.  She has a history of depression prior to pregnancy, and had a mildly elevated score on her EPDS screen while inpatient (score was 10).  Today patient currently denies any issues, however EPDS screen today was 22.  She feels like she has a good support system.  Denies SI/HI. Notes that she is breastfeeding but noting a decrease in production as she is not pumping as much, however her baby is now cluster-feeding.  Is considering supplementation. Contraception: patient received Depo Provera in hospital. Is planning to continue use postpartum.   The following portions of the patient's history were reviewed and updated as appropriate: allergies, current medications, past family history, past medical history, past social history, past surgical history and problem list.  Review of Systems Pertinent items noted in HPI and remainder of comprehensive ROS otherwise negative.   Objective:   Blood pressure 117/82, pulse 84, height 5\' 2"  (1.575 m), weight 222 lb 14.4 oz (101.1 kg), currently breastfeeding. General appearance: alert and no distress Abdomen: soft, non-tender; bowel sounds normal; no masses,  no organomegaly. Fundus between pubic symphysis and umbilicus.  Pelvic: normal external genitalia. Scant dark red blood in vaginal vault. Cervix appears normal, no lesions. Bimanual exam not performed.  Extremities: extremities normal, atraumatic, no cyanosis or edema Neurologic: Grossly normal  Psychiatric: mildly blunted affect, normal thought process and speech.    Assessment:   Perinatal depression Anxiety Postpartum state Decreased milk supply  Plan:   1. Perinatal depression - Patient reports long history of depression. Has used both counseling and medication in the past  which she did not feel really helped. Notes that most times she self d/c'd her medications due to feeling "like a zombie".  Cannot recall the names of the medications she has tried in the past. Due to high score, recommended counseling, medications, or both. Discussed option of use of a second line medication like Wellbutrin.  Patient states that she is ok to try.  Still declines counseling at this time. Will prescribe daily Wellbutrin. To f/u in 4 weeks at final postpartum visit.  2. Anxiety noted by GAD-7 score of 19.  Will also reassess once patient has initiated Wellbutrin to re-evaluate symptoms on meds.  3. Postpartum state - overall doing well, notes bleeding is improving and physically she is doing well. Continued to encourage pelvic rest at this time until final postpartum visit.  4. Decreased milk supply - discussed natural remedies including Fennugreek, blessed thistle, and brewer's yeast.  Also advised to go online for recipes for lactational cookies and herbal teas.  Encouraged to increase her pumping. Lastly, can prescribe Reglan if all other measures are unsuccessful.    RTC in 4 weeks for final postpartum visit. Will f/u on perinatal depression at that time.    Hildred Laser, MD [facility]

## 2018-10-04 NOTE — Telephone Encounter (Signed)
We will get the patient an outpatient lactation visit/televisit.  The patient needs to contact them at [phone] and request and let them know her symptoms and if she would like a televisit or in person visit (or leave a message and they will call her back same day). I will put in the consult in Epic.   Dr. Valentino Saxon

## 2018-10-06 ENCOUNTER — Other Ambulatory Visit: Payer: Self-pay | Admitting: Obstetrics and Gynecology

## 2018-10-06 ENCOUNTER — Encounter: Payer: Self-pay | Admitting: Obstetrics and Gynecology

## 2018-10-06 MED ORDER — FLUCONAZOLE 150 MG PO TABS: 150.0000 mg | ORAL_TABLET | Freq: Every day | ORAL | 0 refills | Status: DC

## 2018-10-06 NOTE — Telephone Encounter (Signed)
After [facility] consultation, it appears that patient has thrush and so does baby. This was based upon patient stating that baby has diaper rash, white patch in mouth, and deep pain in patient's breasts during nursing sessions. LC contacted patient's provider and OB will call in rx for antifungal tx over course of 14 days.

## 2018-10-11 ENCOUNTER — Telehealth: Payer: Self-pay | Admitting: Obstetrics and Gynecology

## 2018-10-11 NOTE — Telephone Encounter (Signed)
Patient left a vm stating she believes she is having an allergic reaction to a medication. She states she has broke out in a rash. She would like a call back.Thanks

## 2018-10-11 NOTE — Telephone Encounter (Signed)
Pt was called back to see what types of symptoms she was having. Pt stated that she was having itching and a headache. Spoke with [facility] and she advised pt to take benadryl for the itching and tylenol for her headaches. Pt was advised if she start noticing SOB or throat tightness to immed. Stop the medication and go to the ED.

## 2018-10-19 NOTE — Progress Notes (Signed)
PT is present today for her postpartum visit. Pt stated that she is breastfeeding.  Pt is currently taking depo injections for birth control. EPDS= 9.  Pt stated that she is doing well.

## 2018-10-21 ENCOUNTER — Other Ambulatory Visit: Payer: Self-pay

## 2018-10-21 ENCOUNTER — Encounter: Payer: Self-pay | Admitting: Obstetrics and Gynecology

## 2018-10-21 ENCOUNTER — Ambulatory Visit (INDEPENDENT_AMBULATORY_CARE_PROVIDER_SITE_OTHER): Payer: BLUE CROSS/BLUE SHIELD | Admitting: Obstetrics and Gynecology

## 2018-10-21 DIAGNOSIS — Z3042 Encounter for surveillance of injectable contraceptive: Secondary | ICD-10-CM

## 2018-10-21 DIAGNOSIS — Z1389 Encounter for screening for other disorder: Secondary | ICD-10-CM

## 2018-10-21 DIAGNOSIS — Z8659 Personal history of other mental and behavioral disorders: Secondary | ICD-10-CM

## 2018-10-21 NOTE — Progress Notes (Signed)
   OBSTETRICS POSTPARTUM CLINIC PROGRESS NOTE  Subjective:     Ashley Strickland is a 23 y.o. G27P1001 female who presents for a postpartum visit. She is 6 weeks postpartum following a spontaneous vaginal delivery. I have fully reviewed the prenatal and intrapartum course. The delivery was at 40 gestational weeks.  Anesthesia: epidural. Postpartum course has been well. Baby's course has been well. Baby is feeding by breast. Bleeding: patient has/has not resumed menses, with No LMP recorded. Patient has had an injection.. Bowel function is normal. Bladder function is normal. Patient is sexually active. Contraception method desired is Depo-Provera injections (received initial injection at discharge from hospital). Postpartum depression screening: negative (EPDS score is 9), currently on Wellbutrin XL 150 mg, has improved slightly since last visit.  The following portions of the patient's history were reviewed and updated as appropriate: allergies, current medications, past family history, past medical history, past social history, past surgical history and problem list.  Review of Systems Pertinent items noted in HPI and remainder of comprehensive ROS otherwise negative.   Objective:    BP 110/75   Pulse 66   Ht 5\' 2"  (1.575 m)   Wt 228 lb 6.4 oz (103.6 kg)   BMI 41.77 kg/m   General:  alert and no distress   Breasts:  inspection negative, no nipple discharge or bleeding, no masses or nodularity palpable  Lungs: clear to auscultation bilaterally  Heart:  regular rate and rhythm, S1, S2 normal, no murmur, click, rub or gallop  Abdomen: soft, non-tender; bowel sounds normal; no masses,  no organomegaly.    Vulva:  normal  Vagina: normal vagina, no discharge, exudate, lesion, or erythema  Cervix:  no cervical motion tenderness and no lesions  Corpus: normal size, contour, position, consistency, mobility, non-tender  Adnexa:  normal adnexa and no mass, fullness, tenderness  Rectal Exam: Not  performed.         Labs:  Lab Results  Component Value Date   HGB 11.0 (L) 09/13/2018     Assessment:   1. Postpartum care following vaginal delivery 2. History of depression  Plan:    1. Contraception: Depo-Provera injections. Next injection due in June.  2. Continue Wellbutrin as patient is noting improvement in symptoms.  3. Follow up in: 8 months for annual exam, or sooner as needed.    Hildred Laser, MD [facility]
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
## 2018-11-30 ENCOUNTER — Other Ambulatory Visit: Payer: Self-pay

## 2018-11-30 ENCOUNTER — Ambulatory Visit (INDEPENDENT_AMBULATORY_CARE_PROVIDER_SITE_OTHER): Payer: BC Managed Care – PPO | Admitting: Obstetrics and Gynecology

## 2018-11-30 VITALS — BP 91/56 | HR 140 | Ht 61.0 in | Wt 230.8 lb

## 2018-11-30 DIAGNOSIS — Z3042 Encounter for surveillance of injectable contraceptive: Secondary | ICD-10-CM | POA: Diagnosis not present

## 2018-11-30 MED ORDER — MEDROXYPROGESTERONE ACETATE 150 MG/ML IM SUSP
150.0000 mg | Freq: Once | INTRAMUSCULAR | Status: AC
  Administered 2018-11-30: 150 mg via INTRAMUSCULAR

## 2018-11-30 NOTE — Progress Notes (Signed)
Date last pap: N/A. Last Depo-Provera: 09/11/2018. Side Effects if any: Denies any side effects. Serum HCG indicated? Not done, patient is within dates. Depo-Provera 150 mg IM given by: Park Meo, CMA. Next appointment for Depo due between August 18-March 01, 2019.   BP (!) 91/56   Pulse (!) 140   Ht 5\' 1"  (1.549 m)   Wt 230 lb 12.8 oz (104.7 kg)   BMI 43.61 kg/m

## 2018-12-01 ENCOUNTER — Ambulatory Visit: Payer: BLUE CROSS/BLUE SHIELD

## 2018-12-01 ENCOUNTER — Telehealth: Payer: Self-pay

## 2018-12-01 NOTE — Telephone Encounter (Signed)
Pt called no answer LM to call the office for prescreening and inform her of the office policies.

## 2018-12-02 ENCOUNTER — Other Ambulatory Visit: Payer: Self-pay

## 2018-12-02 ENCOUNTER — Ambulatory Visit (INDEPENDENT_AMBULATORY_CARE_PROVIDER_SITE_OTHER): Payer: BC Managed Care – PPO | Admitting: Obstetrics and Gynecology

## 2018-12-02 ENCOUNTER — Encounter: Payer: Self-pay | Admitting: Obstetrics and Gynecology

## 2018-12-02 VITALS — BP 88/63 | HR 102 | Ht 61.0 in | Wt 232.9 lb

## 2018-12-02 DIAGNOSIS — F53 Postpartum depression: Secondary | ICD-10-CM

## 2018-12-02 DIAGNOSIS — O99345 Other mental disorders complicating the puerperium: Secondary | ICD-10-CM

## 2018-12-02 DIAGNOSIS — F419 Anxiety disorder, unspecified: Secondary | ICD-10-CM | POA: Diagnosis not present

## 2018-12-02 MED ORDER — BUSPIRONE HCL 7.5 MG PO TABS: 7.5000 mg | ORAL_TABLET | Freq: Two times a day (BID) | ORAL | 2 refills | Status: DC

## 2018-12-02 MED ORDER — BUPROPION HCL ER (XL) 300 MG PO TB24: 300.0000 mg | ORAL_TABLET | Freq: Every day | ORAL | 3 refills | Status: DC

## 2018-12-02 NOTE — Progress Notes (Signed)
    GYNECOLOGY PROGRESS NOTE  Subjective:    Patient ID: Ashley Strickland, female    DOB: 1995/10/04, 23 y.o.   MRN: [id number]  HPI  Patient is a 23 y.o. G10P1001 female who presents for discussion of medications. Currently on Wellbutrin XL for postpartum depression, had been working well for her until recently, ~ 1 month ago. Notes symptoms of depression had returned.  Also notes that she is feeling anxious about "everything that is going on in the world right now".   EPDS score is 23. Denies SI/HI.  The following portions of the patient's history were reviewed and updated as appropriate: allergies, current medications, past family history, past medical history, past social history, past surgical history and problem list.  Review of Systems Pertinent items noted in HPI and remainder of comprehensive ROS otherwise negative.   Objective:   Blood pressure (!) 88/63, pulse (!) 102, height 5\' 1"  (1.549 m), weight 232 lb 14.4 oz (105.6 kg), currently breastfeeding. General appearance: alert and no distress Remainder of exam deferred.    Assessment:   Postpartum depression Anxiety  Plan:   1. Discussion had regarding symptoms. Would recommend increasing her Wellbutrin as it was previously working well for her. Can also consider counseling.  2. Anxiety, mostly due to generalized anxiety about overall life, society, COVID pandemic.  Discussed that during these uncertain times it is understandable that she feel this way. Will wait at least 2 weeks after increasing her Wellbutrin to see if this will help her symptoms. If it does not, also prescribed Buspar.   Patient to f/u in 4-6 weeks for follow up.     Hildred Laser, MD [facility]

## 2018-12-02 NOTE — Progress Notes (Signed)
Pt is present today for a follow up for Postpartum depression. Pt stated that the Wellburitin was working but now it is not. EPDS=23

## 2018-12-02 NOTE — Patient Instructions (Signed)
Perinatal Anxiety When a woman feels excessive tension or worry (anxiety) during pregnancy or during the first 12 months after she gives birth, she has a condition called perinatal anxiety. Anxiety can interfere with work, school, relationships, and other everyday activities. If it is not managed properly, it can also cause problems in the mother and her baby.  If you are pregnant and you have symptoms of an anxiety disorder, it is important to talk with your health care provider. What are the causes? The exact cause of this condition is not known. Hormonal changes during and after pregnancy may play a role in causing perinatal anxiety. What increases the risk? You are more likely to develop this condition if:  You have a personal or family history of depression, anxiety, or mood disorders.  You experience a stressful life event during pregnancy, such as the death of a loved one.  You have a lot of regular life stress, such as being a single parent.  You have thyroid problems. What are the signs or symptoms? Perinatal anxiety can be different for everyone. It may include:  Panic attacks (panic disorder). These are intense episodes of fear or discomfort that may also cause sweating, nausea, shortness of breath, or fear of dying. They usually last 5-15 minutes.  Reliving an upsetting (traumatic) event through distressing thoughts, dreams, or flashbacks (post-traumatic stress disorder, or PTSD).  Excessive worry about multiple problems (generalized anxiety disorder).  Fear and stress about leaving certain people or loved ones (separation anxiety).  Performing repetitive tasks (compulsions) to relieve stress or worry (obsessive compulsive disorder, or OCD).  Fear of certain objects or situations (phobias).  Excessive worrying, such as a constant feeling that something bad is going to happen.  Inability to relax.  Difficulty concentrating.  Sleep problems.  Frequent nightmares or  disturbing thoughts. How is this diagnosed? This condition is diagnosed based on a physical exam and mental evaluation. In some cases, your health care provider may use an anxiety screening tool. These tools include a list of questions that can help a health care provider diagnose anxiety. Your health care provider may refer you to a mental health expert who specializes in anxiety. How is this treated? This condition may be treated with:  Medicines. Your health care provider will only give you medicines that have been proven safe for pregnancy and breastfeeding.  Talk therapy with a mental health professional to help change your patterns of thinking (cognitive behavioral therapy).  Mindfulness-based stress reduction.  Other relaxation therapies, such as deep breathing or guided muscle relaxation.  Support groups. Follow these instructions at home: Lifestyle  Do not use any products that contain nicotine or tobacco, such as cigarettes and e-cigarettes. If you need help quitting, ask your health care provider.  Do not use alcohol when you are pregnant. After your baby is born, limit alcohol intake to no more than 1 drink a day. One drink equals 12 oz of beer, 5 oz of wine, or 1 oz of hard liquor.  Consider joining a support group for new mothers. Ask your health care provider for recommendations.  Take good care of yourself. Make sure you: ? Get plenty of sleep. If you are having trouble sleeping, talk with your health care provider. ? Eat a healthy diet. This includes plenty of fruits and vegetables, whole grains, and lean proteins. ? Exercise regularly, as told by your health care provider. Ask your health care provider what exercises are safe for you. General instructions  Take over-the-counter  healthy diet. This includes plenty of fruits and vegetables, whole grains, and lean proteins.  ? Exercise regularly, as told by your health care provider. Ask your health care provider what exercises are safe for you.  General instructions  · Take over-the-counter and prescription medicines only as told by your health care provider.  · Talk with your partner or family members about your feelings during pregnancy. Share any concerns or fears that you may  have.  · Ask for help with tasks or chores when you need it. Ask friends and family members to provide meals, watch your children, or help with cleaning.  · Keep all follow-up visits as told by your health care provider. This is important.  Contact a health care provider if:  · You (or people close to you) notice that you have any symptoms of anxiety or depression.  · You have anxiety and your symptoms get worse.  · You experience side effects from medicines, such as nausea or sleep problems.  Get help right away if:  · You feel like hurting yourself, your baby, or someone else.  If you ever feel like you may hurt yourself or others, or have thoughts about taking your own life, get help right away. You can go to your nearest emergency department or call:  · Your local emergency services (911 in the [place]  · A suicide crisis helpline, such as the National Suicide Prevention Lifeline at 1-800-273-8255. This is open 24 hours a day.  Summary  · Perinatal anxiety is when a woman feels excessive tension or worry during pregnancy or during the first 12 months after she gives birth.  · Perinatal anxiety may include panic attacks, post-traumatic stress disorder, separation anxiety, phobias, or generalized anxiety.  · Perinatal anxiety can cause physical health problems in the mother and baby if not properly managed.  · This condition is treated with medicines, talk therapy, stress reduction therapies, or a combination of two or more treatments.  · Talk with your partner or family members about your concerns or fears. Do not be afraid to ask for help.  This information is not intended to replace advice given to you by your health care provider. Make sure you discuss any questions you have with your health care provider.  Document Released: 08/13/2016 Document Revised: 08/13/2016 Document Reviewed: 08/13/2016  Elsevier Interactive Patient Education © 2019 Elsevier Inc.      Perinatal Depression  When a woman feels excessive  sadness, anger, or anxiety during pregnancy or during the first 12 months after she gives birth, she has a condition called perinatal depression. Depression can interfere with work, school, relationships, and other everyday activities. If it is not managed properly, it can also cause problems in the mother and her baby.  Sometimes, perinatal depression is left untreated because symptoms are thought to be normal mood swings during and right after pregnancy. If you have symptoms of depression, it is important to talk with your health care provider.  What are the causes?  The exact cause of this condition is not known. Hormonal changes during and after pregnancy may play a role in causing perinatal depression.  What increases the risk?  You are more likely to develop this condition if:  · You have a personal or family history of depression, anxiety, or mood disorders.  · You experience a stressful life event during pregnancy, such as the death of a loved one.  · You have a lot of regular life stress.  ·   You do not have support from family members or loved ones, or you are in an abusive relationship.  What are the signs or symptoms?  Symptoms of this condition include:  · Feeling sad or hopeless.  · Feelings of guilt.  · Feeling irritable or overwhelmed.  · Changes in your appetite.  · Lack of energy or motivation.  · Sleep problems.  · Difficulty concentrating or completing tasks.  · Loss of interest in hobbies or relationships.  · Headaches or stomach problems that do not go away.  How is this diagnosed?  This condition is diagnosed based on a physical exam and mental evaluation. In some cases, your health care provider may use a depression screening tool. These tools include a list of questions that can help a health care provider diagnose depression. Your health care provider may refer you to a mental health expert who specializes in depression.  How is this treated?  This condition may be treated  with:  · Medicines. Your health care provider will only give you medicines that have been proven safe for pregnancy and breastfeeding.  · Talk therapy with a mental health professional to help change your patterns of thinking (cognitive behavioral therapy).  · Support groups.  · Brain stimulation or light therapies.  · Stress reduction therapies, such as mindfulness.  Follow these instructions at home:  Lifestyle  · Do not use any products that contain nicotine or tobacco, such as cigarettes and e-cigarettes. If you need help quitting, ask your health care provider.  · Do not use alcohol when you are pregnant. After your baby is born, limit alcohol intake to no more than 1 drink a day. One drink equals 12 oz of beer, 5 oz of wine, or 1½ oz of hard liquor.  · Consider joining a support group for new mothers. Ask your health care provider for recommendations.  · Take good care of yourself. Make sure you:  ? Get plenty of sleep. If you are having trouble sleeping, talk with your health care provider.  ? Eat a healthy diet. This includes plenty of fruits and vegetables, whole grains, and lean proteins.  ? Exercise regularly, as told by your health care provider. Ask your health care provider what exercises are safe for you.  General instructions  · Take over-the-counter and prescription medicines only as told by your health care provider.  · Talk with your partner or family members about your feelings during pregnancy. Share any concerns or anxieties that you may have.  · Ask for help with tasks or chores when you need it. Ask friends and family members to provide meals, watch your children, or help with cleaning.  · Keep all follow-up visits as told by your health care provider. This is important.  Contact a health care provider if:  · You (or people close to you) notice that you have any symptoms of depression.  · You have depression and your symptoms get worse.  · You experience side effects from medicines, such as  nausea or sleep problems.  Get help right away if:  · You feel like hurting yourself, your baby, or someone else.  If you ever feel like you may hurt yourself or others, or have thoughts about taking your own life, get help right away. You can go to your nearest emergency department or call:  · Your local emergency services (911 in the [place]  · A suicide crisis helpline, such as the National Suicide Prevention Lifeline at 1-800-273-8255.   Document Released: 08/13/2016 Document Revised: 08/13/2016 Document Reviewed: 08/13/2016 Elsevier Interactive Patient Education  [employer].

## 2019-01-04 ENCOUNTER — Other Ambulatory Visit: Payer: Self-pay

## 2019-01-04 ENCOUNTER — Encounter: Payer: Self-pay | Admitting: Obstetrics and Gynecology

## 2019-01-04 ENCOUNTER — Ambulatory Visit (INDEPENDENT_AMBULATORY_CARE_PROVIDER_SITE_OTHER): Payer: BC Managed Care – PPO | Admitting: Obstetrics and Gynecology

## 2019-01-04 VITALS — BP 131/99 | HR 104 | Ht 61.0 in | Wt 234.6 lb

## 2019-01-04 DIAGNOSIS — G90A Postural orthostatic tachycardia syndrome (POTS): Secondary | ICD-10-CM

## 2019-01-04 DIAGNOSIS — F419 Anxiety disorder, unspecified: Secondary | ICD-10-CM

## 2019-01-04 DIAGNOSIS — O99345 Other mental disorders complicating the puerperium: Secondary | ICD-10-CM

## 2019-01-04 DIAGNOSIS — F53 Postpartum depression: Secondary | ICD-10-CM

## 2019-01-04 DIAGNOSIS — R000 Tachycardia, unspecified: Secondary | ICD-10-CM | POA: Diagnosis not present

## 2019-01-04 DIAGNOSIS — Z7689 Persons encountering health services in other specified circumstances: Secondary | ICD-10-CM

## 2019-01-04 DIAGNOSIS — I951 Orthostatic hypotension: Secondary | ICD-10-CM

## 2019-01-04 NOTE — Progress Notes (Signed)
Pt is present for post partum depression and anxiety. Pt increased her Wellbutrin XL on 12/02/18. Pt stated that she stopped taking the Wellbutrin about 2 weeks ago due to being really moody. Pt stated that she is also is not taking Buspar as well. 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  EPDS=14. GAD-7=18. PHQ-9=20.

## 2019-01-04 NOTE — Progress Notes (Signed)
    GYNECOLOGY PROGRESS NOTE  Subjective:    Patient ID: Ashley Strickland, female    DOB: 1996-05-24, 23 y.o.   MRN: [id number]  HPI  Patient is a 23 y.o. G41P1001 female who presents for 4-6 week follow up postpartum depression.  At last visit, patient's Wellbutrin dose was increased due to persistent symptoms, however patient states that she felt the medication was just making her more moody and combative with her boyfriend so she discontinued ~ 2 weeks ago.  She states that the moodiness has eased some since discontinuation. Denies SI/HI.  EPDS score is 14.   Of note, patient also reports that she never started use of the Buspar for her anxiety.  Reports that she noted that one of the side effects was mood changes, and since she was already experiencing this, she opted not to take it. GAD score is 20.   Lastly, patient reports that she feels that her POTS symptoms have worsened over the past month.  She is beginning to experience more migraines, also having light-headedness, palpitations, and chest discomfort (her typical POTS symptoms).  She denies shortness of breath or chest pain.  Notes that she has not been seen by a Cardiologist since diagnosis in 2015 or 2016, and has not seen a PCP in at least 2 years.  Is also looking to establish with a new PCP.    The following portions of the patient's history were reviewed and updated as appropriate: allergies, current medications, past family history, past medical history, past social history, past surgical history and problem list.  Review of Systems Pertinent items noted in HPI and remainder of comprehensive ROS otherwise negative.   Objective:   Blood pressure (!) 131/99, pulse (!) 104, height 5\' 1"  (1.549 m), weight 234 lb 9.6 oz (106.4 kg), currently breastfeeding. General appearance: alert and no distress Remainder of exam deferred.    Assessment:   Postpartum depression Anxiety  POTS (postural orthostatic tachycardia syndrome)   Establishing care with new doctor, encounter for  Plan:   1. Postpartum depression and anxiety - discussed option of trying new antidepressant/anxiolytic medication vs referral for counseling, or use of both. Patient notes that she would prefer to stay away from medications at this time. Would prefer counseling. Referral placed.  Strongly encouraged that if patient ever requires use of medications again, to taper instead of abruptly quitting as this can lead to rebound symptoms.  2. POTS - discussed need for f/u with Cardiology (and perhaps establishing with a PCP for management).  Referrals placed.  3. Patient looking to establish care with a new primary doctor. Will refer to a PCP.   Return to clinic for any scheduled appointments or for any gynecologic concerns as needed. Return if symptoms worsen prior to establishing with new prviders.     A total of 15 minutes were spent face-to-face with the patient during this encounter and over half of that time dealt with counseling and coordination of care.   Rubie Maid, MD [facility]

## 2019-01-15 IMAGING — DX DG ANKLE COMPLETE 3+V*R*
3 series · 3 of 3 positions shown · non-contrast
Comparison: None.

CLINICAL DATA: Pain, bruising, and swelling secondary to a twisting
injury 2 days ago.

EXAM:
RIGHT ANKLE - COMPLETE 3+ VIEW

[ankle ap]
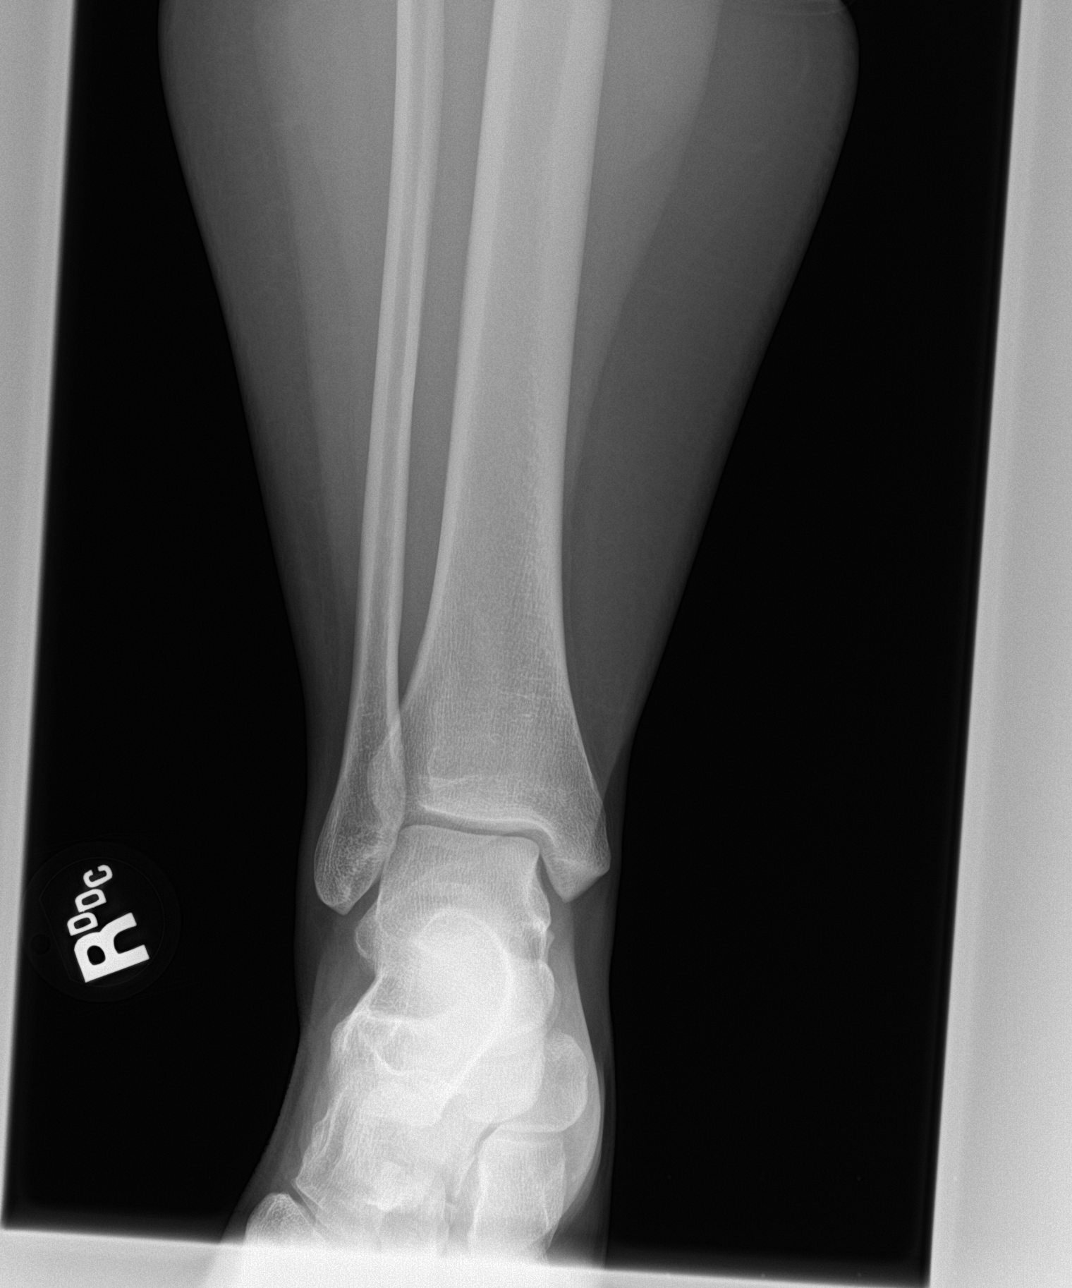

[ankle obl]
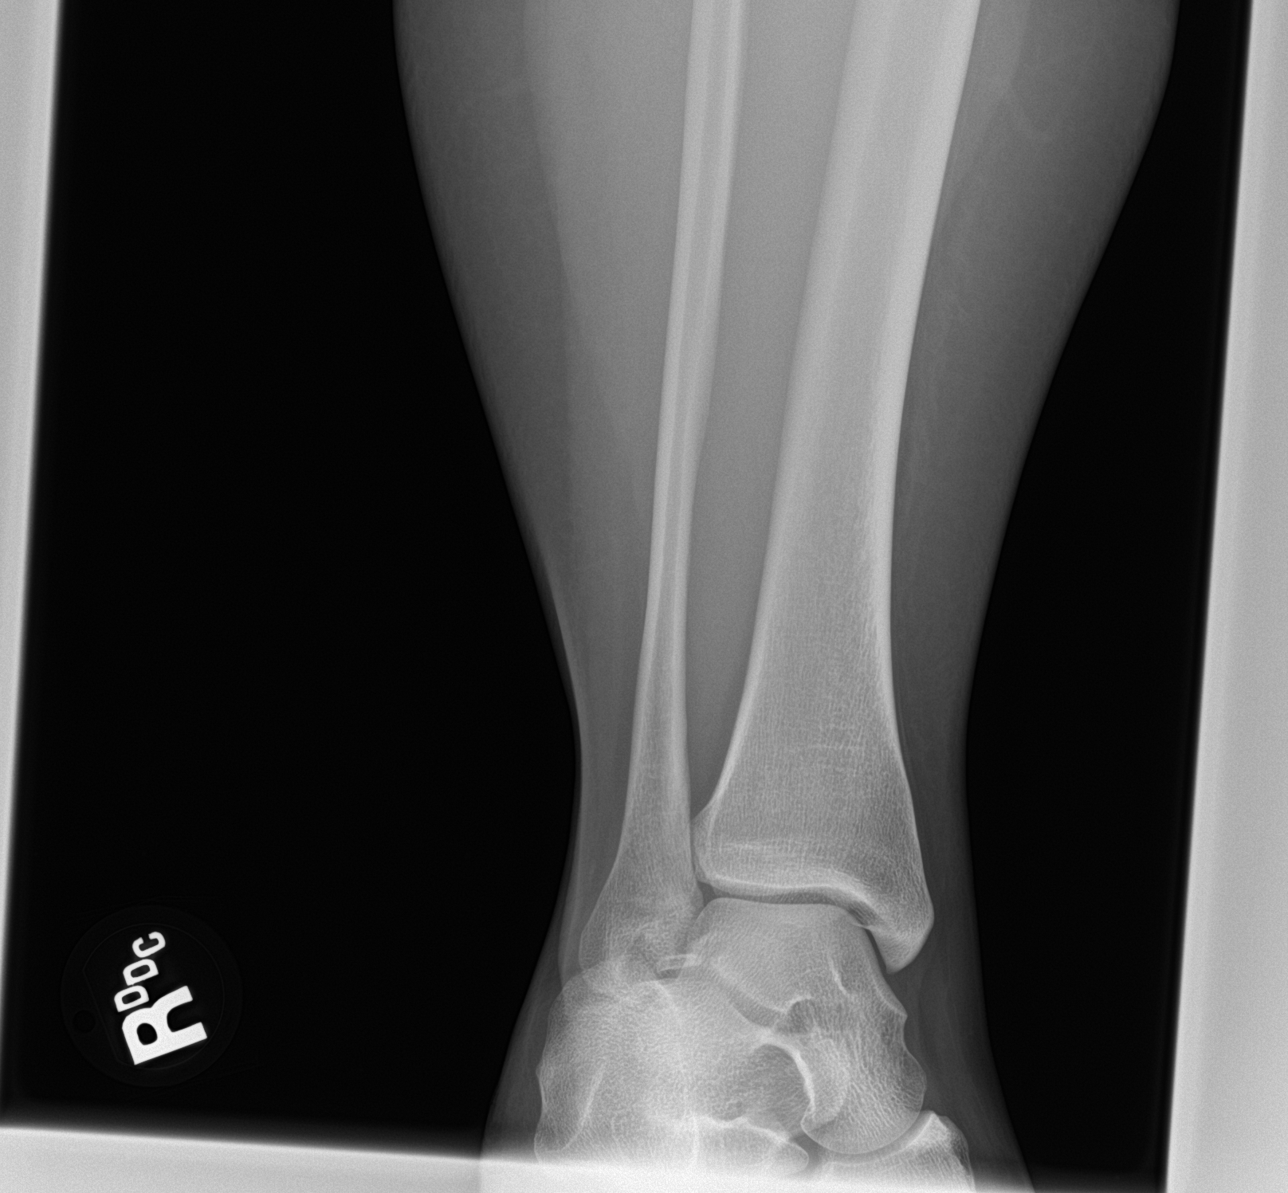

[ankle lat]
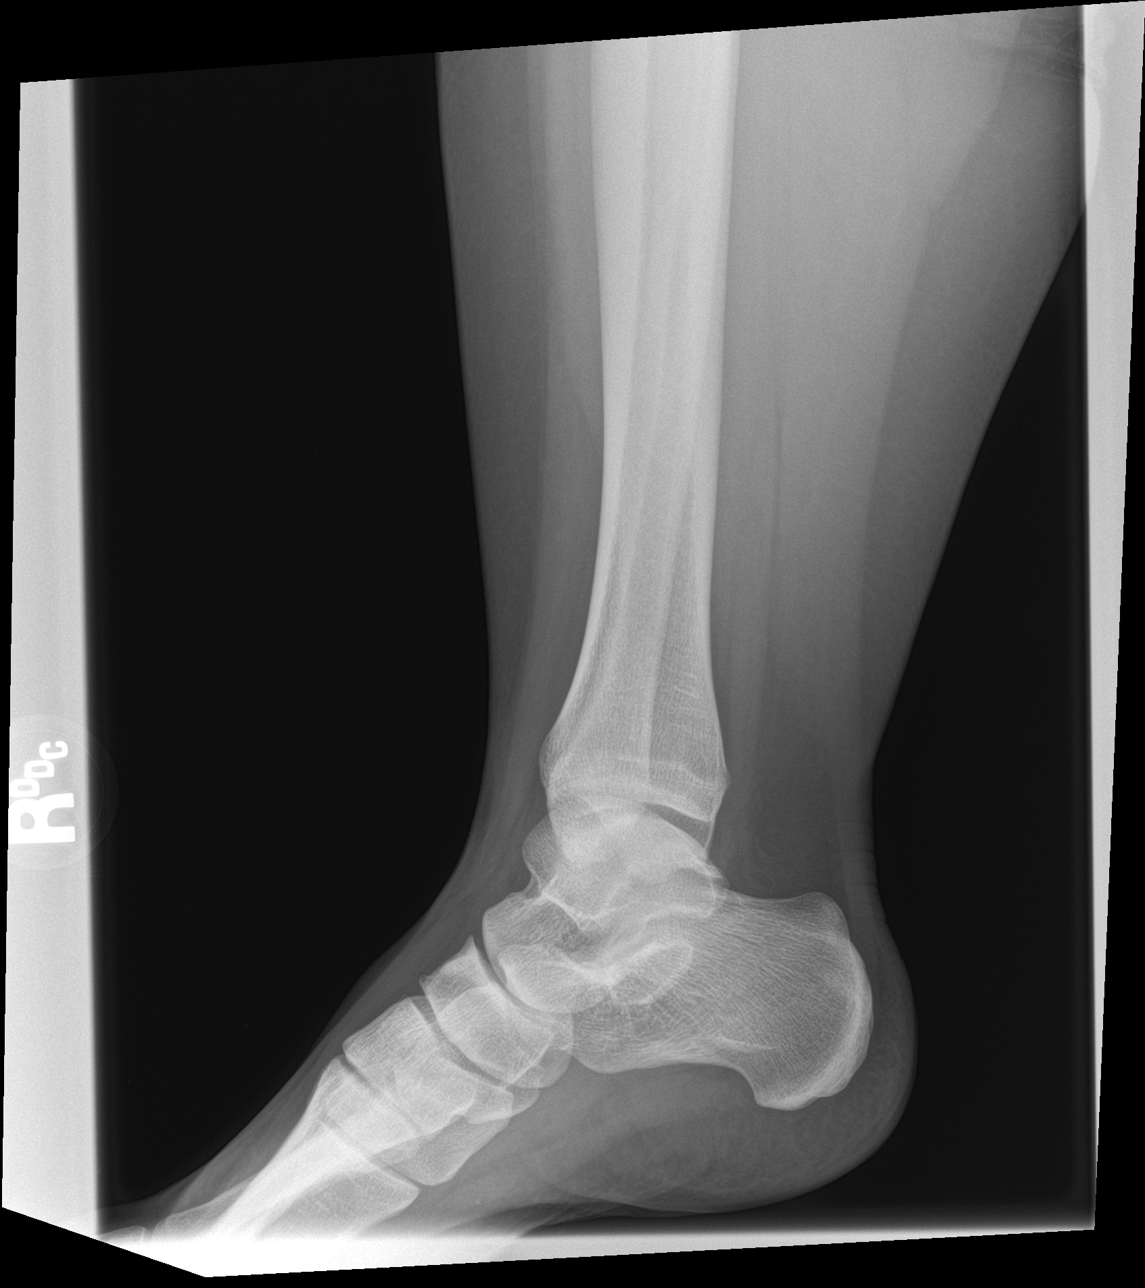

[3 of 3 positions shown; findings below may reference images not displayed]

FINDINGS: There is no evidence of fracture, dislocation, or joint effusion.
There is no evidence of arthropathy or other focal bone abnormality.
Soft tissues are unremarkable.
IMPRESSION: Negative.

## 2019-02-10 ENCOUNTER — Telehealth: Payer: Self-pay

## 2019-02-10 ENCOUNTER — Ambulatory Visit: Payer: Self-pay | Admitting: Licensed Clinical Social Worker

## 2019-02-10 NOTE — Chronic Care Management (AMB) (Signed)
  Care Management   Follow Up Note   02/10/2019 Name: Ashley Strickland MRN: [id number] DOB: 1995/12/19  Referred by: System, Pcp Not In Reason for referral : Care Coordination   Ashley Strickland is a 23 y.o. year old female who is a primary care patient of System, Pcp Not In. The care management team was consulted for assistance with care management and care coordination needs.    Review of patient status, including review of consultants reports, relevant laboratory and other test results, and collaboration with appropriate care team members and the patient's provider was performed as part of comprehensive patient evaluation and provision of chronic care management services.    LCSW completed CCM outreach attempt today but was unable to reach patient successfully. A HIPPA compliant voice message was left encouraging patient to return call once available. LCSW rescheduled CCM SW appointment as well.  A HIPPA compliant phone message was left for the patient providing contact information and requesting a return call.   Eula Fried, BSW, MSW, [email or web address] Phone: [phone]

## 2019-02-16 ENCOUNTER — Ambulatory Visit: Payer: BC Managed Care – PPO

## 2019-02-16 ENCOUNTER — Ambulatory Visit (INDEPENDENT_AMBULATORY_CARE_PROVIDER_SITE_OTHER): Payer: BC Managed Care – PPO | Admitting: Professional

## 2019-02-16 DIAGNOSIS — F331 Major depressive disorder, recurrent, moderate: Secondary | ICD-10-CM | POA: Diagnosis not present

## 2019-02-17 ENCOUNTER — Other Ambulatory Visit: Payer: Self-pay

## 2019-02-17 ENCOUNTER — Ambulatory Visit (INDEPENDENT_AMBULATORY_CARE_PROVIDER_SITE_OTHER): Payer: BC Managed Care – PPO | Admitting: Obstetrics and Gynecology

## 2019-02-17 VITALS — BP 136/81 | HR 108 | Ht 61.0 in | Wt 241.8 lb

## 2019-02-17 DIAGNOSIS — Z3042 Encounter for surveillance of injectable contraceptive: Secondary | ICD-10-CM | POA: Diagnosis not present

## 2019-02-17 MED ORDER — MEDROXYPROGESTERONE ACETATE 150 MG/ML IM SUSP
150.0000 mg | Freq: Once | INTRAMUSCULAR | Status: AC
  Administered 2019-02-17: 11:00:00 150 mg via INTRAMUSCULAR

## 2019-02-17 NOTE — Progress Notes (Signed)
Date last pap:N/A . Last Depo-Provera: 11/30/18. Side Effects if any: none Serum HCG indicated? Not done, patient is within dates. Depo-Provera 150 mg IM given [id number] Mattheus Rauls,LPN. Next appointment due Nov 5-May 19, 2019.   BP 136/81   Pulse (!) 108   Ht 5\' 1"  (1.549 m)   Wt 241 lb 12.8 oz (109.7 kg)   BMI 45.69 kg/m

## 2019-03-02 ENCOUNTER — Ambulatory Visit (INDEPENDENT_AMBULATORY_CARE_PROVIDER_SITE_OTHER): Payer: BC Managed Care – PPO | Admitting: Professional

## 2019-03-02 DIAGNOSIS — F331 Major depressive disorder, recurrent, moderate: Secondary | ICD-10-CM

## 2019-03-09 ENCOUNTER — Ambulatory Visit (INDEPENDENT_AMBULATORY_CARE_PROVIDER_SITE_OTHER): Payer: BC Managed Care – PPO | Admitting: Professional

## 2019-03-09 DIAGNOSIS — F331 Major depressive disorder, recurrent, moderate: Secondary | ICD-10-CM | POA: Diagnosis not present

## 2019-03-14 ENCOUNTER — Ambulatory Visit: Payer: BC Managed Care – PPO | Admitting: Professional

## 2019-03-17 ENCOUNTER — Ambulatory Visit: Payer: Self-pay | Admitting: Licensed Clinical Social Worker

## 2019-03-17 ENCOUNTER — Other Ambulatory Visit: Payer: Self-pay

## 2019-03-17 DIAGNOSIS — F329 Major depressive disorder, single episode, unspecified: Secondary | ICD-10-CM

## 2019-03-17 DIAGNOSIS — F32A Depression, unspecified: Secondary | ICD-10-CM

## 2019-03-17 NOTE — Chronic Care Management (AMB) (Signed)
Visit Information  Ms. Kho was given information about Care Management services today including:  1. Care Management services include personalized support from designated clinical staff supervised by her physician, including individualized plan of care and coordination with other care providers 2. 24/7 contact phone numbers for assistance for urgent and routine care needs. 3. The patient may stop CCM services at any time (effective at the end of the month) by phone call to the office staff.  Patient DID NOT AGREE to services today stating that she has found a therapist already and no longer needs CCM Services.   The care management team is available to follow up with the patient after provider conversation with the patient regarding recommendation for care management engagement and subsequent re-referral to the care management team.   Eula Fried, BSW, MSW, [email or web address] Phone: [phone]

## 2019-03-21 ENCOUNTER — Ambulatory Visit (INDEPENDENT_AMBULATORY_CARE_PROVIDER_SITE_OTHER): Payer: BC Managed Care – PPO | Admitting: Professional

## 2019-03-21 DIAGNOSIS — F411 Generalized anxiety disorder: Secondary | ICD-10-CM | POA: Diagnosis not present

## 2019-03-21 DIAGNOSIS — F331 Major depressive disorder, recurrent, moderate: Secondary | ICD-10-CM | POA: Diagnosis not present

## 2019-03-24 ENCOUNTER — Ambulatory Visit (INDEPENDENT_AMBULATORY_CARE_PROVIDER_SITE_OTHER): Payer: BC Managed Care – PPO | Admitting: Internal Medicine

## 2019-03-24 ENCOUNTER — Encounter: Payer: Self-pay | Admitting: Internal Medicine

## 2019-03-24 ENCOUNTER — Other Ambulatory Visit: Payer: Self-pay

## 2019-03-24 VITALS — BP 126/80 | HR 124 | Ht 61.0 in | Wt 243.0 lb

## 2019-03-24 DIAGNOSIS — R000 Tachycardia, unspecified: Secondary | ICD-10-CM

## 2019-03-24 DIAGNOSIS — G90A Postural orthostatic tachycardia syndrome (POTS): Secondary | ICD-10-CM

## 2019-03-24 DIAGNOSIS — I951 Orthostatic hypotension: Secondary | ICD-10-CM | POA: Diagnosis not present

## 2019-03-24 NOTE — Patient Instructions (Signed)
Medication Instructions:  - Your physician recommends that you continue on your current medications as directed. Please refer to the Current Medication list given to you today.  If you need a refill on your cardiac medications before your next appointment, please call your pharmacy.   Lab work: - Your physician recommends that you have lab work today: TSH/ CBC  If you have labs (blood work) drawn today and your tests are completely normal, you will receive your results only by: Marland Kitchen MyChart Message (if you have MyChart) OR . A paper copy in the mail If you have any lab test that is abnormal or we need to change your treatment, we will call you to review the results.  Testing/Procedures: - Your physician has requested that you have an echocardiogram. Echocardiography is a painless test that uses sound waves to create images of your heart. It provides your doctor with information about the size and shape of your heart and how well your heart's chambers and valves are working. This procedure takes approximately one hour. There are no restrictions for this procedure.  Follow-Up: At [facility], you and your health needs are our priority.  As part of our continuing mission to provide you with exceptional heart care, we have created designated Provider Care Teams.  These Care Teams include your primary Cardiologist (physician) and Advanced Practice Providers (APPs -  Physician Assistants and Nurse Practitioners) who all work together to provide you with the care you need, when you need it. . in 8-10 weeks with Dr. Graciela Husbands (E-visit)  Any Other Special Instructions Will Be Listed Below (If Applicable). - N/A   Echocardiogram An echocardiogram is a procedure that uses painless sound waves (ultrasound) to produce an image of the heart. Images from an echocardiogram can provide important information about:  Signs of coronary artery disease (CAD).  Aneurysm detection. An aneurysm is a weak or damaged  part of an artery wall that bulges out from the normal force of blood pumping through the body.  Heart size and shape. Changes in the size or shape of the heart can be associated with certain conditions, including heart failure, aneurysm, and CAD.  Heart muscle function.  Heart valve function.  Signs of a past heart attack.  Fluid buildup around the heart.  Thickening of the heart muscle.  A tumor or infectious growth around the heart valves. Tell a health care provider about:  Any allergies you have.  All medicines you are taking, including vitamins, herbs, eye drops, creams, and over-the-counter medicines.  Any blood disorders you have.  Any surgeries you have had.  Any medical conditions you have.  Whether you are pregnant or may be pregnant. What are the risks? Generally, this is a safe procedure. However, problems may occur, including:  Allergic reaction to dye (contrast) that may be used during the procedure. What happens before the procedure? No specific preparation is needed. You may eat and drink normally. What happens during the procedure?   An IV tube may be inserted into one of your veins.  You may receive contrast through this tube. A contrast is an injection that improves the quality of the pictures from your heart.  A gel will be applied to your chest.  A wand-like tool (transducer) will be moved over your chest. The gel will help to transmit the sound waves from the transducer.  The sound waves will harmlessly bounce off of your heart to allow the heart images to be captured in real-time motion. The  images will be recorded on a computer. The procedure may vary among health care providers and hospitals. What happens after the procedure?  You may return to your normal, everyday life, including diet, activities, and medicines, unless your health care provider tells you not to do that. Summary  An echocardiogram is a procedure that uses painless sound  waves (ultrasound) to produce an image of the heart.  Images from an echocardiogram can provide important information about the size and shape of your heart, heart muscle function, heart valve function, and fluid buildup around your heart.  You do not need to do anything to prepare before this procedure. You may eat and drink normally.  After the echocardiogram is completed, you may return to your normal, everyday life, unless your health care provider tells you not to do that. This information is not intended to replace advice given to you by your health care provider. Make sure you discuss any questions you have with your health care provider. Document Released: 06/13/2000 Document Revised: 10/07/2018 Document Reviewed: 07/19/2016 Elsevier Patient Education  2020 [employer].

## 2019-03-24 NOTE — Progress Notes (Signed)
ELECTROPHYSIOLOGY OFFICE NOTE  Patient ID: Ashley Strickland, MRN: [id number], DOB/AGE: [birth year] 22 y.o. Admit date: (Not on file) Date of Consult: 03/24/2019  Primary Physician: System, Pcp Not In       Ashley Strickland is a 23 y.o. female who is being seen today for the evaluation of POTS    HPI Ashley Strickland is a 23 y.o. female who is now 6 months postpartum presents with complaints of tachypalpitations.  She gives a diagnosis of POTS.  When asked where this diagnosis came from, she says [facility].  She remembers not having been pleased with her visit with Dr. Meredeth Ide and that she went subsequently to [facility].  There are no records in care everywhere that confirms these visits or makes a diagnosis of POTS.  When seen previously by [facility], monitor reviewed PVCs and symptoms with sinus tachy and the issue raised re dysautonomia.  Eval suggested structurally normal heart encouraged to hydrate.  Holter recorder from 2017 done at [facility] were reviewed.  Max heart rate is described at 185; however, almost all of the peak heart rates are less than 120.  Mean heart rate was in the 90s.  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  She was having symptoms of her "POTS "during her pregnancy and she felt that they would get better after she delivered.  They have not.  Symptoms include shower intolerance, heat intolerance, exercise intolerance associated with tachypalpitations shortness of breath and some chest pain; she has post defecation/micturition lightheadedness and a few weeks ago had a syncopal episode following micturition.  These are all associated with a typical prodromal response.  Her fluid status is replete.  Her fall status is deplete.  She does not exercise.  She says her anxiety and depression are better.  Symptoms however have remained significantly worse than before her pregnancy.  No peripheral edema.  No  nocturnal dyspnea or orthopnea.     Past Medical History:  Diagnosis Date  . Anemia   . Anxiety   . Constipation   . Dermatitis   . Joint pain   . Orthostatic hypotension   . Personal history of other mental and behavioral disorders   . Pott's disease   . Vitamin D deficiency       Surgical History:  Past Surgical History:  Procedure Laterality Date  . ADENOIDECTOMY       Home Meds: Current Meds  Medication Sig  . medroxyPROGESTERone (DEPO-PROVERA) 150 MG/ML injection Inject 1 mL (150 mg total) into the muscle every 3 (three) months.    Allergies:  Allergies  Allergen Reactions  . Phenergan [Promethazine Hcl] Other (See Comments)    Seizure     Social History   Socioeconomic History  . Marital status: Single    Spouse name: Not on file  . Number of children: Not on file  . Years of education: Not on file  . Highest education level: Not on file  Occupational History  . Not on file  Social Needs  . Financial resource strain: Not on file  . Food insecurity    Worry: Not on file    Inability: Not on file  . Transportation needs    Medical: Not on file    Non-medical: Not on file  Tobacco Use  . Smoking status: Never Smoker  . Smokeless tobacco: Never Used  Substance and Sexual Activity  . Alcohol use: No  .  Drug use: No  . Sexual activity: Yes    Birth control/protection: Injection  Lifestyle  . Physical activity    Days per week: Not on file    Minutes per session: Not on file  . Stress: Not on file  Relationships  . Social Herbalist on phone: Not on file    Gets together: Not on file    Attends religious service: Not on file    Active member of club or organization: Not on file    Attends meetings of clubs or organizations: Not on file    Relationship status: Not on file  . Intimate partner violence    Fear of current or ex partner: Not on file    Emotionally abused: Not on file    Physically abused: Not on file    Forced sexual  activity: Not on file  Other Topics Concern  . Not on file  Social History Narrative  . Not on file     Family History  Problem Relation Age of Onset  . Arthritis Mother   . Diabetes Neg Hx   . CAD Neg Hx   . Cancer - Other Neg Hx      ROS:  Please see the history of present illness.     All other systems reviewed and negative.    Physical Exam: Blood pressure 126/80, pulse (!) 124, height 5\' 1"  (1.549 m), weight 243 lb (110.2 kg), SpO2 98 %, currently breastfeeding. General: Well developed, Morbidly obese d female in no acute distress. Head: Normocephalic, atraumatic, sclera non-icteric, no xanthomas, nares are without discharge. EENT: normal  Lymph Nodes:  none Neck: Negative for carotid bruits. JVD not elevated. Back:without scoliosis kyphosis Lungs: Clear bilaterally to auscultation without wheezes, rales, or rhonchi. Breathing is unlabored. Heart: RAPID BUT RRR with S1 S2. No murmur . No rubs, or gallops appreciated. Abdomen: Soft, non-tender, non-distended with normoactive bowel sounds. No hepatomegaly. No rebound/guarding. No obvious abdominal masses. Msk:  Strength and tone appear normal for age. Extremities: No clubbing or cyanosis. No edema.  Distal pedal pulses are 2+ and equal bilaterally. Skin: Warm and Dry Neuro: Alert and oriented X 3. CN III-XII intact Grossly normal sensory and motor function . Psych:  Responds to questions appropriately with a normal affect.      Labs: Cardiac Enzymes No results for input(s): CKTOTAL, CKMB, TROPONINI in the last 72 hours. CBC Lab Results  Component Value Date   WBC 14.0 (H) 09/13/2018   HGB 11.0 (L) 09/13/2018   HCT 34.9 (L) 09/13/2018   MCV 78.6 (L) 09/13/2018   PLT 325 09/13/2018   PROTIME: No results for input(s): LABPROT, INR in the last 72 hours. Chemistry No results for input(s): NA, K, CL, CO2, BUN, CREATININE, CALCIUM, PROT, BILITOT, ALKPHOS, ALT, AST, GLUCOSE in the last 168 hours.  Invalid input(s):  LABALBU Lipids No results found for: CHOL, HDL, LDLCALC, TRIG BNP No results found for: PROBNP Thyroid Function Tests: No results for input(s): TSH, T4TOTAL, T3FREE, THYROIDAB in the last 72 hours.  Invalid input(s): FREET3 Miscellaneous No results found for: DDIMER  Radiology/Studies:  No results found.  EKG: Sinus rhythm at 104 Interval 13/07/33 heart rate variation was noted with cycle length as fast as 480 ms.   Assessment and Plan:  Sinus tachycardia-inappropriate-presumed  Syncope presumably neurally mediated  PVCs-history  Postpartum-6 months  Anxiety/depression  Morbid obesity   The patient has tachycardia--presumably inappropriate.  We will check a CBC and a TSH.  Her last CBC postpartum was in the nines.  TSH 8/19 was normal.  She has symptoms consistent with dysautonomia.  She does not meet the criteria for POTS.  Nor do I find documentation to support that diagnosis.  I have told her that; however, I also mentioned that it is just the label and that her symptoms are consistent with dysautonomia.  We have reviewed the physiology of autonomic insufficiency and have encouraged her to rehydrate with liquid IV, tri-oral, or Pedialyte advance care.   I stressed the importance of exercise although I cannot imagine this will be a challenge.  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       Viviann SpareSteven Darrian Grzelak \\Backslash  7/

## 2019-03-25 LAB — CBC WITH DIFFERENTIAL/PLATELET
Basophils Absolute: 0.1 10*3/uL (ref 0.0–0.2)
Basos: 1 %
EOS (ABSOLUTE): 0.1 10*3/uL (ref 0.0–0.4)
Eos: 1 %
Hematocrit: 42.2 % (ref 34.0–46.6)
Hemoglobin: 13.9 g/dL (ref 11.1–15.9)
Immature Grans (Abs): 0 10*3/uL (ref 0.0–0.1)
Immature Granulocytes: 0 %
Lymphocytes Absolute: 2.6 10*3/uL (ref 0.7–3.1)
Lymphs: 25 %
MCH: 24.7 pg — ABNORMAL LOW (ref 26.6–33.0)
MCHC: 32.9 g/dL (ref 31.5–35.7)
MCV: 75 fL — ABNORMAL LOW (ref 79–97)
Monocytes Absolute: 0.5 10*3/uL (ref 0.1–0.9)
Monocytes: 5 %
Neutrophils Absolute: 7.2 10*3/uL — ABNORMAL HIGH (ref 1.4–7.0)
Neutrophils: 68 %
Platelets: 382 10*3/uL (ref 150–450)
RBC: 5.63 x10E6/uL — ABNORMAL HIGH (ref 3.77–5.28)
RDW: 13.9 % (ref 11.7–15.4)
WBC: 10.5 10*3/uL (ref 3.4–10.8)

## 2019-03-25 LAB — TSH: TSH: 1.24 u[IU]/mL (ref 0.450–4.500)

## 2019-03-29 ENCOUNTER — Telehealth: Payer: Self-pay | Admitting: Internal Medicine

## 2019-03-29 NOTE — Telephone Encounter (Signed)
I called and spoke with [facility] to see if a Ferritin Level could be added on to labs that were drawn on the patient last week.  Per [employer], they will process this for add on testing and will confirm by fax if this can be done or not.  Will await fax confirmation prior to calling the patient.

## 2019-03-29 NOTE — Telephone Encounter (Signed)
Ashley Sprang, MD  Emily Filbert, RN; Dollene Primrose, RN        Please Inform Patient   Labs are normal x microcytic>> should get checked ferritin for iron deficiency   Thanks

## 2019-03-29 NOTE — Addendum Note (Signed)
Addended by: Alba Destine on: 03/29/2019 07:58 AM   Modules accepted: Orders

## 2019-03-31 ENCOUNTER — Telehealth: Payer: Self-pay | Admitting: Internal Medicine

## 2019-03-31 NOTE — Telephone Encounter (Signed)
Notes recorded by Emily Filbert, RN on 03/29/2019 at 3:01 PM EDT  I called and spoke with [facility] to see if a Ferritin Level could be added on to labs that were drawn on the patient last week.    Per [employer], they will process this for add on testing and will confirm by fax if this can be done or not.    Will await fax confirmation prior to calling the patient.

## 2019-03-31 NOTE — Telephone Encounter (Signed)
Reviewed in Epic that a Ferritin level could not be added on the patient's previous lab draw as their was an insufficient quantity of the specimen to perform this.   I attempted to call the patient with her results.  No answer- I left a message to please call back.

## 2019-03-31 NOTE — Telephone Encounter (Signed)
Notes recorded by Deboraha Sprang, MD on 03/27/2019 at 2:38 PM EDT  Please Inform Patient   Labs are normal x microcytic>> should get checked ferritin for iron deficiency   Thanks

## 2019-04-01 LAB — SPECIMEN STATUS REPORT

## 2019-04-01 LAB — FERRITIN

## 2019-04-04 ENCOUNTER — Ambulatory Visit (INDEPENDENT_AMBULATORY_CARE_PROVIDER_SITE_OTHER): Payer: BC Managed Care – PPO | Admitting: Professional

## 2019-04-04 DIAGNOSIS — F411 Generalized anxiety disorder: Secondary | ICD-10-CM | POA: Diagnosis not present

## 2019-04-04 DIAGNOSIS — F331 Major depressive disorder, recurrent, moderate: Secondary | ICD-10-CM

## 2019-04-05 NOTE — Telephone Encounter (Signed)
Results faxed to [facility] at [phone]. Confirmation received.

## 2019-04-05 NOTE — Telephone Encounter (Signed)
Patient aware of her lab results and possible iron deficiency and states this is not new for her.  She would like her PCP to follow this with her as she thinks she is due for labs soon anyway.   She states she is seen at [facility], but unsure of whom she sees. I did call [place] and confirmed she is a patient there. Will fax results to [facility] [phone].

## 2019-04-18 ENCOUNTER — Ambulatory Visit (INDEPENDENT_AMBULATORY_CARE_PROVIDER_SITE_OTHER): Payer: BC Managed Care – PPO | Admitting: Professional

## 2019-04-18 DIAGNOSIS — F331 Major depressive disorder, recurrent, moderate: Secondary | ICD-10-CM | POA: Diagnosis not present

## 2019-04-18 DIAGNOSIS — F411 Generalized anxiety disorder: Secondary | ICD-10-CM | POA: Diagnosis not present

## 2019-04-21 ENCOUNTER — Ambulatory Visit (INDEPENDENT_AMBULATORY_CARE_PROVIDER_SITE_OTHER): Payer: BC Managed Care – PPO

## 2019-04-21 ENCOUNTER — Other Ambulatory Visit: Payer: Self-pay

## 2019-04-21 DIAGNOSIS — R000 Tachycardia, unspecified: Secondary | ICD-10-CM

## 2019-05-02 ENCOUNTER — Ambulatory Visit (INDEPENDENT_AMBULATORY_CARE_PROVIDER_SITE_OTHER): Payer: BC Managed Care – PPO | Admitting: Professional

## 2019-05-02 DIAGNOSIS — F411 Generalized anxiety disorder: Secondary | ICD-10-CM

## 2019-05-02 DIAGNOSIS — F331 Major depressive disorder, recurrent, moderate: Secondary | ICD-10-CM | POA: Diagnosis not present

## 2019-05-05 ENCOUNTER — Ambulatory Visit: Payer: BC Managed Care – PPO

## 2019-05-06 ENCOUNTER — Ambulatory Visit: Payer: BC Managed Care – PPO

## 2019-05-16 ENCOUNTER — Ambulatory Visit (INDEPENDENT_AMBULATORY_CARE_PROVIDER_SITE_OTHER): Payer: BC Managed Care – PPO | Admitting: Professional

## 2019-05-16 DIAGNOSIS — F331 Major depressive disorder, recurrent, moderate: Secondary | ICD-10-CM | POA: Diagnosis not present

## 2019-05-16 DIAGNOSIS — F411 Generalized anxiety disorder: Secondary | ICD-10-CM | POA: Diagnosis not present

## 2019-05-17 ENCOUNTER — Other Ambulatory Visit: Payer: Self-pay

## 2019-05-17 ENCOUNTER — Encounter: Payer: Self-pay | Admitting: Obstetrics and Gynecology

## 2019-05-17 ENCOUNTER — Ambulatory Visit (INDEPENDENT_AMBULATORY_CARE_PROVIDER_SITE_OTHER): Payer: BC Managed Care – PPO | Admitting: Obstetrics and Gynecology

## 2019-05-17 VITALS — Ht 61.0 in | Wt 240.0 lb

## 2019-05-17 DIAGNOSIS — Z30011 Encounter for initial prescription of contraceptive pills: Secondary | ICD-10-CM

## 2019-05-17 MED ORDER — NORETHINDRONE ACET-ETHINYL EST 1.5-30 MG-MCG PO TABS: 1.0000 | ORAL_TABLET | Freq: Every day | ORAL | 4 refills | Status: DC

## 2019-05-17 NOTE — Progress Notes (Signed)
Televisit-pt present for birth control change. Pt stated that she no longer wants to take the depo injection due to having a cycle for 1 month. Pt stated that she has been using condoms for birth control since stopping depo. Pt's last depo 02/17/19 next depo due dates Nov 5-19, 2020.

## 2019-05-17 NOTE — Progress Notes (Signed)
Virtual Visit via Telephone Note  I connected with Ashley Strickland on 05/17/19 at  12:20 EST by telephone and verified that I am speaking with the correct person using two identifiers.   I discussed the limitations, risks, security and privacy concerns of performing an evaluation and management service by telephone and the availability of in person appointments. I also discussed with the patient that there may be a patient responsible charge related to this service. The patient expressed understanding and agreed to proceed.   History of Present Illness:  Ashley Strickland is a 23 y.o. [id number] female who desires to discuss contraception. Patient has been on Depo Provera but now desires to change to something else. Reports that her last injection was in August.  Complains that in October she had a menstrual cycle that lasted an entire month, stopped November 1st. Reports last intercourse was several days ago, used condoms.   Observations/Objective: Height: 5\' 1"  (1.549 m).  Weight: 240 lb (108.9 kg)    Assessment and Plan: Contraception management - patient desires to change from Depo Provera to a different contraceptive option (thinks she wants to use OCPs). Patient reports using pills in the remote past but can't remember the name. Will prescribe Junel 30 mcg. Discussed option of waiting to next cycle and do Sunday start, or can start now and use back up method and may have an episode of intermittent bleeding. Patient notes understanding, would like to start now.   Follow Up Instructions:    I discussed the assessment and treatment plan with the patient. The patient was provided an opportunity to ask questions and all were answered. The patient agreed with the plan and demonstrated an understanding of the instructions.   The patient was advised to call back or seek an in-person evaluation if the symptoms worsen or if the condition fails to improve as anticipated.  I provided 8 minutes of  non-face-to-face time during this encounter.    Rubie Maid, MD [facility]

## 2019-05-19 ENCOUNTER — Telehealth (INDEPENDENT_AMBULATORY_CARE_PROVIDER_SITE_OTHER): Payer: BC Managed Care – PPO | Admitting: Internal Medicine

## 2019-05-19 ENCOUNTER — Other Ambulatory Visit: Payer: Self-pay

## 2019-05-19 VITALS — Ht 61.0 in | Wt 240.0 lb

## 2019-05-19 DIAGNOSIS — R000 Tachycardia, unspecified: Secondary | ICD-10-CM

## 2019-05-19 DIAGNOSIS — I498 Other specified cardiac arrhythmias: Secondary | ICD-10-CM

## 2019-05-19 DIAGNOSIS — G90A Postural orthostatic tachycardia syndrome (POTS): Secondary | ICD-10-CM

## 2019-05-19 MED ORDER — METOPROLOL SUCCINATE ER 25 MG PO TB24: ORAL_TABLET | ORAL | 3 refills | Status: DC

## 2019-05-19 NOTE — Progress Notes (Signed)
Electrophysiology TeleHealth Note   Due to national recommendations of social distancing due to COVID 19, an audio/video telehealth visit is felt to be most appropriate for this patient at this time.  See MyChart message from today for the patient's consent to telehealth for [facility].   Date:  05/19/2019   ID:  [name], DOB 03-16-1996, MRN [id number]  Location: patient's home  Provider location: [address], [place]  Evaluation Performed: Follow-up visit  PCP:  System, Pcp Not In    Electrophysiologist:  SK   Chief Complaint:  syncope  History of Present Illness:    Ashley Strickland is a 23 y.o. female who presents via audio/video conferencing for a telehealth visit today.  Since last being seen in our clinic for sinus tachycardia, presumed inappropriate,  symptoms of dysautonomia for which she brought with her a diagnosis of POTS , this shortly after she gave birth to her son Lin Givens the patient reports intercurrent syncope--occurred while seated feeding baby.  Presyncope- walking, standing, really LH, vertigo, weak >> sit down with stereotypical recovery symptoms Showering in am-- HR--( watch broke)  Exercise is a challenge because of tachypalpitations and shortness of breath.  Vague intermittent chest pains.  Weight is up about 20 pounds since the delivery of her son, now at 24.    DATE TEST EF   10//20 Echo   55 %         Date Cr K TSH Hgb  3/20 0.51 3.8  9.3  9/20     1.24 13.9     The patient denies symptoms of fevers, chills, cough, or new SOB worrisome for COVID 19.    Past Medical History:  Diagnosis Date  . Anemia   . Anxiety   . Constipation   . Dermatitis   . Joint pain   . Orthostatic hypotension   . Personal history of other mental and behavioral disorders   . Pott's disease   . Vitamin D deficiency     Past Surgical History:  Procedure Laterality Date  . ADENOIDECTOMY      Current Outpatient  Medications  Medication Sig Dispense Refill  . ferrous sulfate 325 (65 FE) MG tablet Take 325 mg by mouth daily with breakfast.    . Norethindrone Acetate-Ethinyl Estradiol (JUNEL 1.5/30) 1.5-30 MG-MCG tablet Take 1 tablet by mouth daily. 3 Package 4   No current facility-administered medications for this visit.     Allergies:   Phenergan [promethazine hcl]   Social History:  The patient  reports that she has never smoked. She has never used smokeless tobacco. She reports current alcohol use. She reports that she does not use drugs.   Family History:  The patient's   family history includes Arthritis in her mother.   ROS:  Please see the history of present illness.   All other systems are personally reviewed and negative.    Exam:    Vital Signs:  Ht 5\' 1"  (1.549 m)   Wt 240 lb (108.9 kg)   BMI 45.35 kg/m     Labs/Other Tests and Data Reviewed:    Recent Labs: 09/13/2018: ALT 13; BUN 10; Creatinine, Ser 0.51; Potassium 3.8; Sodium 139 03/24/2019: Hemoglobin 13.9; Platelets 382; TSH 1.240   Wt Readings from Last 3 Encounters:  05/19/19 240 lb (108.9 kg)  05/17/19 240 lb (108.9 kg)  03/24/19 243 lb (110.2 kg)     Other studies personally reviewed: Additional studies/ records that were  reviewed today include: As above        ASSESSMENT & PLAN:    Dysatuonomia  Anxiety  Morbidly obese  Chest pain  Reviewed the physiology.  Stressed the importance of rehydration.  Have encouraged her to try either trioral, Pedialyte advance care, or liquid IV.  Encouraged her to shower at night and not in the morning and exercise.  Heart rates have been limiting.  We will begin beta-blockade.  In the past she took them and was benefited.  She recalls metoprolol.  She is bottlefeeding  Chest pain can occur in the context of her dysautonomia.  Almost certainly it is noncardiac.    COVID 19 screen The patient denies symptoms of COVID 19 at this time.  The importance of social  distancing was discussed today.  Follow-up: 6 weeks    Current medicines are reviewed at length with the patient today.   The patient does not have concerns regarding her medicines.  The following changes were made today:   Begin metorpolol succinate 25 mg qhs  .  Labs/ tests ordered today include:   No orders of the defined types were placed in this encounter.     Patient Risk:  after full review of this patients clinical status, I feel that they are at moderate risk at this time.  Today, I have spent 14 minutes with the patient with telehealth technology discussing the above.  Signed, Sherryl Manges, MD  05/19/2019 11:24 AM     [facility] [address] [place] [address] [phone] (office) [phone] (fax)

## 2019-05-19 NOTE — Patient Instructions (Addendum)
Medication Instructions:  - Your physician has recommended you make the following change in your medication:   1) Start toprol (metoprolol succinate) 25 mg- take 1 tablet by mouth once daily at bedtime  *If you need a refill on your cardiac medications before your next appointment, please call your pharmacy*  Lab Work: - none ordered If you have labs (blood work) drawn today and your tests are completely normal, you will receive your results only by: Marland Kitchen MyChart Message (if you have MyChart) OR . A paper copy in the mail If you have any lab test that is abnormal or we need to change your treatment, we will call you to review the results.  Testing/Procedures: - none ordered  Follow-Up: At [facility], you and your health needs are our priority.  As part of our continuing mission to provide you with exceptional heart care, we have created designated Provider Care Teams.  These Care Teams include your primary Cardiologist (physician) and Advanced Practice Providers (APPs -  Physician Assistants and Nurse Practitioners) who all work together to provide you with the care you need, when you need it.  Your next appointment:   6 week(s)  The format for your next appointment:   Virtual Visit   Provider:   Virl Axe, MD  Other Instructions -  shower at night and not in the morning  - exercise - rehydration: try either trioral, Pedialyte advance care, or liquid IV.

## 2019-05-20 ENCOUNTER — Telehealth: Payer: Self-pay | Admitting: Internal Medicine

## 2019-05-20 NOTE — Telephone Encounter (Signed)
Attempted to contact to schedule fu .    6 wk virtual per [facility]

## 2019-05-30 ENCOUNTER — Ambulatory Visit (INDEPENDENT_AMBULATORY_CARE_PROVIDER_SITE_OTHER): Payer: BC Managed Care – PPO | Admitting: Professional

## 2019-05-30 DIAGNOSIS — F331 Major depressive disorder, recurrent, moderate: Secondary | ICD-10-CM | POA: Diagnosis not present

## 2019-05-30 DIAGNOSIS — F411 Generalized anxiety disorder: Secondary | ICD-10-CM

## 2019-06-13 ENCOUNTER — Ambulatory Visit: Payer: BC Managed Care – PPO | Admitting: Professional

## 2019-06-14 ENCOUNTER — Encounter: Payer: BLUE CROSS/BLUE SHIELD | Admitting: Obstetrics and Gynecology

## 2019-06-27 ENCOUNTER — Ambulatory Visit (INDEPENDENT_AMBULATORY_CARE_PROVIDER_SITE_OTHER): Payer: BC Managed Care – PPO | Admitting: Professional

## 2019-06-27 DIAGNOSIS — F411 Generalized anxiety disorder: Secondary | ICD-10-CM

## 2019-06-27 DIAGNOSIS — F331 Major depressive disorder, recurrent, moderate: Secondary | ICD-10-CM | POA: Diagnosis not present

## 2019-07-11 ENCOUNTER — Ambulatory Visit (INDEPENDENT_AMBULATORY_CARE_PROVIDER_SITE_OTHER): Payer: BC Managed Care – PPO | Admitting: Professional

## 2019-07-11 DIAGNOSIS — F331 Major depressive disorder, recurrent, moderate: Secondary | ICD-10-CM | POA: Diagnosis not present

## 2019-07-11 DIAGNOSIS — F411 Generalized anxiety disorder: Secondary | ICD-10-CM | POA: Diagnosis not present

## 2019-07-19 ENCOUNTER — Encounter: Payer: BC Managed Care – PPO | Admitting: Obstetrics and Gynecology

## 2019-07-21 ENCOUNTER — Telehealth (INDEPENDENT_AMBULATORY_CARE_PROVIDER_SITE_OTHER): Payer: BC Managed Care – PPO | Admitting: Internal Medicine

## 2019-07-21 ENCOUNTER — Encounter: Payer: Self-pay | Admitting: Internal Medicine

## 2019-07-21 ENCOUNTER — Other Ambulatory Visit: Payer: Self-pay

## 2019-07-21 VITALS — Ht 61.0 in | Wt 240.0 lb

## 2019-07-21 DIAGNOSIS — G901 Familial dysautonomia [Riley-Day]: Secondary | ICD-10-CM | POA: Diagnosis not present

## 2019-07-21 DIAGNOSIS — R000 Tachycardia, unspecified: Secondary | ICD-10-CM

## 2019-07-21 NOTE — Progress Notes (Signed)
Electrophysiology TeleHealth Note   Due to national recommendations of social distancing due to COVID 19, an audio/video telehealth visit is felt to be most appropriate for this patient at this time.  See MyChart message from today for the patient's consent to telehealth for [facility].   Date:  07/21/2019   ID:  Ashley Strickland, DOB 01-21-1996, MRN [id number]  Location: patient's home  Provider location: [address], [place]  Evaluation Performed: Follow-up visit  PCP:  System, Pcp Not In    Electrophysiologist:  SK   Chief Complaint:  syncope  History of Present Illness:    Ashley Strickland is a 24 y.o. female (goes by Ashley Strickland) who presents via Engineer, civil (consulting) for a telehealth visit today.  Since last being seen in our clinic for sinus tachycardia, presumed inappropriate,  symptoms of dysautonomia for which she brought with her a diagnosis of POTS  the patient reports intercurrent improvement- less lightheadedness, interval syncope-- without warning--duration "sec" some residual lightheadedness   Drinking water, but not salt repletion ? Cost    Son born, 3/20, [place].   Recent ear infection now better  Weight stable not walking--not great neighborhood, some sick  DATE TEST EF   10//20 Echo   55 %         Date Cr K TSH Hgb  3/20 0.51 3.8  9.3  9/20     1.24 13.9     The patient denies symptoms of fevers, chills, cough, or new SOB worrisome for COVID 19.    Past Medical History:  Diagnosis Date  . Anemia   . Anxiety   . Constipation   . Dermatitis   . Joint pain   . Orthostatic hypotension   . Personal history of other mental and behavioral disorders   . Pott's disease   . Vitamin D deficiency     Past Surgical History:  Procedure Laterality Date  . ADENOIDECTOMY      Current Outpatient Medications  Medication Sig Dispense Refill  . metoprolol succinate (TOPROL-XL) 25 MG 24 hr tablet Take 1 tablet (25 mg) by mouth once daily  as bedtime 90 tablet 3  . ferrous sulfate 325 (65 FE) MG tablet Take 325 mg by mouth daily with breakfast.    . Norethindrone Acetate-Ethinyl Estradiol (JUNEL 1.5/30) 1.5-30 MG-MCG tablet Take 1 tablet by mouth daily. (Patient not taking: Reported on 07/21/2019) 3 Package 4   No current facility-administered medications for this visit.    Allergies:   Phenergan [promethazine hcl]   Social History:  The patient  reports that she has never smoked. She has never used smokeless tobacco. She reports current alcohol use. She reports that she does not use drugs.   Family History:  The patient's   family history includes Arthritis in her mother.   ROS:  Please see the history of present illness.   All other systems are personally reviewed and negative.    Exam:    Vital Signs:  Ht 5\' 1"  (1.549 m)   Wt 240 lb (108.9 kg) Comment: Estimated by patient-no home scale  BMI 45.35 kg/m     Labs/Other Tests and Data Reviewed:    Recent Labs: 09/13/2018: ALT 13; BUN 10; Creatinine, Ser 0.51; Potassium 3.8; Sodium 139 03/24/2019: Hemoglobin 13.9; Platelets 382; TSH 1.240   Wt Readings from Last 3 Encounters:  07/21/19 240 lb (108.9 kg)  05/19/19 240 lb (108.9 kg)  05/17/19 240 lb (108.9 kg)  Other studies personally reviewed: Additional studies/ records that were reviewed today include: As above        ASSESSMENT & PLAN:    Dysatuonomia  Anxiety  Morbidly obese  Chest pain  Recurrent syncope-- Lightheadedness--  We discussed extensively the issues of dysautonomia, the physiology of orthstasis and positional stress.  We discussed the role of salt and water repletion, the importance of exercise, often needing to be started in the recumbent position, and the awareness of triggers and the role of ambient heat and dehydration  Suggested thermatabs has more affordable salt supplement  Continue current dose of metoprolol; once she is sodium replete, we can go up if necessary.     COVID 19 screen The patient denies symptoms of COVID 19 at this time.  The importance of social distancing was discussed today.  Follow-up: 6 weeks    Current medicines are reviewed at length with the patient today.   The patient does not have concerns regarding her medicines.  The following changes were made today:   Begin ThermaTabs.  Available at [facility]  Labs/ tests ordered today include:   No orders of the defined types were placed in this encounter.     Patient Risk:  after full review of this patients clinical status, I feel that they are at moderate risk at this time.  Today, I have spent 13 minutes with the patient with telehealth technology discussing the above.  Signed, Sherryl Manges, MD  07/21/2019 10:33 AM     [facility] [address] [place] [address] [phone] (office) [phone] (fax)

## 2019-07-21 NOTE — Patient Instructions (Signed)
Medication Instructions:  1) Start over the counter Therma Tabs salt supplement- take 2 tablets by mouth twice a day  *If you need a refill on your cardiac medications before your next appointment, please call your pharmacy*  Lab Work: - none ordered If you have labs (blood work) drawn today and your tests are completely normal, you will receive your results only by: Marland Kitchen MyChart Message (if you have MyChart) OR . A paper copy in the mail If you have any lab test that is abnormal or we need to change your treatment, we will call you to review the results.  Testing/Procedures: - none ordered  Follow-Up: At [facility], you and your health needs are our priority.  As part of our continuing mission to provide you with exceptional heart care, we have created designated Provider Care Teams.  These Care Teams include your primary Cardiologist (physician) and Advanced Practice Providers (APPs -  Physician Assistants and Nurse Practitioners) who all work together to provide you with the care you need, when you need it.  Your next appointment:   6 week(s)  The format for your next appointment:   Either In Person or Virtual  Provider:   Sherryl Manges, MD  Other Instructions n/a

## 2019-07-22 ENCOUNTER — Ambulatory Visit (INDEPENDENT_AMBULATORY_CARE_PROVIDER_SITE_OTHER): Payer: BC Managed Care – PPO | Admitting: Professional

## 2019-07-22 DIAGNOSIS — F331 Major depressive disorder, recurrent, moderate: Secondary | ICD-10-CM

## 2019-07-22 DIAGNOSIS — F411 Generalized anxiety disorder: Secondary | ICD-10-CM | POA: Diagnosis not present

## 2019-07-25 ENCOUNTER — Ambulatory Visit: Payer: BC Managed Care – PPO | Admitting: Professional

## 2019-07-29 ENCOUNTER — Telehealth: Payer: Self-pay | Admitting: Internal Medicine

## 2019-07-29 NOTE — Telephone Encounter (Signed)
-----   Message from Jefferey Pica, RN sent at 07/29/2019  9:00 AM EST ----- The patient had a recent telehealth visit on 1/21 with Dr. Graciela Husbands, she was supposed to have a follow up in ~6 weeks either virtual or in person, her preference.  I didn't see this was scheduled yet.  Please call to arrange.  Thanks!

## 2019-07-29 NOTE — Telephone Encounter (Signed)
LVM to schedule 6 week fu

## 2019-08-01 ENCOUNTER — Ambulatory Visit (INDEPENDENT_AMBULATORY_CARE_PROVIDER_SITE_OTHER): Payer: BC Managed Care – PPO | Admitting: Professional

## 2019-08-01 DIAGNOSIS — F411 Generalized anxiety disorder: Secondary | ICD-10-CM | POA: Diagnosis not present

## 2019-08-01 DIAGNOSIS — F331 Major depressive disorder, recurrent, moderate: Secondary | ICD-10-CM | POA: Diagnosis not present

## 2019-08-04 ENCOUNTER — Other Ambulatory Visit: Payer: Self-pay

## 2019-08-04 ENCOUNTER — Encounter: Payer: BC Managed Care – PPO | Admitting: Obstetrics and Gynecology

## 2019-08-08 ENCOUNTER — Ambulatory Visit: Payer: BC Managed Care – PPO | Admitting: Professional

## 2019-08-17 ENCOUNTER — Encounter: Payer: Self-pay | Admitting: Obstetrics and Gynecology

## 2019-08-17 ENCOUNTER — Other Ambulatory Visit: Payer: Self-pay

## 2019-08-17 ENCOUNTER — Ambulatory Visit (INDEPENDENT_AMBULATORY_CARE_PROVIDER_SITE_OTHER): Payer: BC Managed Care – PPO | Admitting: Obstetrics and Gynecology

## 2019-08-17 VITALS — BP 106/74 | HR 80 | Ht 61.0 in | Wt 257.4 lb

## 2019-08-17 DIAGNOSIS — N921 Excessive and frequent menstruation with irregular cycle: Secondary | ICD-10-CM | POA: Diagnosis not present

## 2019-08-17 DIAGNOSIS — B9689 Other specified bacterial agents as the cause of diseases classified elsewhere: Secondary | ICD-10-CM | POA: Diagnosis not present

## 2019-08-17 DIAGNOSIS — N760 Acute vaginitis: Secondary | ICD-10-CM

## 2019-08-17 DIAGNOSIS — N941 Unspecified dyspareunia: Secondary | ICD-10-CM

## 2019-08-17 MED ORDER — MEDROXYPROGESTERONE ACETATE 10 MG PO TABS: 10.0000 mg | ORAL_TABLET | Freq: Every day | ORAL | 6 refills | Status: DC

## 2019-08-17 NOTE — Patient Instructions (Signed)
Endometriosis  Endometriosis is a condition in which the tissue that lines the uterus (endometrium) grows outside of its normal location. The tissue may grow in many locations close to the uterus, but it commonly grows on the ovaries, fallopian tubes, vagina, or bowel. When the uterus sheds the endometrium every menstrual cycle, there is bleeding wherever the endometrial tissue is located. This can cause pain because blood is irritating to tissues that are not normally exposed to it. What are the causes? The cause of endometriosis is not known. What increases the risk? You may be more likely to develop endometriosis if you:  Have a family history of endometriosis.  Have never given birth.  Started your period at age 10 or younger.  Have high levels of estrogen in your body.  Were exposed to a certain medicine (diethylstilbestrol) before you were born (in utero).  Had low birth weight.  Were born as a twin, triplet, or other multiple.  Have a BMI of less than 25. BMI is an estimate of body fat and is calculated from height and weight. What are the signs or symptoms? Often, there are no symptoms of this condition. If you do have symptoms, they may:  Vary depending on where your endometrial tissue is growing.  Occur during your menstrual period (most common) or midcycle.  Come and go, or you may go months with no symptoms at all.  Stop with menopause. Symptoms may include:  Pain in the back or abdomen.  Heavier bleeding during periods.  Pain during sex.  Painful bowel movements.  Infertility.  Pelvic pain.  Bleeding more than once a month. How is this diagnosed? This condition is diagnosed based on your symptoms and a physical exam. You may have tests, such as:  Blood tests and urine tests. These may be done to help rule out other possible causes of your symptoms.  Ultrasound, to look for abnormal tissues.  An X-ray of the lower bowel (barium enema).  An  ultrasound that is done through the vagina (transvaginally).  CT scan.  MRI.  Laparoscopy. In this procedure, a lighted, pencil-sized instrument called a laparoscope is inserted into your abdomen through an incision. The laparoscope allows your health care provider to look at the organs inside your body and check for abnormal tissue to confirm the diagnosis. If abnormal tissue is found, your health care provider may remove a small piece of tissue (biopsy) to be examined under a microscope. How is this treated? Treatment for this condition may include:  Medicines to relieve pain, such as NSAIDs.  Hormone therapy. This involves using artificial (synthetic) hormones to reduce endometrial tissue growth. Your health care provider may recommend using a hormonal form of birth control, or other medicines.  Surgery. This may be done to remove abnormal endometrial tissue. ? In some cases, tissue may be removed using a laparoscope and a laser (laparoscopic laser treatment). ? In severe cases, surgery may be done to remove the fallopian tubes, uterus, and ovaries (hysterectomy). Follow these instructions at home:  Take over-the-counter and prescription medicines only as told by your health care provider.  Do not drive or use heavy machinery while taking prescription pain medicine.  Try to avoid activities that cause pain, including sexual activity.  Keep all follow-up visits as told by your health care provider. This is important. Contact a health care provider if:  You have pain in the area between your hip bones (pelvic area) that occurs: ? Before, during, or after your period. ?   In between your period and gets worse during your period. ? During or after sex. ? With bowel movements or urination, especially during your period.  You have problems getting pregnant.  You have a fever. Get help right away if:  You have severe pain that does not get better with medicine.  You have severe  nausea and vomiting, or you cannot eat without vomiting.  You have pain that affects only the lower, right side of your abdomen.  You have abdominal pain that gets worse.  You have abdominal swelling.  You have blood in your stool. This information is not intended to replace advice given to you by your health care provider. Make sure you discuss any questions you have with your health care provider. Document Revised: 05/29/2017 Document Reviewed: 11/17/2015 Elsevier Patient Education  2020 [employer]  

## 2019-08-17 NOTE — Progress Notes (Signed)
Pt present due to having painful intercourse. Pt stated noticing pain during sex after the birth of her baby almost a year ago. Pt stated that the pain is not during each intercourse. Pt also stating noticing irregular cycle, heavy cycles with clots, and very painful pain. Pt stated pain during her cycles are really bad and she is unable to get out of bed during that time.

## 2019-08-17 NOTE — Progress Notes (Signed)
    GYNECOLOGY PROGRESS NOTE  Subjective:    Patient ID: Ashley Strickland, female    DOB: 14-Apr-1996, 24 y.o.   MRN: [id number]  HPI  Patient is a 24 y.o. G13P1001 female who presents for complaints of abnormally heavy cycles.  Notes sometimes they are so heavy and painful that she can't get out of bed.  States that she has always had fairly heavy cycles, but lately have been worsening. Usually has to change a pad every 2 hrs.  Cycles last 7-10 days. Heaviest days are days 4-7. Moderate dysmenorrhea associated (not taking anything for this). Have been irregular since her use of birth control after her last pregnancy (initially started on Depo Provera, then transitioned to OCPs). Stopped OCPs in January as she desires to conceive again. Does have a family history of PCOS (in mom) but denies family history of endometriosis.     She is also having pain with sex, is variable. Pain is sharp/shotting. Sometimes it's tolerable, and sometimes it feels "terrible". Does not vary based on position. The pain started after she had her last child last year.  Denies vaginal discharge.   The following portions of the patient's history were reviewed and updated as appropriate: allergies, current medications, past family history, past medical history, past social history, past surgical history and problem list.  Review of Systems Pertinent items noted in HPI and remainder of comprehensive ROS otherwise negative.   Objective:   Blood pressure 106/74, pulse 80, height 5\' 1"  (1.549 m), weight 257 lb 6.4 oz (116.8 kg), last menstrual period 08/09/2019, currently breastfeeding. General appearance: alert and no distress Abdomen: soft, non-tender; bowel sounds normal; no masses,  no organomegaly Pelvic: external genitalia normal, rectovaginal septum normal.  Vagina with small amount of thin white discharge.  Cervix normal appearing, no lesions and no motion tenderness.  Uterus mobile, non-tender, normal shape and size.   Moderate adnexal tenderness on right, mild adnexal tenderness on left. No adnexal masses bilaterally. Extremities: extremities normal, atraumatic, no cyanosis or edema Neurologic: Grossly normal   Microscopic wet-mount exam shows moderate clue cells, no hyphae, no trichomonads, few white blood cells. KOH done.    Assessment:   1. Dyspareunia in female   2. Menorrhagia with irregular cycle   3. Bacterial vaginosis    Plan:   1.  Dyspareunia-discussion had with patient that in light of her symptoms of pain with intercourse as well as symptoms of painful irregular menses it is possible that she may have symptoms of endometriosis.  Discussed typical treatments for endometriosis include hormonal suppression therapy, however as patient wants to conceive again soon, discussed other options for management.  Advised on methods of management for dyspareunia including finding comfortable sexual positions, using a pillow as a prop, penile rings, etc. Patient with significant pelvic pain (adnexal) on exam, will order pelvic ultrasound to rule out structural causes.  2.  Menorrhagia -discussed alternative methods of management for her heavy sometimes irregular cycles as patient does desire to conceive soon, including Lysteda, progesterone supplementation monthly.  3. Bacterial vaginosis - given sample of Nuvessa for treatment.  May also be a cause of pelvic pain.   Further workup and treatment could be done if symptoms persist, worsen or new related symptoms occur. She will f/u in 1-2 months to reassess symptoms.    10/07/2019, MD [facility]

## 2019-08-22 ENCOUNTER — Ambulatory Visit (INDEPENDENT_AMBULATORY_CARE_PROVIDER_SITE_OTHER): Payer: BC Managed Care – PPO | Admitting: Professional

## 2019-08-22 DIAGNOSIS — F411 Generalized anxiety disorder: Secondary | ICD-10-CM

## 2019-08-22 DIAGNOSIS — F331 Major depressive disorder, recurrent, moderate: Secondary | ICD-10-CM | POA: Diagnosis not present

## 2019-08-24 ENCOUNTER — Other Ambulatory Visit: Payer: Self-pay

## 2019-08-24 ENCOUNTER — Ambulatory Visit (INDEPENDENT_AMBULATORY_CARE_PROVIDER_SITE_OTHER): Payer: BC Managed Care – PPO

## 2019-08-24 ENCOUNTER — Other Ambulatory Visit: Payer: Self-pay | Admitting: Obstetrics and Gynecology

## 2019-08-24 DIAGNOSIS — R52 Pain, unspecified: Secondary | ICD-10-CM

## 2019-08-24 DIAGNOSIS — O99213 Obesity complicating pregnancy, third trimester: Secondary | ICD-10-CM

## 2019-08-29 ENCOUNTER — Ambulatory Visit: Payer: BC Managed Care – PPO | Admitting: Professional

## 2019-09-12 ENCOUNTER — Ambulatory Visit: Payer: BC Managed Care – PPO | Admitting: Professional

## 2019-09-16 ENCOUNTER — Ambulatory Visit: Payer: BC Managed Care – PPO | Admitting: Family Medicine

## 2019-09-19 ENCOUNTER — Ambulatory Visit: Payer: BC Managed Care – PPO | Admitting: Professional

## 2019-09-22 ENCOUNTER — Ambulatory Visit: Payer: BC Managed Care – PPO | Admitting: Internal Medicine

## 2019-09-23 ENCOUNTER — Encounter: Payer: BC Managed Care – PPO | Admitting: Obstetrics and Gynecology

## 2019-09-26 NOTE — Patient Instructions (Addendum)
Preventive Care 21-22 Years Old, Female Preventive care refers to visits with your health care provider and lifestyle choices that can promote health and wellness. This includes:  A yearly physical exam. This may also be called an annual well check.  Regular dental visits and eye exams.  Immunizations.  Screening for certain conditions.  Healthy lifestyle choices, such as eating a healthy diet, getting regular exercise, not using drugs or products that contain nicotine and tobacco, and limiting alcohol use. What can I expect for my preventive care visit? Physical exam Your health care provider will check your:  Height and weight. This may be used to calculate body mass index (BMI), which tells if you are at a healthy weight.  Heart rate and blood pressure.  Skin for abnormal spots. Counseling Your health care provider may ask you questions about your:  Alcohol, tobacco, and drug use.  Emotional well-being.  Home and relationship well-being.  Sexual activity.  Eating habits.  Work and work environment.  Method of birth control.  Menstrual cycle.  Pregnancy history. What immunizations do I need?  Influenza (flu) vaccine  This is recommended every year. Tetanus, diphtheria, and pertussis (Tdap) vaccine  You may need a Td booster every 10 years. Varicella (chickenpox) vaccine  You may need this if you have not been vaccinated. Human papillomavirus (HPV) vaccine  If recommended by your health care provider, you may need three doses over 6 months. Measles, mumps, and rubella (MMR) vaccine  You may need at least one dose of MMR. You may also need a second dose. Meningococcal conjugate (MenACWY) vaccine  One dose is recommended if you are age 19-21 years and a first-year college student living in a residence hall, or if you have one of several medical conditions. You may also need additional booster doses. Pneumococcal conjugate (PCV13) vaccine  You may need  this if you have certain conditions and were not previously vaccinated. Pneumococcal polysaccharide (PPSV23) vaccine  You may need one or two doses if you smoke cigarettes or if you have certain conditions. Hepatitis A vaccine  You may need this if you have certain conditions or if you travel or work in places where you may be exposed to hepatitis A. Hepatitis B vaccine  You may need this if you have certain conditions or if you travel or work in places where you may be exposed to hepatitis B. Haemophilus influenzae type b (Hib) vaccine  You may need this if you have certain conditions. You may receive vaccines as individual doses or as more than one vaccine together in one shot (combination vaccines). Talk with your health care provider about the risks and benefits of combination vaccines. What tests do I need?  Blood tests  Lipid and cholesterol levels. These may be checked every 5 years starting at age 20.  Hepatitis C test.  Hepatitis B test. Screening  Diabetes screening. This is done by checking your blood sugar (glucose) after you have not eaten for a while (fasting).  Sexually transmitted disease (STD) testing.  BRCA-related cancer screening. This may be done if you have a family history of breast, ovarian, tubal, or peritoneal cancers.  Pelvic exam and Pap test. This may be done every 3 years starting at age 21. Starting at age 30, this may be done every 5 years if you have a Pap test in combination with an HPV test. Talk with your health care provider about your test results, treatment options, and if necessary, the need for more tests.   Follow these instructions at home: Eating and drinking   Eat a diet that includes fresh fruits and vegetables, whole grains, lean protein, and low-fat dairy.  Take vitamin and mineral supplements as recommended by your health care provider.  Do not drink alcohol if: ? Your health care provider tells you not to drink. ? You are  pregnant, may be pregnant, or are planning to become pregnant.  If you drink alcohol: ? Limit how much you have to 0-1 drink a day. ? Be aware of how much alcohol is in your drink. In the [place] one drink equals one 12 oz bottle of beer (355 mL), one 5 oz glass of wine (148 mL), or one 1 oz glass of hard liquor (44 mL). Lifestyle  Take daily care of your teeth and gums.  Stay active. Exercise for at least 30 minutes on 5 or more days each week.  Do not use any products that contain nicotine or tobacco, such as cigarettes, e-cigarettes, and chewing tobacco. If you need help quitting, ask your health care provider.  If you are sexually active, practice safe sex. Use a condom or other form of birth control (contraception) in order to prevent pregnancy and STIs (sexually transmitted infections). If you plan to become pregnant, see your health care provider for a preconception visit. What's next?  Visit your health care provider once a year for a well check visit.  Ask your health care provider how often you should have your eyes and teeth checked.  Stay up to date on all vaccines. This information is not intended to replace advice given to you by your health care provider. Make sure you discuss any questions you have with your health care provider. Document Revised: 02/25/2018 Document Reviewed: 02/25/2018 Elsevier Patient Education  2020 [place] Breast self-awareness is knowing how your breasts look and feel. Doing breast self-awareness is important. It allows you to catch a breast problem early while it is still small and can be treated. All women should do breast self-awareness, including women who have had breast implants. Tell your doctor if you notice a change in your breasts. What you need:  A mirror.  A well-lit room. How to do a breast self-exam A breast self-exam is one way to learn what is normal for your breasts and to check for changes. To do a  breast self-exam: Look for changes  1. Take off all the clothes above your waist. 2. Stand in front of a mirror in a room with good lighting. 3. Put your hands on your hips. 4. Push your hands down. 5. Look at your breasts and nipples in the mirror to see if one breast or nipple looks different from the other. Check to see if: ? The shape of one breast is different. ? The size of one breast is different. ? There are wrinkles, dips, and bumps in one breast and not the other. 6. Look at each breast for changes in the skin, such as: ? Redness. ? Scaly areas. 7. Look for changes in your nipples, such as: ? Liquid around the nipples. ? Bleeding. ? Dimpling. ? Redness. ? A change in where the nipples are. Feel for changes  1. Lie on your back on the floor. 2. Feel each breast. To do this, follow these steps: ? Pick a breast to feel. ? Put the arm closest to that breast above your head. ? Use your other arm to feel the nipple area of your breast.  Feel the area with the pads of your three middle fingers by making small circles with your fingers. For the first circle, press lightly. For the second circle, press harder. For the third circle, press even harder. ? Keep making circles with your fingers at the different pressures as you move down your breast. Stop when you feel your ribs. ? Move your fingers a little toward the center of your body. ? Start making circles with your fingers again, this time going up until you reach your collarbone. ? Keep making up-and-down circles until you reach your armpit. Remember to keep using the three pressures. ? Feel the other breast in the same way. 3. Sit or stand in the tub or shower. 4. With soapy water on your skin, feel each breast the same way you did in step 2 when you were lying on the floor. Write down what you find Writing down what you find can help you remember what to tell your doctor. Write down:  What is normal for each breast.  Any  changes you find in each breast, including: ? The kind of changes you find. ? Whether you have pain. ? Size and location of any lumps.  When you last had your menstrual period. General tips  Check your breasts every month.  If you are breastfeeding, the best time to check your breasts is after you feed your baby or after you use a breast pump.  If you get menstrual periods, the best time to check your breasts is 5-7 days after your menstrual period is over.  With time, you will become comfortable with the self-exam, and you will begin to know if there are changes in your breasts. Contact a doctor if you:  See a change in the shape or size of your breasts or nipples.  See a change in the skin of your breast or nipples, such as red or scaly skin.  Have fluid coming from your nipples that is not normal.  Find a lump or thick area that was not there before.  Have pain in your breasts.  Have any concerns about your breast health. Summary  Breast self-awareness includes looking for changes in your breasts, as well as feeling for changes within your breasts.  Breast self-awareness should be done in front of a mirror in a well-lit room.  You should check your breasts every month. If you get menstrual periods, the best time to check your breasts is 5-7 days after your menstrual period is over.  Let your doctor know of any changes you see in your breasts, including changes in size, changes on the skin, pain or tenderness, or fluid from your nipples that is not normal. This information is not intended to replace advice given to you by your health care provider. Make sure you discuss any questions you have with your health care provider. Document Revised: 02/02/2018 Document Reviewed: 02/02/2018 Elsevier Patient Education  [place].    Dyspareunia, Female Dyspareunia is pain that is associated with sexual activity. This can affect any part of the genitals or lower abdomen.  There are many possible causes of this condition. In some cases, diagnosing the cause of dyspareunia can be difficult. This condition can be mild, moderate, or severe. Depending on the cause, dyspareunia may get better with treatment, but may return (recur) over time. What are the causes?  The cause of this condition is not always known. However, problems that affect the vulva, vagina, uterus, and other organs may  cause dyspareunia. Common causes of this condition include:  Vaginal dryness.  Giving birth.  Infection.  Skin changes or conditions.  Side effects of medicines.  Endometriosis. This is when tissue that is like the lining of the uterus grows on the outside of the uterus.  Psychological conditions. These include depression, anxiety, or traumatic experiences.  Allergic reaction. What increases the risk? The following factors may make you more likely to develop this condition:  History of physical or sexual trauma.  Some medicines.  No longer having a monthly period (menopause).  Having recently given birth.  Taking baths using soaps that have perfumes. These can cause irritation.  Douching. What are the signs or symptoms? The main symptom of this condition is pain in any part of your genitals or lower abdomen during or after sex. This may include:  Irritation, burning, or stinging sensations in your vulva.  Discomfort when your vulva or surrounding area is touched.  Aching and throbbing pain that may be constant.  Pain that gets worse when something is inserted into your vagina. How is this diagnosed? This condition may be diagnosed based on:  Your symptoms, including where and when your pain occurs.  Your medical history.  A physical exam. A pelvic exam will most likely be done.  Tests that include ultrasound, blood tests, and tests that check the body for infection.  Imaging tests, such as X-ray, MRI, and CT scan. You may be referred to a health  care provider who specializes in women's health (gynecologist). How is this treated? Treatment depends on the cause of your condition and your symptoms. In most cases, you may need to stop sexual activity until your symptoms go away or get better. Treatment may include:  Lubricants, ointments, and creams.  Physical therapy.  Massage therapy.  Hormonal therapy.  Medicines to: ? Prevent or fight infection. ? Relieve pain. ? Help numb the area. ? Treat depression (antidepressants).  Counseling, which may include sex therapy.  Surgery. Follow these instructions at home: Lifestyle  Wear cotton underwear.  Use water-based lubricants as needed during sex. Avoid oil-based lubricants.  Do not use any products that can cause irritation. This may include certain condoms, spermicides, lubricants, soaps, tampons, vaginal sprays, or douches.  Always practice safe sex. Use a condom to prevent sexually transmitted infections (STIs).  Talk freely with your partner about your condition. General instructions  Take or apply over-the-counter and prescription medicines only as told by your health care provider.  Urinate before you have sex.  Consider joining a support group.  Get the results of any tests you have done. Ask your health care provider, or the department that is doing the procedure, when your results will be ready.  Keep all follow-up visits as told by your health care provider. This is important. Contact a health care provider if:  You have vaginal bleeding after having sex.  You develop a lump at the opening of your vagina even if the lump is painless.  You have: ? Abnormal discharge from your vagina. ? Vaginal dryness. ? Itchiness or irritation of your vulva or vagina. ? A new rash. ? Symptoms that get worse or do not improve with treatment. ? A fever. ? Pain when you urinate. ? Blood in your urine. Get help right away if:  You have severe pain in your abdomen  during or shortly after sex.  You pass out after sex. Summary  Dyspareunia is pain that is associated with sexual activity. This can affect  any part of the genitals or lower abdomen.  There are many causes of this condition. Treatment depends on the cause and your symptoms. In most cases, you may need to stop sexual activity until your symptoms improve.  Take or apply over-the-counter and prescription medicines only as told by your health care provider.  Contact a health care provider if your symptoms get worse or do not improve with treatment.  Keep all follow-up visits as told by your health care provider. This is important. This information is not intended to replace advice given to you by your health care provider. Make sure you discuss any questions you have with your health care provider. Document Revised: 08/23/2018 Document Reviewed: 08/23/2018 Elsevier Patient Education  2020 [employer].     Preparing for Pregnancy If you are considering becoming pregnant, make an appointment to see your regular health care provider to learn how to prepare for a safe and healthy pregnancy (preconception care). During a preconception care visit, your health care provider will:  Do a complete physical exam, including a Pap test.  Take a complete medical history.  Give you information, answer your questions, and help you resolve problems. Preconception checklist Medical history  Tell your health care provider about any current or past medical conditions. Your pregnancy or your ability to become pregnant may be affected by chronic conditions, such as diabetes, chronic hypertension, and thyroid problems.  Include your family's medical history as well as your partner's medical history.  Tell your health care provider about any history of STIs (sexually transmitted infections).These can affect your pregnancy. In some cases, they can be passed to your baby. Discuss any concerns that you have  about STIs.  If indicated, discuss the benefits of genetic testing. This testing will show whether there are any genetic conditions that may be passed from you or your partner to your baby.  Tell your health care provider about: ? Any problems you have had with conception or pregnancy. ? Any medicines you take. These include vitamins, herbal supplements, and over-the-counter medicines. ? Your history of immunizations. Discuss any vaccinations that you may need. Diet  Ask your health care provider what to include in a healthy diet that has a balance of nutrients. This is especially important when you are pregnant or preparing to become pregnant.  Ask your health care provider to help you reach a healthy weight before pregnancy. ? If you are overweight, you may be at higher risk for certain complications, such as high blood pressure, diabetes, and preterm birth. ? If you are underweight, you are more likely to have a baby who has a low birth weight. Lifestyle, work, and home  Let your health care provider know: ? About any lifestyle habits that you have, such as alcohol use, drug use, or smoking. ? About recreational activities that may put you at risk during pregnancy, such as downhill skiing and certain exercise programs. ? Tell your health care provider about any international travel, especially any travel to places with an active [place] virus outbreak. ? About harmful substances that you may be exposed to at work or at home. These include chemicals, pesticides, radiation, or even litter boxes. ? If you do not feel safe at home. Mental health  Tell your health care provider about: ? Any history of mental health conditions, including feelings of depression, sadness, or anxiety. ? Any medicines that you take for a mental health condition. These include herbs and supplements. Home instructions to prepare for pregnancy  Lifestyle   Eat a balanced diet. This includes fresh fruits and  vegetables, whole grains, lean meats, low-fat dairy products, healthy fats, and foods that are high in fiber. Ask to meet with a nutritionist or registered dietitian for assistance with meal planning and goals.  Get regular exercise. Try to be active for at least 30 minutes a day on most days of the week. Ask your health care provider which activities are safe during pregnancy.  Do not use any products that contain nicotine or tobacco, such as cigarettes and e-cigarettes. If you need help quitting, ask your health care provider.  Do not drink alcohol.  Do not take illegal drugs.  Maintain a healthy weight. Ask your health care provider what weight range is right for you. General instructions  Keep an accurate record of your menstrual periods. This makes it easier for your health care provider to determine your baby's due date.  Begin taking prenatal vitamins and folic acid supplements daily as directed by your health care provider.  Manage any chronic conditions, such as high blood pressure and diabetes, as told by your health care provider. This is important. How do I know that I am pregnant? You may be pregnant if you have been sexually active and you miss your period. Symptoms of early pregnancy include:  Mild cramping.  Very light vaginal bleeding (spotting).  Feeling unusually tired.  Nausea and vomiting (morning sickness). If you have any of these symptoms and you suspect that you might be pregnant, you can take a home pregnancy test. These tests check for a hormone in your urine (human chorionic gonadotropin, or hCG). A woman's body begins to make this hormone during early pregnancy. These tests are very accurate. Wait until at least the first day after you miss your period to take one. If the test shows that you are pregnant (you get a positive result), call your health care provider to make an appointment for prenatal care. What should I do if I become pregnant?      Make  an appointment with your health care provider as soon as you suspect you are pregnant.  Do not use any products that contain nicotine, such as cigarettes, chewing tobacco, and e-cigarettes. If you need help quitting, ask your health care provider.  Do not drink alcoholic beverages. Alcohol is related to a number of birth defects.  Avoid toxic odors and chemicals.  You may continue to have sexual intercourse if it does not cause pain or other problems, such as vaginal bleeding. This information is not intended to replace advice given to you by your health care provider. Make sure you discuss any questions you have with your health care provider. Document Revised: 06/18/2017 Document Reviewed: 01/06/2016 Elsevier Patient Education  [place].

## 2019-09-26 NOTE — Progress Notes (Signed)
Pt present for annual exam. Pt is currently not on any form of birth control at this time. LMP 08/29/2019. Pt and husband is trying to conceive.  Pt stated that she is doing well no problems.

## 2019-09-27 ENCOUNTER — Other Ambulatory Visit: Payer: Self-pay

## 2019-09-27 ENCOUNTER — Ambulatory Visit (INDEPENDENT_AMBULATORY_CARE_PROVIDER_SITE_OTHER): Payer: BC Managed Care – PPO | Admitting: Obstetrics and Gynecology

## 2019-09-27 ENCOUNTER — Other Ambulatory Visit (HOSPITAL_COMMUNITY)
Admission: RE | Admit: 2019-09-27 | Discharge: 2019-09-27 | Disposition: A | Discharge status: Home / Self Care | Payer: BC Managed Care – PPO | Source: Ambulatory Visit | Attending: Obstetrics and Gynecology | Admitting: Obstetrics and Gynecology

## 2019-09-27 ENCOUNTER — Encounter: Payer: Self-pay | Admitting: Obstetrics and Gynecology

## 2019-09-27 VITALS — BP 125/87 | HR 86 | Ht 61.0 in | Wt 261.6 lb

## 2019-09-27 DIAGNOSIS — N941 Unspecified dyspareunia: Secondary | ICD-10-CM

## 2019-09-27 DIAGNOSIS — Z124 Encounter for screening for malignant neoplasm of cervix: Secondary | ICD-10-CM

## 2019-09-27 DIAGNOSIS — R102 Pelvic and perineal pain unspecified side: Secondary | ICD-10-CM

## 2019-09-27 DIAGNOSIS — Z6841 Body Mass Index (BMI) 40.0 and over, adult: Secondary | ICD-10-CM

## 2019-09-27 DIAGNOSIS — Z01419 Encounter for gynecological examination (general) (routine) without abnormal findings: Secondary | ICD-10-CM | POA: Diagnosis not present

## 2019-09-27 NOTE — Progress Notes (Signed)
GYNECOLOGY ANNUAL PHYSICAL EXAM PROGRESS NOTE  Subjective:    Ashley Strickland is a 24 y.o. G21P1001 female who presents for an annual exam. The patient is sexually active. The patient wears seatbelts: yes. The patient participates in regular exercise: no. Has the patient ever been transfused or tattooed?: no. The patient reports that there is not domestic violence in her life.    The patient has no complaints today:  1. [name] is Still noting pelvic pain, pain with intercourse and with cycles. She also notes that she is now trying to conceive. She was prescribed Provera last visit due to irregularity of menstrual cycle, notes that her cycle came on spontaneously this month.   Gynecologic History Patient's last menstrual period was 08/29/2019.   Menarche age: 45 or 70 Contraception: none. History of STI's: Denies Last Pap:  Patient has never had one.    OB History  Gravida Para Term Preterm AB Living  1 1 1  0 0 1  SAB TAB Ectopic Multiple Live Births  0 0 0 0 1    # Outcome Date GA Lbr Len/2nd Weight Sex Delivery Anes PTL Lv  1 Term 09/09/18 [redacted]w[redacted]d / 01:30 7 lb 2.6 oz (3.25 kg) M Vag-Spont EPI  LIV     Name: Beed,BOY Bridney     Apgar1: 8  Apgar5: 9    Past Medical History:  Diagnosis Date  . Anemia   . Anxiety   . Constipation   . Dermatitis   . Joint pain   . Orthostatic hypotension   . Personal history of other mental and behavioral disorders   . Pott's disease   . Vitamin D deficiency     Past Surgical History:  Procedure Laterality Date  . ADENOIDECTOMY      Family History  Problem Relation Age of Onset  . Arthritis Mother   . Polycystic ovary syndrome Mother   . Diabetes Neg Hx   . CAD Neg Hx   . Cancer - Other Neg Hx     Social History   Socioeconomic History  . Marital status: Single    Spouse name: Not on file  . Number of children: Not on file  . Years of education: Not on file  . Highest education level: Not on file  Occupational  History  . Not on file  Tobacco Use  . Smoking status: Never Smoker  . Smokeless tobacco: Never Used  Substance and Sexual Activity  . Alcohol use: Yes  . Drug use: No  . Sexual activity: Yes    Birth control/protection: None  Other Topics Concern  . Not on file  Social History Narrative  . Not on file   Social Determinants of Health   Financial Resource Strain:   . Difficulty of Paying Living Expenses:   Food Insecurity:   . Worried About [redacted]w[redacted]d in the Last Year:   . Programme researcher, broadcasting/film/video in the Last Year:   Transportation Needs:   . Barista (Medical):   Freight forwarder Lack of Transportation (Non-Medical):   Physical Activity:   . Days of Exercise per Week:   . Minutes of Exercise per Session:   Stress:   . Feeling of Stress :   Social Connections:   . Frequency of Communication with Friends and Family:   . Frequency of Social Gatherings with Friends and Family:   . Attends Religious Services:   . Active Member of Clubs or Organizations:   . Attends  Club or Organization Meetings:   Marland Kitchen Marital Status:   Intimate Partner Violence:   . Fear of Current or Ex-Partner:   . Emotionally Abused:   Marland Kitchen Physically Abused:   . Sexually Abused:     Current Outpatient Medications on File Prior to Visit  Medication Sig Dispense Refill  . metoprolol succinate (TOPROL-XL) 25 MG 24 hr tablet Take 1 tablet (25 mg) by mouth once daily as bedtime 90 tablet 3   No current facility-administered medications on file prior to visit.    Allergies  Allergen Reactions  . Phenergan [Promethazine Hcl] Other (See Comments)    Seizure       Review of Systems Constitutional: negative for chills, fatigue, fevers and sweats Eyes: negative for irritation, redness and visual disturbance Ears, nose, mouth, throat, and face: negative for hearing loss, nasal congestion, snoring and tinnitus Respiratory: negative for asthma, cough, sputum Cardiovascular: negative for chest pain,  dyspnea, exertional chest pressure/discomfort, irregular heart beat, palpitations and syncope Gastrointestinal: negative for abdominal pain, change in bowel habits, nausea and vomiting Genitourinary: negative for abnormal menstrual periods, genital lesions, sexual problems and vaginal discharge, dysuria and urinary incontinence Integument/breast: negative for breast lump, breast tenderness and nipple discharge Hematologic/lymphatic: negative for bleeding and easy bruising Musculoskeletal:negative for back pain and muscle weakness Neurological: negative for dizziness, headaches, vertigo and weakness Endocrine: negative for diabetic symptoms including polydipsia, polyuria and skin dryness Allergic/Immunologic: negative for hay fever and urticaria       Objective:  Blood pressure 125/87, pulse 86, height 5\' 1"  (1.549 m), weight 261 lb 9.6 oz (118.7 kg), last menstrual period 08/29/2019, currently breastfeeding. Body mass index is 49.43 kg/m.  General Appearance:    Alert, cooperative, no distress, appears stated age.   Head:    Normocephalic, without obvious abnormality, atraumatic  Eyes:    PERRL, conjunctiva/corneas clear, EOM's intact, both eyes  Ears:    Normal external ear canals, both ears  Nose:   Nares normal, septum midline, mucosa normal, no drainage or sinus tenderness  Throat:   Lips, mucosa, and tongue normal; teeth and gums normal  Neck:   Supple, symmetrical, trachea midline, no adenopathy; thyroid: no enlargement/tenderness/nodules; no carotid bruit or JVD  Back:     Symmetric, no curvature, ROM normal, no CVA tenderness  Lungs:     Clear to auscultation bilaterally, respirations unlabored  Chest Wall:    No tenderness or deformity   Heart:    Regular rate and rhythm, S1 and S2 normal, no murmur, rub or gallop  Breast Exam:    No tenderness, masses, or nipple abnormality  Abdomen:     Soft, non-tender, bowel sounds active all four quadrants, no masses, no organomegaly.      Genitalia:    Pelvic:external genitalia normal, vagina without lesions, discharge. Point tenderness at 6 and 9 o'clock just beyond hymenal ring. Rectovaginal septum  normal. Cervix normal in appearance, no cervical motion tenderness, no adnexal masses or tenderness.  Uterus normal size, shape, mobile, regular contours, nontender.  Rectal:    Normal external sphincter.  No hemorrhoids appreciated. Internal exam not done.   Extremities:   Extremities normal, atraumatic, no cyanosis or edema  Pulses:   2+ and symmetric all extremities  Skin:   Skin color, texture, turgor normal, no rashes or lesions  Lymph nodes:   Cervical, supraclavicular, and axillary nodes normal  Neurologic:   CNII-XII intact, normal strength, sensation and reflexes throughout   .  Labs:  Lab Results  Component  Value Date   WBC 10.5 03/24/2019   HGB 13.9 03/24/2019   HCT 42.2 03/24/2019   MCV 75 (L) 03/24/2019   PLT 382 03/24/2019    Lab Results  Component Value Date   CREATININE 0.51 09/13/2018   BUN 10 09/13/2018   NA 139 09/13/2018   K 3.8 09/13/2018   CL 110 09/13/2018   CO2 20 (L) 09/13/2018    Lab Results  Component Value Date   ALT 13 09/13/2018   AST 17 09/13/2018   ALKPHOS 109 09/13/2018   BILITOT 0.6 09/13/2018    Lab Results  Component Value Date   TSH 1.240 03/24/2019     Assessment:   1. Encounter for well woman exam with routine gynecological exam   2. Cervical cancer screening   3. Dyspareunia in female   4. Pelvic pain   5. Morbid obesity with BMI of 45.0-49.9, adult (HCC)     Plan:     Blood tests: None ordered. Plans to have labs drawn by PCP. Marland Kitchen Breast self exam technique reviewed and patient encouraged to perform self-exam monthly. Contraception: none. Recently started attempting to conceive. Advised on timed coitus and menstrual tracking.  Discussed healthy lifestyle modifications. Pap smear performed today with GC/Cl as per age based screening guidleines.  Referral  to physical therapy for pelvic pain and dyspareunia.  Patient with symptoms resembling endometriosis. Cannot utilize usual treatments as most involve hormonal manipulation and patient desires to conceive now.  Declines flu vaccine.  Follow up in 1 year, or sooner if conception occurs.    Hildred Laser, MD [facility]

## 2019-09-29 ENCOUNTER — Encounter: Payer: Self-pay | Admitting: Obstetrics and Gynecology

## 2019-09-29 LAB — CYTOLOGY - PAP
Chlamydia: NEGATIVE
Comment: NEGATIVE
Comment: NORMAL
Diagnosis: NEGATIVE
Neisseria Gonorrhea: NEGATIVE

## 2019-10-03 ENCOUNTER — Ambulatory Visit (INDEPENDENT_AMBULATORY_CARE_PROVIDER_SITE_OTHER): Payer: BC Managed Care – PPO | Admitting: Professional

## 2019-10-03 DIAGNOSIS — F411 Generalized anxiety disorder: Secondary | ICD-10-CM

## 2019-10-03 DIAGNOSIS — F331 Major depressive disorder, recurrent, moderate: Secondary | ICD-10-CM | POA: Diagnosis not present

## 2019-10-04 ENCOUNTER — Encounter: Payer: Self-pay | Admitting: Family Medicine

## 2019-10-04 ENCOUNTER — Other Ambulatory Visit: Payer: Self-pay

## 2019-10-04 ENCOUNTER — Ambulatory Visit (INDEPENDENT_AMBULATORY_CARE_PROVIDER_SITE_OTHER): Payer: BC Managed Care – PPO | Admitting: Family Medicine

## 2019-10-04 VITALS — BP 124/68 | HR 100 | Temp 98.1°F | Resp 14 | Ht 61.0 in | Wt 259.7 lb

## 2019-10-04 DIAGNOSIS — G901 Familial dysautonomia [Riley-Day]: Secondary | ICD-10-CM

## 2019-10-04 DIAGNOSIS — R000 Tachycardia, unspecified: Secondary | ICD-10-CM

## 2019-10-04 DIAGNOSIS — R634 Abnormal weight loss: Secondary | ICD-10-CM

## 2019-10-04 DIAGNOSIS — Z6841 Body Mass Index (BMI) 40.0 and over, adult: Secondary | ICD-10-CM

## 2019-10-04 DIAGNOSIS — I499 Cardiac arrhythmia, unspecified: Secondary | ICD-10-CM | POA: Diagnosis not present

## 2019-10-04 DIAGNOSIS — L309 Dermatitis, unspecified: Secondary | ICD-10-CM | POA: Diagnosis not present

## 2019-10-04 DIAGNOSIS — R079 Chest pain, unspecified: Secondary | ICD-10-CM | POA: Diagnosis not present

## 2019-10-04 DIAGNOSIS — R55 Syncope and collapse: Secondary | ICD-10-CM

## 2019-10-04 DIAGNOSIS — Z7689 Persons encountering health services in other specified circumstances: Secondary | ICD-10-CM

## 2019-10-04 DIAGNOSIS — G43909 Migraine, unspecified, not intractable, without status migrainosus: Secondary | ICD-10-CM

## 2019-10-04 DIAGNOSIS — I493 Ventricular premature depolarization: Secondary | ICD-10-CM

## 2019-10-04 MED ORDER — TRIAMCINOLONE ACETONIDE 0.1 % EX OINT: 1.0000 "application " | TOPICAL_OINTMENT | Freq: Two times a day (BID) | CUTANEOUS | 0 refills | Status: DC | PRN

## 2019-10-04 NOTE — Progress Notes (Signed)
Name: Ashley Strickland   MRN: [id number]    DOB: 03/21/1996   Date:10/04/2019       Progress Note  Chief Complaint  Patient presents with  . Establish Care  . Eczema    breakout on arms and legs  . POTS    has like seizure episodes with shaking, freezes can't speak or move its like she zones out     Subjective:   Ashley Strickland is a 24 y.o. female, presents to clinic for routine follow up on the conditions listed above.  Pt is new to establish care, she saw Dr. Bernita Buffy   Pt has a hx of possibly POTS dx - she has seen 2 cardiologist in the past and one thought she had POTS the other did not  The last year since baby was born march 2020 she is having shaking episodes, passing out episodes, she is having periods of feeling unable to do anything she feels like she is freezing but not loosing consciousness.  When episodes happen she feels CP described like "shes being sat on" with some mild difficulty breathing "heavier"   She is also having syncopal episodes - sometimes she will be standing or sitting - yesterday he was sitting on her bed playing with her toddler and she felt very faint and lightheaded and she has trouble speaking, sensation of vertigo, and then she fainted for maybe a minute, her head hurts and chest hurts, witnessed by husband yesterday lasted for a minute, she was disoriented immediately after syncopal episodes, she feels like she can't find words or speak normal - she can talk, but she doesn't make sense. A few months ago - syncope started- she woke up got out of bed, went to the bathroom, got back up and started to walk to her bed, but she didn't make it to the bed and she passed out and woke up on on the ground had hit her head on wood floor, cushioned by some laundry and pack n play.  No warning that time, husband wasn't home but he came home to be with her. She has been gaining weight and so she tried to start exercising but she is having worse sx with exertion,  CP  Arrhythmia she states - Saw [facility] [place] Dr. Caryl Comes did EKG and ECHO First saw cardiology 2016?  Per pt, per care every ER visit 10/04/2015  She consulted after with cardiology   10/17/2015 Initial consult [place] [facility]  [address] 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  Last cardiology visits 11/19, 1/21   Patient Active Problem List   Diagnosis Date Noted  . Anxiety and depression 09/23/2018  . POTS (postural orthostatic tachycardia syndrome) 02/26/2018  . Morbid obesity ([place]) 02/26/2018  . 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 ADENOIDECTOMY      Family History  Problem Relation Age of Onset  . Arthritis Mother   . Polycystic ovary syndrome Mother   . Arrhythmia Mother   . Anxiety disorder  Mother   . Depression Mother   . Hodgkin's lymphoma Father   . Depression Father   . Anxiety disorder Father   . Arrhythmia Brother   . COPD Maternal Grandfather   . Heart disease Maternal Grandfather   . Clotting disorder Maternal Grandfather   . Anxiety disorder Maternal Grandfather   . Depression Maternal Grandfather   . Diabetes Neg Hx   . CAD Neg Hx   . Cancer - Other Neg Hx     Social History   Tobacco Use  . Smoking status: Never Smoker  . Smokeless tobacco: Never Used  Substance Use Topics  . Alcohol use: Yes  . Drug use: No      Current Outpatient Medications:  .  medroxyPROGESTERone (PROVERA) 10 MG tablet, Take 10 mg by mouth daily., Disp: , Rfl:  .   ondansetron (ZOFRAN-ODT) 4 MG disintegrating tablet, , Disp: , Rfl:  .  pantoprazole (PROTONIX) 40 MG tablet, Take 40 mg by mouth daily., Disp: , Rfl:  .  metoprolol succinate (TOPROL-XL) 25 MG 24 hr tablet, Take 1 tablet (25 mg) by mouth once daily as bedtime (Patient not taking: Reported on 10/04/2019), Disp: 90 tablet, Rfl: 3  Allergies  Allergen Reactions  . Phenergan [Promethazine Hcl] Other (See Comments)    Seizure     Chart Review Today: I personally reviewed active problem list, medication list, allergies, family history, social history, health maintenance, notes from last encounter, lab results, imaging with the patient/caregiver today. Reviewed on day of encounter multiple visits with cardiology  Review of Systems  10 Systems reviewed and are negative for acute change except as noted in the HPI.  Objective:    Vitals:   10/04/19 1411  BP: 124/68  Pulse: 100  Resp: 14  Temp: 98.1 F (36.7 C)  SpO2: 98%  Weight: 259 lb 11.2 oz (117.8 kg)  Height: 5\' 1"  (1.549 m)    Body mass index is 49.07 kg/m.  Physical Exam Vitals and nursing note reviewed.  Constitutional:      General: She is not in acute distress.    Appearance: Normal appearance. She is well-developed. She is obese. She is not ill-appearing, toxic-appearing or diaphoretic.     Interventions: Face mask in place.  HENT:     Head: Normocephalic and atraumatic.     Right Ear: External ear normal.     Left Ear: External ear normal.  Eyes:     General: Lids are normal. No scleral icterus.       Right eye: No discharge.        Left eye: No discharge.     Conjunctiva/sclera: Conjunctivae normal.  Neck:     Trachea: Phonation normal. No tracheal deviation.  Cardiovascular:     Rate and Rhythm: Normal rate and regular rhythm.     Pulses: Normal pulses.          Radial pulses are 2+ on the right side and 2+ on the left side.       Posterior tibial pulses are 2+ on the right side and 2+ on the left side.      Heart sounds: Normal heart sounds. No murmur. No friction rub. No gallop.   Pulmonary:     Effort: Pulmonary effort is normal. No respiratory distress.     Breath sounds: Normal breath sounds. No stridor. No wheezing, rhonchi or rales.  Chest:     Chest wall: No tenderness.  Abdominal:     General: Bowel sounds are  normal. There is no distension.     Palpations: Abdomen is soft.     Tenderness: There is no abdominal tenderness. There is no guarding or rebound.  Musculoskeletal:        General: No deformity. Normal range of motion.     Cervical back: Normal range of motion and neck supple.     Right lower leg: No edema.     Left lower leg: No edema.  Lymphadenopathy:     Cervical: No cervical adenopathy.  Skin:    General: Skin is warm and dry.     Capillary Refill: Capillary refill takes less than 2 seconds.     Coloration: Skin is not jaundiced or pale.     Findings: No rash.  Neurological:     Mental Status: She is alert and oriented to person, place, and time.     Motor: No abnormal muscle tone.     Gait: Gait normal.  Psychiatric:        Speech: Speech normal.        Behavior: Behavior normal.     EKG:   See separate note and scan  PHQ2/9: Depression screen [facility] 2/9 10/04/2019 01/04/2019  Decreased Interest 1 2  Down, Depressed, Hopeless 1 2  PHQ - 2 Score 2 4  Altered sleeping 3 1  Tired, decreased energy 3 3  Change in appetite 2 3  Feeling bad or failure about yourself  0 3  Trouble concentrating 2 3  Moving slowly or fidgety/restless 2 3  Suicidal thoughts 0 0  PHQ-9 Score 14 20  Difficult doing work/chores Somewhat difficult Very difficult    phq 9 is positive - ruling out physical/pathophysiological etiology for sx  Fall Risk: Fall Risk  10/04/2019  Falls in the past year? 1  Number falls in past yr: 1  Injury with Fall? 0    Functional Status Survey: Is the patient deaf or have difficulty hearing?: No Does the patient have difficulty seeing, even when  wearing glasses/contacts?: Yes Does the patient have difficulty concentrating, remembering, or making decisions?: No Does the patient have difficulty walking or climbing stairs?: No Does the patient have difficulty dressing or bathing?: No Does the patient have difficulty doing errands alone such as visiting a doctor's office or shopping?: No   Assessment & Plan:   1. Syncope, unspecified syncope type 2 episodes, new sx, hx of possible POTS vs Dysautonomia, seizure hx? Remote related to med rxn? Has been seeing cardiology, encouraged to f/up for possible further work up Refer also to neuro for eval - CBC with Differential/Platelet - COMPLETE METABOLIC PANEL WITH GFR - TSH - Magnesium - Brain natriuretic peptide - EKG 12-Lead - Ambulatory referral to Neurology  2. Irregular heart rate Per cardiology, r/o anemia, hypo or hyperthyroid, electrolyte abnormality some exertional symptoms will check BNP but doubt any CHF patient appears euvolemic - CBC with Differential/Platelet - COMPLETE METABOLIC PANEL WITH GFR - TSH - Magnesium - Brain natriuretic peptide - EKG 12-Lead  3. Exertional chest pain We will treat with PPI for possible GI component currently do not suspect ACS the patient was encouraged to follow-up with cardiology, will check labs, EKG shows to signs of ischemia or infarct - CBC with Differential/Platelet - COMPLETE METABOLIC PANEL WITH GFR - TSH - Magnesium - Brain natriuretic peptide - EKG 12-Lead  4. Eczema, unspecified type reviewed management of eczema with hydration, allergy control, cortisone BID for mild sx, Rx steroid BID for severe rash, use less than  7d at a time - triamcinolone ointment (KENALOG) 0.1 %; Apply 1 application topically 2 (two) times daily as needed.  Dispense: 30 g; Refill: 0  5. Class 3 severe obesity with body mass index (BMI) of 45.0 to 49.9 in adult, unspecified obesity type, unspecified whether serious comorbidity present (HCC)   6.  Weight loss, unintentional We will see if she has hyperthyroid with other symptoms and unintentional weight loss? - CBC with Differential/Platelet - COMPLETE METABOLIC PANEL WITH GFR - TSH - Brain natriuretic peptide  7. PVC (premature ventricular contraction) Per cardiology - EKG 12-Lead  8. Dysautonomia (HCC) per Dr. Graciela Husbands - she will f/up with Dr. Graciela Husbands, would like neuro eval as well - EKG 12-Lead - Ambulatory referral to Neurology  9. Tachycardia per cardiology, encouraged her to f/up with cardiologist - EKG 12-Lead  10. Migraine without status migrainosus, not intractable, unspecified migraine type possible atypical migraines?  refer to neuro for further assessment of HA and multiple of sx/dx  11. Encounter to establish care with new doctor Reviewed available records, requesting from PCP  Follow-up with neurology and cardiology  Would like her to return here within the next few months to follow-up on PPI and to address other routine and preventative care   Danelle Berry, PA-C 10/04/19 2:38 PM

## 2019-10-05 LAB — CBC WITH DIFFERENTIAL/PLATELET
Absolute Monocytes: 532 cells/uL (ref 200–950)
Basophils Absolute: 38 cells/uL (ref 0–200)
Basophils Relative: 0.4 %
Eosinophils Absolute: 95 cells/uL (ref 15–500)
Eosinophils Relative: 1 %
HCT: 40.8 % (ref 35.0–45.0)
Hemoglobin: 13.2 g/dL (ref 11.7–15.5)
Lymphs Abs: 2518 cells/uL (ref 850–3900)
MCH: 26.5 pg — ABNORMAL LOW (ref 27.0–33.0)
MCHC: 32.4 g/dL (ref 32.0–36.0)
MCV: 81.8 fL (ref 80.0–100.0)
MPV: 10.6 fL (ref 7.5–12.5)
Monocytes Relative: 5.6 %
Neutro Abs: 6318 cells/uL (ref 1500–7800)
Neutrophils Relative %: 66.5 %
Platelets: 354 10*3/uL (ref 140–400)
RBC: 4.99 10*6/uL (ref 3.80–5.10)
RDW: 12.9 % (ref 11.0–15.0)
Total Lymphocyte: 26.5 %
WBC: 9.5 10*3/uL (ref 3.8–10.8)

## 2019-10-05 LAB — COMPLETE METABOLIC PANEL WITH GFR
AG Ratio: 1.5 (calc) (ref 1.0–2.5)
ALT: 10 U/L (ref 6–29)
AST: 13 U/L (ref 10–30)
Albumin: 3.8 g/dL (ref 3.6–5.1)
Alkaline phosphatase (APISO): 80 U/L (ref 31–125)
BUN: 11 mg/dL (ref 7–25)
CO2: 25 mmol/L (ref 20–32)
Calcium: 9.1 mg/dL (ref 8.6–10.2)
Chloride: 107 mmol/L (ref 98–110)
Creat: 0.68 mg/dL (ref 0.50–1.10)
GFR, Est African American: 145 mL/min/{1.73_m2} (ref >=60)
GFR, Est Non African American: 125 mL/min/{1.73_m2} (ref >=60)
Globulin: 2.6 g/dL (calc) (ref 1.9–3.7)
Glucose, Bld: 80 mg/dL (ref 65–99)
Potassium: 4.2 mmol/L (ref 3.5–5.3)
Sodium: 139 mmol/L (ref 135–146)
Total Bilirubin: 0.8 mg/dL (ref 0.2–1.2)
Total Protein: 6.4 g/dL (ref 6.1–8.1)

## 2019-10-05 LAB — MAGNESIUM: Magnesium: 2.1 mg/dL (ref 1.5–2.5)

## 2019-10-05 LAB — TSH: TSH: 1 mIU/L

## 2019-10-05 LAB — BRAIN NATRIURETIC PEPTIDE: Brain Natriuretic Peptide: 4 pg/mL (ref <100)

## 2019-10-06 ENCOUNTER — Ambulatory Visit: Payer: BC Managed Care – PPO | Admitting: Physical Therapy

## 2019-10-10 ENCOUNTER — Ambulatory Visit: Payer: BC Managed Care – PPO | Admitting: Physical Therapy

## 2019-10-10 ENCOUNTER — Ambulatory Visit: Payer: BC Managed Care – PPO | Admitting: Professional

## 2019-10-12 DIAGNOSIS — L309 Dermatitis, unspecified: Secondary | ICD-10-CM | POA: Insufficient documentation

## 2019-10-12 DIAGNOSIS — G901 Familial dysautonomia [Riley-Day]: Secondary | ICD-10-CM | POA: Insufficient documentation

## 2019-10-14 ENCOUNTER — Ambulatory Visit: Payer: BC Managed Care – PPO | Attending: Internal Medicine

## 2019-10-14 DIAGNOSIS — Z23 Encounter for immunization: Secondary | ICD-10-CM

## 2019-10-14 NOTE — Progress Notes (Signed)
   Covid-19 Vaccination Clinic  Name:  Athanasia Stanwood    MRN: [id number] DOB: 12/29/1995  10/14/2019  Ms. Panther was observed post Covid-19 immunization for 15 minutes without incident. She was provided with Vaccine Information Sheet and instruction to access the V-Safe system.   Ms. Leedy was instructed to call 911 with any severe reactions post vaccine: Marland Kitchen Difficulty breathing  . Swelling of face and throat  . A fast heartbeat  . A bad rash all over body  . Dizziness and weakness   Immunizations Administered    Name Date Dose VIS Date Route   Pfizer COVID-19 Vaccine 10/14/2019 11:30 AM 0.3 mL 06/10/2019 Intramuscular   Manufacturer: [employer], [employer]   Lot: [id number]   NDC: [address]-9

## 2019-10-18 ENCOUNTER — Ambulatory Visit (INDEPENDENT_AMBULATORY_CARE_PROVIDER_SITE_OTHER): Payer: BC Managed Care – PPO | Admitting: Professional

## 2019-10-18 ENCOUNTER — Telehealth (HOSPITAL_COMMUNITY): Payer: Self-pay | Admitting: Psychiatry

## 2019-10-18 DIAGNOSIS — F331 Major depressive disorder, recurrent, moderate: Secondary | ICD-10-CM | POA: Diagnosis not present

## 2019-10-18 DIAGNOSIS — F411 Generalized anxiety disorder: Secondary | ICD-10-CM | POA: Diagnosis not present

## 2019-10-18 NOTE — Telephone Encounter (Signed)
D:  Ashley Strickland, [facility] referred pt to MH-IOP.  A:  Placed call to orient patient and provide her with a start date.  Encouraged pt to verify her insurance benefits.  Pt will start MH-IOP next week (per pt's request) on 10-25-19.  Inform Olegario Messier.  R:  Pt receptive.

## 2019-10-24 ENCOUNTER — Ambulatory Visit: Payer: BC Managed Care – PPO | Admitting: Professional

## 2019-10-24 ENCOUNTER — Other Ambulatory Visit (HOSPITAL_COMMUNITY): Payer: BC Managed Care – PPO | Admitting: Psychiatry

## 2019-10-24 ENCOUNTER — Other Ambulatory Visit: Payer: Self-pay

## 2019-10-25 ENCOUNTER — Encounter: Payer: Self-pay | Admitting: Family Medicine

## 2019-10-31 ENCOUNTER — Ambulatory Visit: Payer: BC Managed Care – PPO | Admitting: Professional

## 2019-11-08 ENCOUNTER — Ambulatory Visit: Payer: Self-pay

## 2019-11-09 ENCOUNTER — Ambulatory Visit: Payer: BC Managed Care – PPO | Attending: Internal Medicine

## 2019-11-09 DIAGNOSIS — Z23 Encounter for immunization: Secondary | ICD-10-CM

## 2019-11-09 NOTE — Progress Notes (Signed)
   Covid-19 Vaccination Clinic  Name:  Ashley Strickland    MRN: [id number] DOB: [birth year]  11/09/2019  Ashley Strickland was observed post Covid-19 immunization for 15 minutes without incident. She was provided with Vaccine Information Sheet and instruction to access the V-Safe system.   Ashley Strickland was instructed to call 911 with any severe reactions post vaccine: Marland Kitchen Difficulty breathing  . Swelling of face and throat  . A fast heartbeat  . A bad rash all over body  . Dizziness and weakness   Immunizations Administered    Name Date Dose VIS Date Route   Pfizer COVID-19 Vaccine 11/09/2019 10:30 AM 0.3 mL 08/24/2018 Intramuscular   Manufacturer: [employer], [employer]   Lot: [id number]   NDC: [address]-8

## 2019-11-14 ENCOUNTER — Ambulatory Visit: Payer: BC Managed Care – PPO | Admitting: Professional

## 2019-11-21 ENCOUNTER — Ambulatory Visit: Payer: BC Managed Care – PPO | Admitting: Professional

## 2019-11-23 ENCOUNTER — Ambulatory Visit (INDEPENDENT_AMBULATORY_CARE_PROVIDER_SITE_OTHER): Payer: BC Managed Care – PPO | Admitting: Obstetrics and Gynecology

## 2019-11-23 ENCOUNTER — Encounter: Payer: Self-pay | Admitting: Obstetrics and Gynecology

## 2019-11-23 ENCOUNTER — Other Ambulatory Visit: Payer: Self-pay

## 2019-11-23 VITALS — BP 124/77 | HR 123 | Ht 61.0 in | Wt 258.4 lb

## 2019-11-23 DIAGNOSIS — O26899 Other specified pregnancy related conditions, unspecified trimester: Secondary | ICD-10-CM

## 2019-11-23 DIAGNOSIS — N926 Irregular menstruation, unspecified: Secondary | ICD-10-CM | POA: Diagnosis not present

## 2019-11-23 DIAGNOSIS — R102 Pelvic and perineal pain: Secondary | ICD-10-CM

## 2019-11-23 DIAGNOSIS — R110 Nausea: Secondary | ICD-10-CM

## 2019-11-23 DIAGNOSIS — K219 Gastro-esophageal reflux disease without esophagitis: Secondary | ICD-10-CM

## 2019-11-23 LAB — POCT URINE PREGNANCY: Preg Test, Ur: POSITIVE — AB

## 2019-11-23 MED ORDER — PANTOPRAZOLE SODIUM 40 MG PO TBEC: 40.0000 mg | DELAYED_RELEASE_TABLET | Freq: Every day | ORAL | 3 refills | Status: DC

## 2019-11-23 MED ORDER — ONDANSETRON 4 MG PO TBDP: 4.0000 mg | ORAL_TABLET | Freq: Three times a day (TID) | ORAL | 1 refills | Status: DC | PRN

## 2019-11-23 NOTE — Progress Notes (Signed)
PT is present today for confirmation of pregnancy. Pt LMP 10/21/2019. UPT done today results were positive. Pt c/o of cramping like a cycle, lightheaded, fatigue and acid reflux.

## 2019-11-23 NOTE — Patient Instructions (Signed)
First Trimester of Pregnancy The first trimester of pregnancy is from week 1 until the end of week 13 (months 1 through 3). A week after a sperm fertilizes an egg, the egg will implant on the wall of the uterus. This embryo will begin to develop into a baby. Genes from you and your partner will form the baby. The female genes will determine whether the baby will be a boy or a girl. At 6-8 weeks, the eyes and face will be formed, and the heartbeat can be seen on ultrasound. At the end of 12 weeks, all the baby's organs will be formed. Now that you are pregnant, you will want to do everything you can to have a healthy baby. Two of the most important things are to get good prenatal care and to follow your health care provider's instructions. Prenatal care is all the medical care you receive before the baby's birth. This care will help prevent, find, and treat any problems during the pregnancy and childbirth. Body changes during your first trimester Your body goes through many changes during pregnancy. The changes vary from woman to woman.  You may gain or lose a couple of pounds at first.  You may feel sick to your stomach (nauseous) and you may throw up (vomit). If the vomiting is uncontrollable, call your health care provider.  You may tire easily.  You may develop headaches that can be relieved by medicines. All medicines should be approved by your health care provider.  You may urinate more often. Painful urination may mean you have a bladder infection.  You may develop heartburn as a result of your pregnancy.  You may develop constipation because certain hormones are causing the muscles that push stool through your intestines to slow down.  You may develop hemorrhoids or swollen veins (varicose veins).  Your breasts may begin to grow larger and become tender. Your nipples may stick out more, and the tissue that surrounds them (areola) may become darker.  Your gums may bleed and may be  sensitive to brushing and flossing.  Dark spots or blotches (chloasma, mask of pregnancy) may develop on your face. This will likely fade after the baby is born.  Your menstrual periods will stop.  You may have a loss of appetite.  You may develop cravings for certain kinds of food.  You may have changes in your emotions from day to day, such as being excited to be pregnant or being concerned that something may go wrong with the pregnancy and baby.  You may have more vivid and strange dreams.  You may have changes in your hair. These can include thickening of your hair, rapid growth, and changes in texture. Some women also have hair loss during or after pregnancy, or hair that feels dry or thin. Your hair will most likely return to normal after your baby is born. What to expect at prenatal visits During a routine prenatal visit:  You will be weighed to make sure you and the baby are growing normally.  Your blood pressure will be taken.  Your abdomen will be measured to track your baby's growth.  The fetal heartbeat will be listened to between weeks 10 and 14 of your pregnancy.  Test results from any previous visits will be discussed. Your health care provider may ask you:  How you are feeling.  If you are feeling the baby move.  If you have had any abnormal symptoms, such as leaking fluid, bleeding, severe headaches, or abdominal   cramping.  If you are using any tobacco products, including cigarettes, chewing tobacco, and electronic cigarettes.  If you have any questions. Other tests that may be performed during your first trimester include:  Blood tests to find your blood type and to check for the presence of any previous infections. The tests will also be used to check for low iron levels (anemia) and protein on red blood cells (Rh antibodies). Depending on your risk factors, or if you previously had diabetes during pregnancy, you may have tests to check for high blood sugar  that affects pregnant women (gestational diabetes).  Urine tests to check for infections, diabetes, or protein in the urine.  An ultrasound to confirm the proper growth and development of the baby.  Fetal screens for spinal cord problems (spina bifida) and Down syndrome.  HIV (human immunodeficiency virus) testing. Routine prenatal testing includes screening for HIV, unless you choose not to have this test.  You may need other tests to make sure you and the baby are doing well. Follow these instructions at home: Medicines  Follow your health care provider's instructions regarding medicine use. Specific medicines may be either safe or unsafe to take during pregnancy.  Take a prenatal vitamin that contains at least 600 micrograms (mcg) of folic acid.  If you develop constipation, try taking a stool softener if your health care provider approves. Eating and drinking   Eat a balanced diet that includes fresh fruits and vegetables, whole grains, good sources of protein such as meat, eggs, or tofu, and low-fat dairy. Your health care provider will help you determine the amount of weight gain that is right for you.  Avoid raw meat and uncooked cheese. These carry germs that can cause birth defects in the baby.  Eating four or five small meals rather than three large meals a day may help relieve nausea and vomiting. If you start to feel nauseous, eating a few soda crackers can be helpful. Drinking liquids between meals, instead of during meals, also seems to help ease nausea and vomiting.  Limit foods that are high in fat and processed sugars, such as fried and sweet foods.  To prevent constipation: ? Eat foods that are high in fiber, such as fresh fruits and vegetables, whole grains, and beans. ? Drink enough fluid to keep your urine clear or pale yellow. Activity  Exercise only as directed by your health care provider. Most women can continue their usual exercise routine during  pregnancy. Try to exercise for 30 minutes at least 5 days a week. Exercising will help you: ? Control your weight. ? Stay in shape. ? Be prepared for labor and delivery.  Experiencing pain or cramping in the lower abdomen or lower back is a good sign that you should stop exercising. Check with your health care provider before continuing with normal exercises.  Try to avoid standing for long periods of time. Move your legs often if you must stand in one place for a long time.  Avoid heavy lifting.  Wear low-heeled shoes and practice good posture.  You may continue to have sex unless your health care provider tells you not to. Relieving pain and discomfort  Wear a good support bra to relieve breast tenderness.  Take warm sitz baths to soothe any pain or discomfort caused by hemorrhoids. Use hemorrhoid cream if your health care provider approves.  Rest with your legs elevated if you have leg cramps or low back pain.  If you develop varicose veins in   your legs, wear support hose. Elevate your feet for 15 minutes, 3-4 times a day. Limit salt in your diet. Prenatal care  Schedule your prenatal visits by the twelfth week of pregnancy. They are usually scheduled monthly at first, then more often in the last 2 months before delivery.  Write down your questions. Take them to your prenatal visits.  Keep all your prenatal visits as told by your health care provider. This is important. Safety  Wear your seat belt at all times when driving.  Make a list of emergency phone numbers, including numbers for family, friends, the hospital, and police and fire departments. General instructions  Ask your health care provider for a referral to a local prenatal education class. Begin classes no later than the beginning of month 6 of your pregnancy.  Ask for help if you have counseling or nutritional needs during pregnancy. Your health care provider can offer advice or refer you to specialists for help  with various needs.  Do not use hot tubs, steam rooms, or saunas.  Do not douche or use tampons or scented sanitary pads.  Do not cross your legs for long periods of time.  Avoid cat litter boxes and soil used by cats. These carry germs that can cause birth defects in the baby and possibly loss of the fetus by miscarriage or stillbirth.  Avoid all smoking, herbs, alcohol, and medicines not prescribed by your health care provider. Chemicals in these products affect the formation and growth of the baby.  Do not use any products that contain nicotine or tobacco, such as cigarettes and e-cigarettes. If you need help quitting, ask your health care provider. You may receive counseling support and other resources to help you quit.  Schedule a dentist appointment. At home, brush your teeth with a soft toothbrush and be gentle when you floss. Contact a health care provider if:  You have dizziness.  You have mild pelvic cramps, pelvic pressure, or nagging pain in the abdominal area.  You have persistent nausea, vomiting, or diarrhea.  You have a bad smelling vaginal discharge.  You have pain when you urinate.  You notice increased swelling in your face, hands, legs, or ankles.  You are exposed to fifth disease or chickenpox.  You are exposed to German measles (rubella) and have never had it. Get help right away if:  You have a fever.  You are leaking fluid from your vagina.  You have spotting or bleeding from your vagina.  You have severe abdominal cramping or pain.  You have rapid weight gain or loss.  You vomit blood or material that looks like coffee grounds.  You develop a severe headache.  You have shortness of breath.  You have any kind of trauma, such as from a fall or a car accident. Summary  The first trimester of pregnancy is from week 1 until the end of week 13 (months 1 through 3).  Your body goes through many changes during pregnancy. The changes vary from  woman to woman.  You will have routine prenatal visits. During those visits, your health care provider will examine you, discuss any test results you may have, and talk with you about how you are feeling. This information is not intended to replace advice given to you by your health care provider. Make sure you discuss any questions you have with your health care provider. Document Revised: 05/29/2017 Document Reviewed: 05/28/2016 Elsevier Patient Education  2020 [employer]  

## 2019-11-23 NOTE — Progress Notes (Signed)
   GYNECOLOGY CLINIC PROGRESS NOTE Subjective:    Ashley Strickland is a 24 y.o. G51P1001 female who presents for evaluation of amenorrhea. She believes she could be pregnant.  Pregnancy is desired and was planned. Has been trying to conceive since December. Sexual Activity: single partner, contraception: none. Current symptoms also include: positive home pregnancy test and reflux symptoms (partially relieved by Tums). Also noting lightheadedness.  Lastly notes pelvic cramping. Last period was normal. Patient's last menstrual period was 10/21/2019.    The following portions of the patient's history were reviewed and updated as appropriate: allergies, current medications, past family history, past medical history, past social history, past surgical history and problem list.  Review of Systems Pertinent items noted in HPI and remainder of comprehensive ROS otherwise negative.     Objective:    BP 124/77   Pulse (!) 123   Ht 5\' 1"  (1.549 m)   Wt 258 lb 6.4 oz (117.2 kg)   LMP 10/21/2019   BMI 48.82 kg/m .  Pulse on repeat was 100.   General: alert and no acute distress    Lab Review Urine HCG: positive    Assessment:   Absence of menstruation.  Nausea  Gastric reflux Pelvic cramping  Plan:   - Pregnancy Test: Positive: EDC: 07/27/2020, EGA 4.5 weeks. Briefly discussed pre-natal care options. Pregnancy, Childbirth and the Newborn book given. Encouraged well-balanced diet, plenty of rest when needed, pre-natal vitamins daily and walking for exercise. Discussed self-help for nausea, avoiding OTC medications until consulting provider or pharmacist, other than Tylenol as needed, minimal caffeine (1-2 cups daily) and avoiding alcohol. NOB intake scheduled in 3-4 weeks with viability ultrasound.  - Gastric reflux, patient with history outside of pregnancy. Was taking Protonix in the past. Currently not relieved with use of Tums. Will give new prescription and advised to resume Protonix.  -  Pelvic cramping, advised on use of Tylenol prn. To f/u if cramping worsens or associated with bleeding.    07/29/2020, MD [facility]

## 2019-12-05 ENCOUNTER — Telehealth: Payer: Self-pay | Admitting: Obstetrics and Gynecology

## 2019-12-05 ENCOUNTER — Ambulatory Visit: Payer: BC Managed Care – PPO | Admitting: Professional

## 2019-12-05 DIAGNOSIS — E559 Vitamin D deficiency, unspecified: Secondary | ICD-10-CM | POA: Insufficient documentation

## 2019-12-05 NOTE — Telephone Encounter (Signed)
Spoke with patient and she is having cramping and light vaginal bleeding. I explained that there is not a doctor in the office today and she would have to go to ED to be seen. Patient would rather wait until tomorrow. I have put her on Dr. Valentino Saxon schedule tomorrow. I advised patient that if pain and bleeding get worse to go to ED.

## 2019-12-05 NOTE — Telephone Encounter (Signed)
Pt called in and stated that she is [redacted] weeks pregnant and that she is bleeding not heavy, but she is having bad cramps and pelvic pain, and a headache. I called back to JW she was rooming a pt and told me to send telephone message to her.

## 2019-12-06 ENCOUNTER — Ambulatory Visit: Payer: BC Managed Care – PPO | Admitting: Family Medicine

## 2019-12-06 ENCOUNTER — Other Ambulatory Visit (INDEPENDENT_AMBULATORY_CARE_PROVIDER_SITE_OTHER): Payer: BC Managed Care – PPO

## 2019-12-06 ENCOUNTER — Encounter: Payer: Self-pay | Admitting: Obstetrics and Gynecology

## 2019-12-06 ENCOUNTER — Ambulatory Visit (INDEPENDENT_AMBULATORY_CARE_PROVIDER_SITE_OTHER): Payer: BC Managed Care – PPO | Admitting: Obstetrics and Gynecology

## 2019-12-06 ENCOUNTER — Other Ambulatory Visit: Payer: Self-pay

## 2019-12-06 VITALS — BP 109/80 | HR 111 | Ht 61.0 in | Wt 259.7 lb

## 2019-12-06 DIAGNOSIS — O209 Hemorrhage in early pregnancy, unspecified: Secondary | ICD-10-CM

## 2019-12-06 DIAGNOSIS — O26891 Other specified pregnancy related conditions, first trimester: Secondary | ICD-10-CM | POA: Diagnosis not present

## 2019-12-06 DIAGNOSIS — Z3A01 Less than 8 weeks gestation of pregnancy: Secondary | ICD-10-CM | POA: Diagnosis not present

## 2019-12-06 DIAGNOSIS — R102 Pelvic and perineal pain: Secondary | ICD-10-CM | POA: Diagnosis not present

## 2019-12-06 DIAGNOSIS — O200 Threatened abortion: Secondary | ICD-10-CM | POA: Diagnosis not present

## 2019-12-06 LAB — POCT URINALYSIS DIPSTICK OB
Bilirubin, UA: NEGATIVE
Glucose, UA: NEGATIVE
Ketones, UA: NEGATIVE
Nitrite, UA: NEGATIVE
POC,PROTEIN,UA: NEGATIVE
Spec Grav, UA: 1.02 (ref 1.010–1.025)
Urobilinogen, UA: 0.2 E.U./dL
pH, UA: 5 (ref 5.0–8.0)

## 2019-12-06 NOTE — Progress Notes (Signed)
    GYNECOLOGY PROGRESS NOTE  Subjective:    Patient ID: Ashley Strickland, female    DOB: [birth year], 24 y.o.   MRN: [id number]  HPI  Patient is a 24 y.o. G14P1001 female who presents for complaints of intense pelvic cramping and associated vaginal bleeding yesterday and this morning.  The pain was so intense that she was unable to get out of bed this morning. Bleeding has subsided since this morning. Denies passage of clots or tissue products.  Patient's last menstrual period was 10/21/2019. By dates, patient is [redacted]w[redacted]d today.   The following portions of the patient's history were reviewed and updated as appropriate: allergies, current medications, past family history, past medical history, past social history, past surgical history and problem list.  Review of Systems Pertinent items noted in HPI and remainder of comprehensive ROS otherwise negative.   Objective:   Blood pressure 109/80, pulse (!) 111, height 5\' 1"  (1.549 m), weight 259 lb 11.2 oz (117.8 kg), last menstrual period 10/21/2019, currently breastfeeding.  Body mass index is 49.07 kg/m. General appearance: alert and no distress, morbid obesity Abdomen: soft, non-tender; bowel sounds normal; no masses,  no organomegaly Pelvic: external genitalia normal, rectovaginal septum normal.  Vagina with small amount of thin white discharge.  Cervix normal appearing, no lesions and no motion tenderness.  Uterus mobile, nontender, normal shape and size.  Adnexae non-palpable, nontender bilaterally.    Labs:  Results for orders placed or performed in visit on 12/06/19  POC Urinalysis Dipstick OB  Result Value Ref Range   Color, UA yellow    Clarity, UA clear    Glucose, UA Negative Negative   Bilirubin, UA neg    Ketones, UA neg    Spec Grav, UA 1.020 1.010 - 1.025   Blood, UA hemo small    pH, UA 5.0 5.0 - 8.0   POC,PROTEIN,UA Negative Negative, Trace, Small (1+), Moderate (2+), Large (3+), 4+   Urobilinogen, UA 0.2 0.2 or 1.0  E.U./dL   Nitrite, UA neg    Leukocytes, UA Moderate (2+) (A) Negative   Appearance yellow;clear    Odor      Assessment:   Threatened miscarriage Pelvic pain  Plan:   Will order pelvic ultrasound for dating/viability for later today. If viable, can f/u for NOB intake as previously scheduled. If non-viable, will discuss further management.  Can take Tylenol prn for pain Previous lab from prior pregnancy notes ABO AB+, no need for Rhogam.    02/05/20, MD [facility]

## 2019-12-06 NOTE — Progress Notes (Signed)
Pt present for extreme cramping and vaginal bleeding at 6 weeks of pregnancy. Pt stated that the cramping was extreme and she was unable to get out of bed this morning and having light bleeding. Pt stated that she has not noticed the bleeding since this morning.

## 2019-12-07 LAB — BETA HCG QUANT (REF LAB): hCG Quant: 23739 m[IU]/mL

## 2019-12-08 ENCOUNTER — Telehealth: Payer: Self-pay | Admitting: Obstetrics and Gynecology

## 2019-12-08 NOTE — Telephone Encounter (Signed)
Patient called resting lab results. She stated results are available on her portal but would like an explanation. Thanks

## 2019-12-09 ENCOUNTER — Telehealth: Payer: Self-pay | Admitting: Obstetrics and Gynecology

## 2019-12-09 NOTE — Telephone Encounter (Signed)
Please see mychart message.

## 2019-12-09 NOTE — Telephone Encounter (Signed)
Pt called in and stated that she saw on her mychart that she go her results from her visit 12/06/2019. The pt is requesting a call back. Please advise

## 2019-12-12 ENCOUNTER — Ambulatory Visit: Payer: BC Managed Care – PPO | Admitting: Professional

## 2019-12-14 ENCOUNTER — Encounter: Payer: Self-pay | Admitting: Family Medicine

## 2019-12-20 ENCOUNTER — Telehealth: Payer: Self-pay | Admitting: Obstetrics and Gynecology

## 2019-12-20 NOTE — Telephone Encounter (Signed)
Called pt to resch appt lmtrc

## 2019-12-21 ENCOUNTER — Encounter: Payer: BC Managed Care – PPO | Admitting: Obstetrics and Gynecology

## 2019-12-21 ENCOUNTER — Other Ambulatory Visit: Payer: BC Managed Care – PPO

## 2019-12-26 ENCOUNTER — Ambulatory Visit: Payer: BC Managed Care – PPO | Admitting: Professional

## 2019-12-29 ENCOUNTER — Ambulatory Visit: Payer: BC Managed Care – PPO | Admitting: Internal Medicine

## 2019-12-30 ENCOUNTER — Encounter: Payer: Self-pay | Admitting: Internal Medicine

## 2020-01-05 ENCOUNTER — Other Ambulatory Visit: Payer: Self-pay

## 2020-01-05 ENCOUNTER — Ambulatory Visit (INDEPENDENT_AMBULATORY_CARE_PROVIDER_SITE_OTHER): Payer: BC Managed Care – PPO

## 2020-01-05 VITALS — BP 124/78 | HR 74 | Ht 61.0 in | Wt 255.5 lb

## 2020-01-05 DIAGNOSIS — Z113 Encounter for screening for infections with a predominantly sexual mode of transmission: Secondary | ICD-10-CM | POA: Diagnosis not present

## 2020-01-05 DIAGNOSIS — Z3481 Encounter for supervision of other normal pregnancy, first trimester: Secondary | ICD-10-CM

## 2020-01-05 DIAGNOSIS — Z0283 Encounter for blood-alcohol and blood-drug test: Secondary | ICD-10-CM | POA: Diagnosis not present

## 2020-01-05 DIAGNOSIS — R638 Other symptoms and signs concerning food and fluid intake: Secondary | ICD-10-CM | POA: Diagnosis not present

## 2020-01-05 DIAGNOSIS — Z3A10 10 weeks gestation of pregnancy: Secondary | ICD-10-CM

## 2020-01-05 NOTE — Progress Notes (Signed)
      Ashley Strickland presents for NOB nurse intake visit. Pregnancy confirmation done at [facility], 11/23/2019 with Hildred Laser, MD.  G 2.  P 1001.  LMP 10/21/2019.  EDD 07/27/2020.  Ga [redacted]w[redacted]d. Pregnancy education material explained and given.  0 cats in the home. 3 dogs in the home.  NOB labs ordered. BMI greater than 30. TSH/HbgA1c ordered. Sickle cell not ordered due to race. HIV and drug screen explained and ordered. Genetic screening discussed. Genetic testing; Unsure. Pt to discuss genetic testing with provider. PNV encouraged. Pt to follow up with provider in  2 weeks for NOB physical and NOB labs.  [facility] Financial Policy reviewed and signed by patient. FMLA form signed by patient.   Patient was unable to do NOB labs draw today due to being dehydrated. Pt will do NOB lab draw during her NOB PE with [facility].

## 2020-01-05 NOTE — Patient Instructions (Signed)
WHAT OB PATIENTS CAN EXPECT   Confirmation of pregnancy and ultrasound ordered if medically indicated-[redacted] weeks gestation  New OB (NOB) intake with nurse and New OB (NOB) labs- [redacted] weeks gestation  New OB (NOB) physical examination with provider- 11/[redacted] weeks gestation  Flu vaccine-[redacted] weeks gestation  Anatomy scan-[redacted] weeks gestation  Glucose tolerance test, blood work to test for anemia, T-dap vaccine-[redacted] weeks gestation  Vaginal swabs/cultures-STD/Group B strep-[redacted] weeks gestation  Appointments every 4 weeks until 28 weeks  Every 2 weeks from 28 weeks until 36 weeks  Weekly visits from 36 weeks until delivery  Morning Sickness  Morning sickness is when you feel sick to your stomach (nauseous) during pregnancy. You may feel sick to your stomach and throw up (vomit). You may feel sick in the morning, but you can feel this way at any time of day. Some women feel very sick to their stomach and cannot stop throwing up (hyperemesis gravidarum). Follow these instructions at home: Medicines  Take over-the-counter and prescription medicines only as told by your doctor. Do not take any medicines until you talk with your doctor about them first.  Taking multivitamins before getting pregnant can stop or lessen the harshness of morning sickness. Eating and drinking  Eat dry toast or crackers before getting out of bed.  Eat 5 or 6 small meals a day.  Eat dry and bland foods like rice and baked potatoes.  Do not eat greasy, fatty, or spicy foods.  Have someone cook for you if the smell of food causes you to feel sick or throw up.  If you feel sick to your stomach after taking prenatal vitamins, take them at night or with a snack.  Eat protein when you need a snack. Nuts, yogurt, and cheese are good choices.  Drink fluids throughout the day.  Try ginger ale made with real ginger, ginger tea made from fresh grated ginger, or ginger candies. General instructions  Do not use any products  that have nicotine or tobacco in them, such as cigarettes and e-cigarettes. If you need help quitting, ask your doctor.  Use an air purifier to keep the air in your house free of smells.  Get lots of fresh air.  Try to avoid smells that make you feel sick.  Try: ? Wearing a bracelet that is used for seasickness (acupressure wristband). ? Going to a doctor who puts thin needles into certain body points (acupuncture) to improve how you feel. Contact a doctor if:  You need medicine to feel better.  You feel dizzy or light-headed.  You are losing weight. Get help right away if:  You feel very sick to your stomach and cannot stop throwing up.  You pass out (faint).  You have very bad pain in your belly. Summary  Morning sickness is when you feel sick to your stomach (nauseous) during pregnancy.  You may feel sick in the morning, but you can feel this way at any time of day.  Making some changes to what you eat may help your symptoms go away. This information is not intended to replace advice given to you by your health care provider. Make sure you discuss any questions you have with your health care provider. Document Revised: 05/29/2017 Document Reviewed: 07/17/2016 Elsevier Patient Education  2020 [employer]. How a Baby Grows During Pregnancy  Pregnancy begins when a female's sperm enters a female's egg (fertilization). Fertilization usually happens in one of the tubes (fallopian tubes) that connect the ovaries to the  womb (uterus). The fertilized egg moves down the fallopian tube to the uterus. Once it reaches the uterus, it implants into the lining of the uterus and begins to grow. For the first 10 weeks, the fertilized egg is called an embryo. After 10 weeks, it is called a fetus. As the fetus continues to grow, it receives oxygen and nutrients through tissue (placenta) that grows to support the developing baby. The placenta is the life support system for the baby. It provides  oxygen and nutrition and removes waste. Learning as much as you can about your pregnancy and how your baby is developing can help you enjoy the experience. It can also make you aware of when there might be a problem and when to ask questions. How long does a typical pregnancy last? A pregnancy usually lasts 280 days, or about 40 weeks. Pregnancy is divided into three periods of growth, also called trimesters:  First trimester: 0-12 weeks.  Second trimester: 13-27 weeks.  Third trimester: 28-40 weeks. The day when your baby is ready to be born (full term) is your estimated date of delivery. How does my baby develop month by month? First month  The fertilized egg attaches to the inside of the uterus.  Some cells will form the placenta. Others will form the fetus.  The arms, legs, brain, spinal cord, lungs, and heart begin to develop.  At the end of the first month, the heart begins to beat. Second month  The bones, inner ear, eyelids, hands, and feet form.  The genitals develop.  By the end of 8 weeks, all major organs are developing. Third month  All of the internal organs are forming.  Teeth develop below the gums.  Bones and muscles begin to grow. The spine can flex.  The skin is transparent.  Fingernails and toenails begin to form.  Arms and legs continue to grow longer, and hands and feet develop.  The fetus is about 3 inches (7.6 cm) long. Fourth month  The placenta is completely formed.  The external sex organs, neck, outer ear, eyebrows, eyelids, and fingernails are formed.  The fetus can hear, swallow, and move its arms and legs.  The kidneys begin to produce urine.  The skin is covered with a white, waxy coating (vernix) and very fine hair (lanugo). Fifth month  The fetus moves around more and can be felt for the first time (quickening).  The fetus starts to sleep and wake up and may begin to suck its finger.  The nails grow to the end of the  fingers.  The organ in the digestive system that makes bile (gallbladder) functions and helps to digest nutrients.  If your baby is a girl, eggs are present in her ovaries. If your baby is a boy, testicles start to move down into his scrotum. Sixth month  The lungs are formed.  The eyes open. The brain continues to develop.  Your baby has fingerprints and toe prints. Your baby's hair grows thicker.  At the end of the second trimester, the fetus is about 9 inches (22.9 cm) long. Seventh month  The fetus kicks and stretches.  The eyes are developed enough to sense changes in light.  The hands can make a grasping motion.  The fetus responds to sound. Eighth month  All organs and body systems are fully developed and functioning.  Bones harden, and taste buds develop. The fetus may hiccup.  Certain areas of the brain are still developing. The skull remains soft.   Ninth month  The fetus gains about  lb (0.23 kg) each week.  The lungs are fully developed.  Patterns of sleep develop.  The fetus's head typically moves into a head-down position (vertex) in the uterus to prepare for birth.  The fetus weighs 6-9 lb (2.72-4.08 kg) and is 19-20 inches (48.26-50.8 cm) long. What can I do to have a healthy pregnancy and help my baby develop? General instructions  Take prenatal vitamins as directed by your health care provider. These include vitamins such as folic acid, iron, calcium, and vitamin D. They are important for healthy development.  Take medicines only as directed by your health care provider. Read labels and ask a pharmacist or your health care provider whether over-the-counter medicines, supplements, and prescription drugs are safe to take during pregnancy.  Keep all follow-up visits as directed by your health care provider. This is important. Follow-up visits include prenatal care and screening tests. How do I know if my baby is developing well? At each prenatal visit,  your health care provider will do several different tests to check on your health and keep track of your baby's development. These include:  Fundal height and position. ? Your health care provider will measure your growing belly from your pubic bone to the top of the uterus using a tape measure. ? Your health care provider will also feel your belly to determine your baby's position.  Heartbeat. ? An ultrasound in the first trimester can confirm pregnancy and show a heartbeat, depending on how far along you are. ? Your health care provider will check your baby's heart rate at every prenatal visit.  Second trimester ultrasound. ? This ultrasound checks your baby's development. It also may show your baby's gender. What should I do if I have concerns about my baby's development? Always talk with your health care provider about any concerns that you may have about your pregnancy and your baby. Summary  A pregnancy usually lasts 280 days, or about 40 weeks. Pregnancy is divided into three periods of growth, also called trimesters.  Your health care provider will monitor your baby's growth and development throughout your pregnancy.  Follow your health care provider's recommendations about taking prenatal vitamins and medicines during your pregnancy.  Talk with your health care provider if you have any concerns about your pregnancy or your developing baby. This information is not intended to replace advice given to you by your health care provider. Make sure you discuss any questions you have with your health care provider. Document Revised: 10/07/2018 Document Reviewed: 04/29/2017 Elsevier Patient Education  2020 [place] of Pregnancy  The first trimester of pregnancy is from week 1 until the end of week 13 (months 1 through 3). During this time, your baby will begin to develop inside you. At 6-8 weeks, the eyes and face are formed, and the heartbeat can be seen on  ultrasound. At the end of 12 weeks, all the baby's organs are formed. Prenatal care is all the medical care you receive before the birth of your baby. Make sure you get good prenatal care and follow all of your doctor's instructions. Follow these instructions at home: Medicines  Take over-the-counter and prescription medicines only as told by your doctor. Some medicines are safe and some medicines are not safe during pregnancy.  Take a prenatal vitamin that contains at least 600 micrograms (mcg) of folic acid.  If you have trouble pooping (constipation), take medicine that will make your stool soft (stool  softener) if your doctor approves. Eating and drinking   Eat regular, healthy meals.  Your doctor will tell you the amount of weight gain that is right for you.  Avoid raw meat and uncooked cheese.  If you feel sick to your stomach (nauseous) or throw up (vomit): ? Eat 4 or 5 small meals a day instead of 3 large meals. ? Try eating a few soda crackers. ? Drink liquids between meals instead of during meals.  To prevent constipation: ? Eat foods that are high in fiber, like fresh fruits and vegetables, whole grains, and beans. ? Drink enough fluids to keep your pee (urine) clear or pale yellow. Activity  Exercise only as told by your doctor. Stop exercising if you have cramps or pain in your lower belly (abdomen) or low back.  Do not exercise if it is too hot, too humid, or if you are in a place of great height (high altitude).  Try to avoid standing for long periods of time. Move your legs often if you must stand in one place for a long time.  Avoid heavy lifting.  Wear low-heeled shoes. Sit and stand up straight.  You can have sex unless your doctor tells you not to. Relieving pain and discomfort  Wear a good support bra if your breasts are sore.  Take warm water baths (sitz baths) to soothe pain or discomfort caused by hemorrhoids. Use hemorrhoid cream if your doctor says  it is okay.  Rest with your legs raised if you have leg cramps or low back pain.  If you have puffy, bulging veins (varicose veins) in your legs: ? Wear support hose or compression stockings as told by your doctor. ? Raise (elevate) your feet for 15 minutes, 3-4 times a day. ? Limit salt in your food. Prenatal care  Schedule your prenatal visits by the twelfth week of pregnancy.  Write down your questions. Take them to your prenatal visits.  Keep all your prenatal visits as told by your doctor. This is important. Safety  Wear your seat belt at all times when driving.  Make a list of emergency phone numbers. The list should include numbers for family, friends, the hospital, and police and fire departments. General instructions  Ask your doctor for a referral to a local prenatal class. Begin classes no later than at the start of month 6 of your pregnancy.  Ask for help if you need counseling or if you need help with nutrition. Your doctor can give you advice or tell you where to go for help.  Do not use hot tubs, steam rooms, or saunas.  Do not douche or use tampons or scented sanitary pads.  Do not cross your legs for long periods of time.  Avoid all herbs and alcohol. Avoid drugs that are not approved by your doctor.  Do not use any tobacco products, including cigarettes, chewing tobacco, and electronic cigarettes. If you need help quitting, ask your doctor. You may get counseling or other support to help you quit.  Avoid cat litter boxes and soil used by cats. These carry germs that can cause birth defects in the baby and can cause a loss of your baby (miscarriage) or stillbirth.  Visit your dentist. At home, brush your teeth with a soft toothbrush. Be gentle when you floss. Contact a doctor if:  You are dizzy.  You have mild cramps or pressure in your lower belly.  You have a nagging pain in your belly area.  You  continue to feel sick to your stomach, you throw up, or  you have watery poop (diarrhea).  You have a bad smelling fluid coming from your vagina.  You have pain when you pee (urinate).  You have increased puffiness (swelling) in your face, hands, legs, or ankles. Get help right away if:  You have a fever.  You are leaking fluid from your vagina.  You have spotting or bleeding from your vagina.  You have very bad belly cramping or pain.  You gain or lose weight rapidly.  You throw up blood. It may look like coffee grounds.  You are around people who have [place] measles, fifth disease, or chickenpox.  You have a very bad headache.  You have shortness of breath.  You have any kind of trauma, such as from a fall or a car accident. Summary  The first trimester of pregnancy is from week 1 until the end of week 13 (months 1 through 3).  To take care of yourself and your unborn baby, you will need to eat healthy meals, take medicines only if your doctor tells you to do so, and do activities that are safe for you and your baby.  Keep all follow-up visits as told by your doctor. This is important as your doctor will have to ensure that your baby is healthy and growing well. This information is not intended to replace advice given to you by your health care provider. Make sure you discuss any questions you have with your health care provider. Document Revised: 10/07/2018 Document Reviewed: 06/24/2016 Elsevier Patient Education  2020 [employer]. Commonly Asked Questions During Pregnancy  Cats: A parasite can be excreted in cat feces.  To avoid exposure you need to have another person empty the little box.  If you must empty the litter box you will need to wear gloves.  Wash your hands after handling your cat.  This parasite can also be found in raw or undercooked meat so this should also be avoided.  Colds, Sore Throats, Flu: Please check your medication sheet to see what you can take for symptoms.  If your symptoms are unrelieved by these  medications please call the office.  Dental Work: Most any dental work [employer] recommends is permitted.  X-rays should only be taken during the first trimester if absolutely necessary.  Your abdomen should be shielded with a lead apron during all x-rays.  Please notify your provider prior to receiving any x-rays.  Novocaine is fine; gas is not recommended.  If your dentist requires a note from [place] prior to dental work please call the office and we will provide one for you.  Exercise: Exercise is an important part of staying healthy during your pregnancy.  You may continue most exercises you were accustomed to prior to pregnancy.  Later in your pregnancy you will most likely notice you have difficulty with activities requiring balance like riding a bicycle.  It is important that you listen to your body and avoid activities that put you at a higher risk of falling.  Adequate rest and staying well hydrated are a must!  If you have questions about the safety of specific activities ask your provider.    Exposure to Children with illness: Try to avoid obvious exposure; report any symptoms to [place] when noted,  If you have chicken pos, red measles or mumps, you should be immune to these diseases.   Please do not take any vaccines while pregnant unless you have checked  with your OB provider.  Fetal Movement: After 28 weeks we recommend you do "kick counts" twice daily.  Lie or sit down in a calm quiet environment and count your baby movements "kicks".  You should feel your baby at least 10 times per hour.  If you have not felt 10 kicks within the first hour get up, walk around and have something sweet to eat or drink then repeat for an additional hour.  If count remains less than 10 per hour notify your provider.  Fumigating: Follow your pest control agent's advice as to how long to stay out of your home.  Ventilate the area well before re-entering.  Hemorrhoids:   Most over-the-counter preparations can be used  during pregnancy.  Check your medication to see what is safe to use.  It is important to use a stool softener or fiber in your diet and to drink lots of liquids.  If hemorrhoids seem to be getting worse please call the office.   Hot Tubs:  Hot tubs Jacuzzis and saunas are not recommended while pregnant.  These increase your internal body temperature and should be avoided.  Intercourse:  Sexual intercourse is safe during pregnancy as long as you are comfortable, unless otherwise advised by your provider.  Spotting may occur after intercourse; report any bright red bleeding that is heavier than spotting.  Labor:  If you know that you are in labor, please go to the hospital.  If you are unsure, please call the office and let us help you decide what to do.  Lifting, straining, etc:  If your job requires heavy lifting or straining please check with your provider for any limitations.  Generally, you should not lift items heavier than that you can lift simply with your hands and arms (no back muscles)  Painting:  Paint fumes do not harm your pregnancy, but may make you ill and should be avoided if possible.  Latex or water based paints have less odor than oils.  Use adequate ventilation while painting.  Permanents & Hair Color:  Chemicals in hair dyes are not recommended as they cause increase hair dryness which can increase hair loss during pregnancy.  " Highlighting" and permanents are allowed.  Dye may be absorbed differently and permanents may not hold as well during pregnancy.  Sunbathing:  Use a sunscreen, as skin burns easily during pregnancy.  Drink plenty of fluids; avoid over heating.  Tanning Beds:  Because their possible side effects are still unknown, tanning beds are not recommended.  Ultrasound Scans:  Routine ultrasounds are performed at approximately 20 weeks.  You will be able to see your baby's general anatomy an if you would like to know the gender this can usually be determined as  well.  If it is questionable when you conceived you may also receive an ultrasound early in your pregnancy for dating purposes.  Otherwise ultrasound exams are not routinely performed unless there is a medical necessity.  Although you can request a scan we ask that you pay for it when conducted because insurance does not cover " patient request" scans.  Work: If your pregnancy proceeds without complications you may work until your due date, unless your physician or employer advises otherwise.  Round Ligament Pain/Pelvic Discomfort:  Sharp, shooting pains not associated with bleeding are fairly common, usually occurring in the second trimester of pregnancy.  They tend to be worse when standing up or when you remain standing for long periods of time.  These are  the result of pressure of certain pelvic ligaments called "round ligaments".  Rest, Tylenol and heat seem to be the most effective relief.  As the womb and fetus grow, they rise out of the pelvis and the discomfort improves.  Please notify the office if your pain seems different than that described.  It may represent a more serious condition.  Common Medications Safe in Pregnancy  Acne:      Constipation:  Benzoyl Peroxide     Colace  Clindamycin      Dulcolax Suppository  Topica Erythromycin     Fibercon  Salicylic Acid      Metamucil         Miralax AVOID:        Senakot   Accutane    Cough:  Retin-A       Cough Drops  Tetracycline      Phenergan w/ Codeine if Rx  Minocycline      Robitussin (Plain & DM)  Antibiotics:     Crabs/Lice:  Ceclor       RID  Cephalosporins    AVOID:  E-Mycins      Kwell  Keflex  Macrobid/Macrodantin   Diarrhea:  Penicillin      Kao-Pectate  Zithromax      Imodium AD         PUSH FLUIDS AVOID:       Cipro     Fever:  Tetracycline      Tylenol (Regular or Extra  Minocycline       Strength)  Levaquin      Extra Strength-Do not          Exceed 8 tabs/24 hrs Caffeine:        <294m/day (equiv. To 1 cup  of coffee or  approx. 3 12 oz sodas)         Gas: Cold/Hayfever:       Gas-X  Benadryl      Mylicon  Claritin       Phazyme  **Claritin-D        Chlor-Trimeton    Headaches:  Dimetapp      ASA-Free Excedrin  Drixoral-Non-Drowsy     Cold Compress  Mucinex (Guaifenasin)     Tylenol (Regular or Extra  Sudafed/Sudafed-12 Hour     Strength)  **Sudafed PE Pseudoephedrine   Tylenol Cold & Sinus     Vicks Vapor Rub  Zyrtec  **AVOID if Problems With Blood Pressure         Heartburn: Avoid lying down for at least 1 hour after meals  Aciphex      Maalox     Rash:  Milk of Magnesia     Benadryl    Mylanta       1% Hydrocortisone Cream  Pepcid  Pepcid Complete   Sleep Aids:  Prevacid      Ambien   Prilosec       Benadryl  Rolaids       Chamomile Tea  Tums (Limit 4/day)     Unisom  Zantac       Tylenol PM         Warm milk-add vanilla or  Hemorrhoids:       Sugar for taste  Anusol/Anusol H.C.  (RX: Analapram 2.5%)  Sugar Substitutes:  Hydrocortisone OTC     Ok in moderation  Preparation H      Tucks        Vaseline lotion applied to tissue with wiping    Herpes:  Throat:  Acyclovir      Oragel  Famvir  Valtrex     Vaccines:         Flu Shot Leg Cramps:       *Gardasil  Benadryl      Hepatitis A         Hepatitis B Nasal Spray:       Pneumovax  Saline Nasal Spray     Polio Booster         Tetanus Nausea:       Tuberculosis test or PPD  Vitamin B6 25 mg TID   AVOID:    Dramamine      *Gardasil  Emetrol       Live Poliovirus  Ginger Root 250 mg QID    MMR (measles, mumps &  High Complex Carbs @ Bedtime    rebella)  Sea Bands-Accupressure    Varicella (Chickenpox)  Unisom 1/2 tab TID     *No known complications           If received before Pain:         Known pregnancy;   Darvocet       Resume series after  Lortab        Delivery  Percocet    Yeast:   Tramadol      Femstat  Tylenol  3      Gyne-lotrimin  Ultram       Monistat  Vicodin           MISC:         All Sunscreens           Hair Coloring/highlights          Insect Repellant's          (Including DEET)         Mystic Tans

## 2020-01-06 LAB — MICROSCOPIC EXAMINATION
Casts: NONE SEEN /lpf
Epithelial Cells (non renal): >10 /hpf — AB (ref 0–10)
WBC, UA: >30 /hpf — AB (ref 0–5)

## 2020-01-06 LAB — URINALYSIS, ROUTINE W REFLEX MICROSCOPIC
Bilirubin, UA: NEGATIVE
Glucose, UA: NEGATIVE
Nitrite, UA: NEGATIVE
Specific Gravity, UA: >=1.03 — AB (ref 1.005–1.030)
Urobilinogen, Ur: 0.2 mg/dL (ref 0.2–1.0)
pH, UA: 5 (ref 5.0–7.5)

## 2020-01-07 LAB — DRUG PROFILE, UR, 9 DRUGS (LABCORP)
Amphetamines, Urine: NEGATIVE ng/mL
Barbiturate Quant, Ur: NEGATIVE ng/mL
Benzodiazepine Quant, Ur: NEGATIVE ng/mL
Cannabinoid Quant, Ur: NEGATIVE ng/mL
Cocaine (Metab.): NEGATIVE ng/mL
Methadone Screen, Urine: NEGATIVE ng/mL
Opiate Quant, Ur: NEGATIVE ng/mL
PCP Quant, Ur: NEGATIVE ng/mL
Propoxyphene: NEGATIVE ng/mL

## 2020-01-07 LAB — GC/CHLAMYDIA PROBE AMP
Chlamydia trachomatis, NAA: NEGATIVE
Neisseria Gonorrhoeae by PCR: NEGATIVE

## 2020-01-07 LAB — CULTURE, OB URINE

## 2020-01-07 LAB — NICOTINE SCREEN, URINE: Cotinine Ql Scrn, Ur: NEGATIVE ng/mL

## 2020-01-07 LAB — URINE CULTURE, OB REFLEX

## 2020-01-16 ENCOUNTER — Ambulatory Visit: Payer: BC Managed Care – PPO | Admitting: Professional

## 2020-01-19 ENCOUNTER — Ambulatory Visit (INDEPENDENT_AMBULATORY_CARE_PROVIDER_SITE_OTHER): Payer: BC Managed Care – PPO | Admitting: Obstetrics and Gynecology

## 2020-01-19 ENCOUNTER — Encounter: Payer: Self-pay | Admitting: Obstetrics and Gynecology

## 2020-01-19 ENCOUNTER — Other Ambulatory Visit: Payer: Self-pay

## 2020-01-19 VITALS — BP 124/74 | HR 78 | Wt 251.2 lb

## 2020-01-19 DIAGNOSIS — Z8659 Personal history of other mental and behavioral disorders: Secondary | ICD-10-CM

## 2020-01-19 DIAGNOSIS — G90A Postural orthostatic tachycardia syndrome (POTS): Secondary | ICD-10-CM

## 2020-01-19 DIAGNOSIS — Z3481 Encounter for supervision of other normal pregnancy, first trimester: Secondary | ICD-10-CM | POA: Diagnosis not present

## 2020-01-19 DIAGNOSIS — Z3A12 12 weeks gestation of pregnancy: Secondary | ICD-10-CM

## 2020-01-19 DIAGNOSIS — I498 Other specified cardiac arrhythmias: Secondary | ICD-10-CM

## 2020-01-19 DIAGNOSIS — Z6841 Body Mass Index (BMI) 40.0 and over, adult: Secondary | ICD-10-CM

## 2020-01-19 DIAGNOSIS — Z8759 Personal history of other complications of pregnancy, childbirth and the puerperium: Secondary | ICD-10-CM

## 2020-01-19 LAB — POCT URINALYSIS DIPSTICK OB
Bilirubin, UA: NEGATIVE
Glucose, UA: NEGATIVE
Ketones, UA: NEGATIVE
Leukocytes, UA: NEGATIVE
Nitrite, UA: NEGATIVE
Spec Grav, UA: >=1.03 — AB (ref 1.010–1.025)
Urobilinogen, UA: 0.2 E.U./dL
pH, UA: 6 (ref 5.0–8.0)

## 2020-01-19 MED ORDER — SCOPOLAMINE 1 MG/3DAYS TD PT72: 1.0000 | MEDICATED_PATCH | TRANSDERMAL | 12 refills | Status: DC

## 2020-01-19 MED ORDER — ONDANSETRON 4 MG PO TBDP: 4.0000 mg | ORAL_TABLET | Freq: Three times a day (TID) | ORAL | 0 refills | Status: DC | PRN

## 2020-01-19 NOTE — Progress Notes (Signed)
OBSTETRIC INITIAL PRENATAL VISIT  Subjective:    Ashley Strickland is being seen today for her first obstetrical visit.  This is a planned pregnancy. She is a G1P0 female at [redacted]w[redacted]d gestation, Estimated Date of Delivery:  07/27/2020 with Patient's last menstrual period was 10/01/2019 . Her obstetrical history is significant for obesity and POTS syndrome. Also with a h/o postpartum depression after last pregnancy Relationship with FOB: husband. Patient does intend to breast feed. Pregnancy history fully reviewed.  Pregnancy currently complicated by nausea and vomiting of pregnancy. Patient states it began approx. 6 weeks ago. Patient states she is constantly nauseous and when she thinks about food it makes it worse. She states she struggles with eating proteins but is able to keep fruits and vegetables down. It worsens when she is anxious. Notes Diclegis is not helping.   [name] is also experiencing lower abdominal and pelvic pain. Patient states it comes and goes from week to week. Stress worsens the pain and it is improved with pressure on her stomach. This pain has been ongoing for "quite some time", even prior to pregnancy.   OB History  Gravida Para Term Preterm AB Living  2 1 1  0 0 1  SAB TAB Ectopic Multiple Live Births  0 0 0 0 1    # Outcome Date GA Lbr Len/2nd Weight Sex Delivery Anes PTL Lv  2 Current           1 Term 09/09/18 [redacted]w[redacted]d / 01:30 3250 g M Vag-Spont EPI  LIV     Name: Ashley Strickland     Apgar1: 8  Apgar5: 9    Gynecologic History:  Last pap smear was: 09/27/19. Results were normal.  Denies history of STIs.  Previous birth control: Switched around with multiple forms of contraception. Last taken in December was OCPs.    Past Medical History:  Diagnosis Date  . Anemia   . Anxiety   . Arrhythmia   . Concussion   . Constipation   . Depression   . Dermatitis   . Joint pain   . Orthostatic hypotension   . Personal history of other mental and behavioral  disorders   . POTS (postural orthostatic tachycardia syndrome)   . Pott's disease   . Vitamin D deficiency      Family History  Problem Relation Age of Onset  . Arthritis Mother   . Polycystic ovary syndrome Mother   . Arrhythmia Mother   . Anxiety disorder Mother   . Depression Mother   . Hodgkin's lymphoma Father   . Depression Father   . Anxiety disorder Father   . Arrhythmia Brother   . COPD Maternal Grandfather   . Heart disease Maternal Grandfather   . Clotting disorder Maternal Grandfather   . Anxiety disorder Maternal Grandfather   . Depression Maternal Grandfather   . Diabetes Neg Hx   . CAD Neg Hx   . Cancer - Other Neg Hx      Past Surgical History:  Procedure Laterality Date  . ADENOIDECTOMY       Social History   Socioeconomic History  . Marital status: Married    Spouse name: patrick  . Number of children: 1  . Years of education: 43  . Highest education level: Some college, no degree  Occupational History  . Occupation: homekeeper  Tobacco Use  . Smoking status: Never Smoker  . Smokeless tobacco: Never Used  Vaping Use  . Vaping Use: Never used  Substance and  Sexual Activity  . Alcohol use: Not Currently  . Drug use: No  . Sexual activity: Yes    Birth control/protection: None  Other Topics Concern  . Not on file  Social History Narrative  . Not on file   Social Determinants of Health   Financial Resource Strain:   . Difficulty of Paying Living Expenses:   Food Insecurity:   . Worried About Programme researcher, broadcasting/film/video in the Last Year:   . Barista in the Last Year:   Transportation Needs:   . Freight forwarder (Medical):   Marland Kitchen Lack of Transportation (Non-Medical):   Physical Activity:   . Days of Exercise per Week:   . Minutes of Exercise per Session:   Stress:   . Feeling of Stress :   Social Connections:   . Frequency of Communication with Friends and Family:   . Frequency of Social Gatherings with Friends and Family:    . Attends Religious Services:   . Active Member of Clubs or Organizations:   . Attends Banker Meetings:   Marland Kitchen Marital Status:   Intimate Partner Violence:   . Fear of Current or Ex-Partner:   . Emotionally Abused:   Marland Kitchen Physically Abused:   . Sexually Abused:      Current Outpatient Medications on File Prior to Visit  Medication Sig Dispense Refill  . ondansetron (ZOFRAN-ODT) 4 MG disintegrating tablet Take 1 tablet (4 mg total) by mouth every 8 (eight) hours as needed for nausea or vomiting. 30 tablet 1  . pantoprazole (PROTONIX) 40 MG tablet Take 1 tablet (40 mg total) by mouth daily. 90 tablet 3  . Prenatal Vit-Fe Fumarate-FA (PRENATAL MULTIVITAMIN) TABS tablet Take 1 tablet by mouth daily at 12 noon.    . triamcinolone ointment (KENALOG) 0.1 % Apply 1 application topically 2 (two) times daily as needed. 30 g 0   No current facility-administered medications on file prior to visit.     Allergies  Allergen Reactions  . Phenergan [Promethazine Hcl] Other (See Comments)    Seizure      Review of Systems General:Not Present- Fever, Weight Loss and Weight Gain. Skin:Not Present- Rash. HEENT:Not Present- Blurred Vision, Headache and Bleeding Gums. Respiratory:Not Present- Difficulty Breathing. Breast:Not Present- Breast Mass. Cardiovascular:Not Present- Chest Pain, Elevated Blood Pressure, Fainting / Blacking Out and Shortness of Breath. Gastrointestinal:Not Present- Abdominal Pain, Constipation.  Present - Nausea and Vomiting. Female Genitourinary:Not Present- Frequency, Painful Urination, Pelvic Pain, Vaginal Bleeding, Vaginal Discharge, Contractions, regular, Fetal Movements Decreased, Urinary Complaints and Vaginal Fluid. Musculoskeletal:Not Present- Back Pain and Leg Cramps. Neurological:Not Present- Headaches, Loss of Consciousness.  Present - Dizziness. Psychiatric:Not Present- Depression.     Objective:   Blood pressure 124/74, pulse 78,  weight (!) 113.9 kg, last menstrual period 10/21/2019, currently breastfeeding.  Body mass index is 47.46 kg/m.  General Appearance:    Alert, cooperative, no distress, appears stated age, morbid obesity  Head:    Normocephalic, without obvious abnormality, atraumatic  Eyes:    PERRL, conjunctiva/corneas clear, EOM's intact, both eyes  Ears:    Normal external ear canals, both ears  Nose:   Nares normal, septum midline, mucosa normal, no drainage or sinus tenderness  Throat:   Lips, mucosa, and tongue normal; teeth and gums normal  Neck:   Supple, symmetrical, trachea midline, no adenopathy; thyroid: no enlargement/tenderness/nodules; no carotid bruit or JVD  Back:     Symmetric, no curvature, ROM normal, no CVA tenderness  Lungs:  Clear to auscultation bilaterally, respirations unlabored  Chest Wall:    No tenderness or deformity   Heart:    Regular rate and rhythm, S1 and S2 normal, no murmur, rub or gallop  Breast Exam:    No tenderness, masses, or nipple abnormality  Abdomen:     Soft, non-tender, bowel sounds active all four quadrants, no masses, no organomegaly.    FHT 168 bpm.  Genitalia:    Pelvic:external genitalia normal, vagina without lesions, discharge, or tenderness, rectovaginal septum  Normal.   Cervix normal in appearance, no cervical motion tenderness, no adnexal masses or tenderness.  Pregnancy positive findings: uterine enlargement: 11 wk size, nontender.   Rectal:    Normal external sphincter.  No hemorrhoids appreciated. Internal exam not done.   Extremities:   Extremities normal, atraumatic, no cyanosis or edema  Pulses:   2+ and symmetric all extremities  Skin:   Skin color, texture, turgor normal, no rashes or lesions  Lymph nodes:   Cervical, supraclavicular, and axillary nodes normal  Neurologic:   CNII-XII intact, normal strength, sensation and reflexes throughout      Assessment:   Pregnancy at 12 weeks and 6 days  Morbid obesity  POTS syndrome  Nausea  and vomiting of pregnancy History of postpartum depression  Plan:  1. Supervision of normal pregnancy  - Initial labs ordered. - Prenatal vitamins encouraged. - Problem list reviewed and updated. - New OB counseling:  The patient has been given an overview regarding routine prenatal care.  . - Prenatal testing, optional genetic testing, and ultrasound use in pregnancy were reviewed.  Patient denies genetic testing.  - Benefits of Breast Feeding were discussed. The patient is encouraged to consider nursing her baby post partum.  2. Morbid obesity - Recommendations regarding diet, weight gain, and exercise in pregnancy were given - A1c ordered today, if elevated, patient will need early glucola.  - Will need referral to Anesthesia for consult secondary to obesity.   3. H/o POTS syndrome   - Patient notes some mild dizziness at times but otherwise normal. Desires to restart scopolamine patches as they helped her last time. Will prescribe.   4. Nausea and vomiting of pregnancy - Diclegis not helping. Will prescribe  Zofran. Discussed eating smaller meals, or dry foods.   5. H/o postpartum depression  - Patient with h/o PP depression, discussed increased risk of occurrence in future pregnancies. WIll continue to monitor symptoms.   Follow up in 4 weeks.   The patient has Medicaid.  [facility] Medicaid Risk Screening Form completed today  50% of 30 min visit spent on counseling and coordination of care.   I have seen and examined the patient with [facility] PA-S.  I have reviewed the record and concur with patient management and plan.   Hildred Laser, MD [facility]

## 2020-01-19 NOTE — Patient Instructions (Signed)
Morning Sickness  Morning sickness is when you feel sick to your stomach (nauseous) during pregnancy. You may feel sick to your stomach and throw up (vomit). You may feel sick in the morning, but you can feel this way at any time of day. Some women feel very sick to their stomach and cannot stop throwing up (hyperemesis gravidarum). Follow these instructions at home: Medicines  Take over-the-counter and prescription medicines only as told by your doctor. Do not take any medicines until you talk with your doctor about them first.  Taking multivitamins before getting pregnant can stop or lessen the harshness of morning sickness. Eating and drinking  Eat dry toast or crackers before getting out of bed.  Eat 5 or 6 small meals a day.  Eat dry and bland foods like rice and baked potatoes.  Do not eat greasy, fatty, or spicy foods.  Have someone cook for you if the smell of food causes you to feel sick or throw up.  If you feel sick to your stomach after taking prenatal vitamins, take them at night or with a snack.  Eat protein when you need a snack. Nuts, yogurt, and cheese are good choices.  Drink fluids throughout the day.  Try ginger ale made with real ginger, ginger tea made from fresh grated ginger, or ginger candies. General instructions  Do not use any products that have nicotine or tobacco in them, such as cigarettes and e-cigarettes. If you need help quitting, ask your doctor.  Use an air purifier to keep the air in your house free of smells.  Get lots of fresh air.  Try to avoid smells that make you feel sick.  Try: ? Wearing a bracelet that is used for seasickness (acupressure wristband). ? Going to a doctor who puts thin needles into certain body points (acupuncture) to improve how you feel. Contact a doctor if:  You need medicine to feel better.  You feel dizzy or light-headed.  You are losing weight. Get help right away if:  You feel very sick to your  stomach and cannot stop throwing up.  You pass out (faint).  You have very bad pain in your belly. Summary  Morning sickness is when you feel sick to your stomach (nauseous) during pregnancy.  You may feel sick in the morning, but you can feel this way at any time of day.  Making some changes to what you eat may help your symptoms go away. This information is not intended to replace advice given to you by your health care provider. Make sure you discuss any questions you have with your health care provider. Document Revised: 05/29/2017 Document Reviewed: 07/17/2016 Elsevier Patient Education  [place]. WHAT OB PATIENTS CAN EXPECT   Confirmation of pregnancy and ultrasound ordered if medically indicated-[redacted] weeks gestation  New OB (NOB) intake with nurse and New OB (NOB) labs- [redacted] weeks gestation  New OB (NOB) physical examination with provider- 11/[redacted] weeks gestation  Flu vaccine-[redacted] weeks gestation  Anatomy scan-[redacted] weeks gestation  Glucose tolerance test, blood work to test for anemia, T-dap vaccine-[redacted] weeks gestation  Vaginal swabs/cultures-STD/Group B strep-[redacted] weeks gestation  Appointments every 4 weeks until 28 weeks  Every 2 weeks from 28 weeks until 36 weeks  Weekly visits from 36 weeks until delivery  Common Medications Safe in Pregnancy  Acne:      Constipation:  Benzoyl Peroxide     Colace  Clindamycin      Dulcolax Suppository  Topica Erythromycin  Fibercon  Salicylic Acid      Metamucil         Miralax AVOID:        Senakot   Accutane    Cough:  Retin-A       Cough Drops  Tetracycline      Phenergan w/ Codeine if Rx  Minocycline      Robitussin (Plain & DM)  Antibiotics:     Crabs/Lice:  Ceclor       RID  Cephalosporins    AVOID:  E-Mycins      Kwell  Keflex  Macrobid/Macrodantin   Diarrhea:  Penicillin      Kao-Pectate  Zithromax      Imodium AD         PUSH FLUIDS AVOID:       Cipro     Fever:  Tetracycline      Tylenol (Regular or  Extra  Minocycline       Strength)  Levaquin      Extra Strength-Do not          Exceed 8 tabs/24 hrs Caffeine:        <245m/day (equiv. To 1 cup of coffee or  approx. 3 12 oz sodas)         Gas: Cold/Hayfever:       Gas-X  Benadryl      Mylicon  Claritin       Phazyme  **Claritin-D        Chlor-Trimeton    Headaches:  Dimetapp      ASA-Free Excedrin  Drixoral-Non-Drowsy     Cold Compress  Mucinex (Guaifenasin)     Tylenol (Regular or Extra  Sudafed/Sudafed-12 Hour     Strength)  **Sudafed PE Pseudoephedrine   Tylenol Cold & Sinus     Vicks Vapor Rub  Zyrtec  **AVOID if Problems With Blood Pressure         Heartburn: Avoid lying down for at least 1 hour after meals  Aciphex      Maalox     Rash:  Milk of Magnesia     Benadryl    Mylanta       1% Hydrocortisone Cream  Pepcid  Pepcid Complete   Sleep Aids:  Prevacid      Ambien   Prilosec       Benadryl  Rolaids       Chamomile Tea  Tums (Limit 4/day)     Unisom  Zantac       Tylenol PM         Warm milk-add vanilla or  Hemorrhoids:       Sugar for taste  Anusol/Anusol H.C.  (RX: Analapram 2.5%)  Sugar Substitutes:  Hydrocortisone OTC     Ok in moderation  Preparation H      Tucks        Vaseline lotion applied to tissue with wiping    Herpes:     Throat:  Acyclovir      Oragel  Famvir  Valtrex     Vaccines:         Flu Shot Leg Cramps:       *Gardasil  Benadryl      Hepatitis A         Hepatitis B Nasal Spray:       Pneumovax  Saline Nasal Spray     Polio Booster         Tetanus Nausea:       Tuberculosis test or PPD  Vitamin B6 25 mg TID   AVOID:    Dramamine      *Gardasil  Emetrol       Live Poliovirus  Ginger Root 250 mg QID    MMR (measles, mumps &  High Complex Carbs @ Bedtime    rebella)  Sea Bands-Accupressure    Varicella (Chickenpox)  Unisom 1/2 tab TID     *No known complications           If received before Pain:         Known pregnancy;   Darvocet       Resume series  after  Lortab        Delivery  Percocet    Yeast:   Tramadol      Femstat  Tylenol 3      Gyne-lotrimin  Ultram       Monistat  Vicodin           MISC:         All Sunscreens           Hair Coloring/highlights          Insect Repellant's          (Including DEET)         Mystic Tans

## 2020-01-19 NOTE — Progress Notes (Signed)
NOB PE- Pt present for initial prenatal care and exam.  Pt c/o of pain in the lower abd/pelvic area that comes and goes. Pt declined genetic testing.

## 2020-01-20 LAB — HEMOGLOBIN A1C
Est. average glucose Bld gHb Est-mCnc: 103 mg/dL
Hgb A1c MFr Bld: 5.2 % (ref 4.8–5.6)

## 2020-01-20 LAB — RPR: RPR Ser Ql: NONREACTIVE

## 2020-01-20 LAB — HEPATITIS B SURFACE ANTIGEN: Hepatitis B Surface Ag: NEGATIVE

## 2020-01-20 LAB — VARICELLA ZOSTER ANTIBODY, IGG: Varicella zoster IgG: <135 index — ABNORMAL LOW (ref >165)

## 2020-01-20 LAB — TOXOPLASMA ANTIBODIES- IGG AND  IGM
Toxoplasma Antibody- IgM: <3 AU/mL (ref 0.0–7.9)
Toxoplasma IgG Ratio: <3 IU/mL (ref 0.0–7.1)

## 2020-01-20 LAB — RUBELLA SCREEN: Rubella Antibodies, IGG: 1.14 index (ref >0.99)

## 2020-01-20 LAB — TSH: TSH: 0.507 u[IU]/mL (ref 0.450–4.500)

## 2020-01-20 LAB — HIV ANTIBODY (ROUTINE TESTING W REFLEX): HIV Screen 4th Generation wRfx: NONREACTIVE

## 2020-01-20 LAB — ABO AND RH: Rh Factor: POSITIVE

## 2020-01-20 LAB — ANTIBODY SCREEN: Antibody Screen: NEGATIVE

## 2020-01-23 ENCOUNTER — Ambulatory Visit: Payer: BC Managed Care – PPO | Admitting: Professional

## 2020-01-27 ENCOUNTER — Emergency Department
Admission: EM | Admit: 2020-01-27 | Discharge: 2020-01-28 | Disposition: A | Discharge status: Home / Self Care | Payer: BC Managed Care – PPO | Attending: Emergency Medicine | Admitting: Emergency Medicine

## 2020-01-27 ENCOUNTER — Encounter: Payer: Self-pay | Admitting: Emergency Medicine

## 2020-01-27 ENCOUNTER — Other Ambulatory Visit: Payer: Self-pay

## 2020-01-27 ENCOUNTER — Emergency Department: Payer: BC Managed Care – PPO

## 2020-01-27 DIAGNOSIS — R1031 Right lower quadrant pain: Secondary | ICD-10-CM | POA: Diagnosis not present

## 2020-01-27 DIAGNOSIS — Z3A14 14 weeks gestation of pregnancy: Secondary | ICD-10-CM | POA: Diagnosis not present

## 2020-01-27 DIAGNOSIS — O26899 Other specified pregnancy related conditions, unspecified trimester: Secondary | ICD-10-CM

## 2020-01-27 DIAGNOSIS — R102 Pelvic and perineal pain: Secondary | ICD-10-CM | POA: Diagnosis not present

## 2020-01-27 DIAGNOSIS — O26891 Other specified pregnancy related conditions, first trimester: Secondary | ICD-10-CM | POA: Insufficient documentation

## 2020-01-27 LAB — BASIC METABOLIC PANEL
Anion gap: 9 (ref 5–15)
BUN: 6 mg/dL (ref 6–20)
CO2: 20 mmol/L — ABNORMAL LOW (ref 22–32)
Calcium: 9.2 mg/dL (ref 8.9–10.3)
Chloride: 106 mmol/L (ref 98–111)
Creatinine, Ser: 0.5 mg/dL (ref 0.44–1.00)
GFR calc Af Amer: >60 mL/min (ref >60)
GFR calc non Af Amer: >60 mL/min (ref >60)
Glucose, Bld: 91 mg/dL (ref 70–99)
Potassium: 3.8 mmol/L (ref 3.5–5.1)
Sodium: 135 mmol/L (ref 135–145)

## 2020-01-27 LAB — CBC
HCT: 37.8 % (ref 36.0–46.0)
Hemoglobin: 13.2 g/dL (ref 12.0–15.0)
MCH: 28.2 pg (ref 26.0–34.0)
MCHC: 34.9 g/dL (ref 30.0–36.0)
MCV: 80.8 fL (ref 80.0–100.0)
Platelets: 280 10*3/uL (ref 150–400)
RBC: 4.68 MIL/uL (ref 3.87–5.11)
RDW: 13.7 % (ref 11.5–15.5)
WBC: 11.7 10*3/uL — ABNORMAL HIGH (ref 4.0–10.5)
nRBC: 0 % (ref 0.0–0.2)

## 2020-01-27 LAB — URINALYSIS, COMPLETE (UACMP) WITH MICROSCOPIC
Bilirubin Urine: NEGATIVE
Glucose, UA: NEGATIVE mg/dL
Ketones, ur: 5 mg/dL — AB
Leukocytes,Ua: NEGATIVE
Nitrite: NEGATIVE
Protein, ur: NEGATIVE mg/dL
Specific Gravity, Urine: 1.02 (ref 1.005–1.030)
pH: 7 (ref 5.0–8.0)

## 2020-01-27 LAB — POCT PREGNANCY, URINE: Preg Test, Ur: POSITIVE — AB

## 2020-01-27 MED ORDER — SODIUM CHLORIDE 0.9% FLUSH: 3.0000 mL | Freq: Once | INTRAVENOUS | Status: DC

## 2020-01-27 MED ORDER — DICYCLOMINE HCL 10 MG PO CAPS
20.0000 mg | ORAL_CAPSULE | Freq: Once | ORAL | Status: AC
  Administered 2020-01-27: 20 mg via ORAL
  Filled 2020-01-27: qty 2

## 2020-01-27 NOTE — Telephone Encounter (Signed)
Patient called in stating she had been communicating back and forth with nurse and that the nurse suggested she go to the ED if her symptoms persisted. Patient was confused because she had sent the nurse a MyChart message days ago and is just now getting a response. Informed patient I would send a message to her nurse to provide more clarity- however the nurse had 24-48 hours to respond, not counting the weekend. Informed patient if she felt like she needed to go to the ED then she was encouraged to do so.

## 2020-01-27 NOTE — ED Notes (Signed)
Discussed with dr Derrill Kay, no imaging at this time

## 2020-01-27 NOTE — ED Triage Notes (Signed)
Pt to ED via POV for lightheadedness, nausea, and pelvic pain x 1 week. Pt states that she called her MD today and they advised her to come to the ED. Pt denies vaginal bleeding at this time. Pt is in NAD.

## 2020-01-27 NOTE — ED Provider Notes (Signed)
[facility] Emergency Department Provider Note    ____________________________________________   I have reviewed the triage vital signs and the nursing notes.   HISTORY  Chief Complaint Pelvic Pain, Nausea, and Dizziness   History limited by: Not Limited   HPI Ashley Strickland is a 24 y.o. female at roughly [redacted] weeks pregnant who presents to the emergency department today because of concern for right lower abdominal pain. The patient states that the pain has been present for roughly 1 week. It does come and go. When it comes it is very severe. She does have some pain in the rest of her abdomen as well. The patient has had some associated nausea as well and states that her nausea for the past week has been worse then she would expect for the pregnancy. The patient denies any fever. Denies any vaginal bleeding.  =  Records reviewed. Per medical record review patient has a history of recent US showing intrauterine pregnancy.  Past Medical History:  Diagnosis Date  . Anemia   . Anxiety   . Arrhythmia   . Concussion   . Constipation   . Depression   . Dermatitis   . Joint pain   . Orthostatic hypotension   . Personal history of other mental and behavioral disorders   . POTS (postural orthostatic tachycardia syndrome)   . Pott's disease   . Vitamin D deficiency     Patient Active Problem List   Diagnosis Date Noted  . History of postpartum depression 01/19/2020  . Vitamin D deficiency 12/05/2019  . Gastroesophageal reflux disease without esophagitis 11/23/2019  . Eczema 10/12/2019  . Dysautonomia (HCC) 10/12/2019  . Anxiety and depression 09/23/2018  . POTS (postural orthostatic tachycardia syndrome) 02/26/2018  . Class 3 severe obesity with body mass index (BMI) of 45.0 to 49.9 in adult (HCC) 02/26/2018  . 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 ADENOIDECTOMY      Prior to Admission medications    Medication Sig Start Date End Date Taking? Authorizing Provider  ondansetron (ZOFRAN-ODT) 4 MG disintegrating tablet Take 1 tablet (4 mg total) by mouth every 8 (eight) hours as needed for nausea or vomiting. 11/23/19   Hildred Laser, MD  ondansetron (ZOFRAN-ODT) 4 MG disintegrating tablet Take 1 tablet (4 mg total) by mouth every 8 (eight) hours as needed for nausea or vomiting. 01/19/20   Hildred Laser, MD  pantoprazole (PROTONIX) 40 MG tablet Take 1 tablet (40 mg total) by mouth daily. 11/23/19   Hildred Laser, MD  Prenatal Vit-Fe Fumarate-FA (PRENATAL MULTIVITAMIN) TABS tablet Take 1 tablet by mouth daily at 12 noon.    [provider]  scopolamine (TRANSDERM-SCOP, 1.5 MG,) 1 MG/3DAYS Place 1 patch (1.5 mg total) onto the skin every 3 (three) days. 01/19/20   Hildred Laser, MD  triamcinolone ointment (KENALOG) 0.1 % Apply 1 application topically 2 (two) times daily as needed. 10/04/19   Danelle Berry, PA-C    Allergies Phenergan [promethazine hcl]  Family History  Problem Relation Age of Onset  . Arthritis Mother   . Polycystic ovary syndrome Mother   . Arrhythmia Mother   . Anxiety disorder Mother   . Depression Mother   . Hodgkin's lymphoma Father   . Depression Father   . Anxiety disorder Father   . Arrhythmia Brother   . COPD Maternal Grandfather   . Heart disease Maternal Grandfather   . Clotting disorder Maternal Grandfather   . Anxiety  disorder Maternal Grandfather   . Depression Maternal Grandfather   . Diabetes Neg Hx   . CAD Neg Hx   . Cancer - Other Neg Hx     Social History Social History   Tobacco Use  . Smoking status: Never Smoker  . Smokeless tobacco: Never Used  Vaping Use  . Vaping Use: Never used  Substance Use Topics  . Alcohol use: Not Currently  . Drug use: No    Review of Systems Constitutional: No fever/chills Eyes: No visual changes. ENT: No sore throat. Cardiovascular: Denies chest pain. Respiratory: Denies shortness of  breath. Gastrointestinal: Positive for right lower quadrant pain and nausea.  Genitourinary: Negative for dysuria. Musculoskeletal: Negative for back pain. Skin: Negative for rash. Neurological: Negative for headaches, focal weakness or numbness.  ____________________________________________   PHYSICAL EXAM:  VITAL SIGNS: ED Triage Vitals  Enc Vitals Group     BP 01/27/20 1534 127/80     Pulse Rate 01/27/20 1534 (!) 108     Resp 01/27/20 1534 16     Temp 01/27/20 1538 97.8 F (36.6 C)     Temp Source 01/27/20 1538 Oral     SpO2 01/27/20 1534 97 %     Weight 01/27/20 1535 (!) 249 lb 1.9 oz (113 kg)     Height 01/27/20 1535 5\' 1"  (1.549 m)     Head Circumference --      Peak Flow --      Pain Score 01/27/20 1534 7   Constitutional: Alert and oriented.  Eyes: Conjunctivae are normal.  ENT      Head: Normocephalic and atraumatic.      Nose: No congestion/rhinnorhea.      Mouth/Throat: Mucous membranes are moist.      Neck: No stridor. Hematological/Lymphatic/Immunilogical: No cervical lymphadenopathy. Cardiovascular: Normal rate, regular rhythm.  No murmurs, rubs, or gallops.  Respiratory: Normal respiratory effort without tachypnea nor retractions. Breath sounds are clear and equal bilaterally. No wheezes/rales/rhonchi. Gastrointestinal: Soft and somewhat diffusely tender to palpation, it is worse in there right lower quadrant. No rebound. No guarding.  Genitourinary: Deferred Musculoskeletal: Normal range of motion in all extremities. No lower extremity edema. Neurologic:  Normal speech and language. No gross focal neurologic deficits are appreciated.  Skin:  Skin is warm, dry and intact. No rash noted. Psychiatric: Mood and affect are normal. Speech and behavior are normal. Patient exhibits appropriate insight and judgment.  ____________________________________________    LABS (pertinent positives/negatives)  Upreg positive BMP wnl except co2 20 CBC wbc 11.7, hgb  13.2, plt 280 UA yellow, hazy, small hgb dipstick, 0-5 rbc and wbc  ____________________________________________   EKG  I, 01/29/20, attending physician, personally viewed and interpreted this EKG  EKG Time: 1542 Rate: 115 Rhythm: sinus tachycardia Axis: normal Intervals: qtc 442 QRS: narrow ST changes: no st elevation Impression: abnormal ekg  ____________________________________________    RADIOLOGY  Phineas Semen ob Single live intrauterine pregnancy. No acute abnormality  ____________________________________________   PROCEDURES  Procedures  ____________________________________________   INITIAL IMPRESSION / ASSESSMENT AND PLAN / ED COURSE  Pertinent labs & imaging results that were available during my care of the patient were reviewed by me and considered in my medical decision making (see chart for details).   Patient presented to the emergency department today because of concerns for right lower quadrant pain in the setting of pregnancy.  Patient is roughly [redacted] weeks pregnant.  Pain has been intermittent over the past week.  On exam patient does have tenderness  somewhat diffusely throughout her abdomen although it is worse in the right lower quadrant.  I did discussion with the patient.  At this point I would think appendicitis very unlikely.  I say this given the intermittent nature of the pain, the lack of fevers and very mild leukocytosis.  I would expect worsening symptoms if it is truly been appendicitis for a week.  Ultrasound was performed which not show any concerning abnormalities.  Patient did redevelop the pain here in the emergency department after my initial exam. Did try bentyl without any significant relief. Patient felt comfortable with tylenol and zofran. Again discussed possibility of obtaining MRI but patient felt comfortable going home at that point.   ____________________________________________   FINAL CLINICAL IMPRESSION(S) / ED  DIAGNOSES  Final diagnoses:  Right adnexal tenderness     Note: This dictation was prepared with Dragon dictation. Any transcriptional errors that result from this process are unintentional     Phineas Semen, MD 01/28/20 [phone]

## 2020-01-28 DIAGNOSIS — O26891 Other specified pregnancy related conditions, first trimester: Secondary | ICD-10-CM | POA: Diagnosis not present

## 2020-01-28 MED ORDER — ONDANSETRON HCL 4 MG PO TABS
4.0000 mg | ORAL_TABLET | Freq: Once | ORAL | Status: AC
  Administered 2020-01-28: 4 mg via ORAL
  Filled 2020-01-28: qty 1

## 2020-01-28 MED ORDER — ACETAMINOPHEN 500 MG PO TABS
1000.0000 mg | ORAL_TABLET | Freq: Once | ORAL | Status: AC
  Administered 2020-01-28: 1000 mg via ORAL
  Filled 2020-01-28: qty 2

## 2020-01-28 NOTE — Discharge Instructions (Addendum)
Please seek medical attention for any high fevers, chest pain, shortness of breath, change in behavior, persistent vomiting, bloody stool or any other new or concerning symptoms.  

## 2020-02-01 ENCOUNTER — Encounter: Payer: Self-pay | Admitting: Obstetrics and Gynecology

## 2020-02-01 ENCOUNTER — Ambulatory Visit (INDEPENDENT_AMBULATORY_CARE_PROVIDER_SITE_OTHER): Payer: BC Managed Care – PPO | Admitting: Obstetrics and Gynecology

## 2020-02-01 VITALS — BP 121/77 | HR 118 | Ht 61.0 in | Wt 251.0 lb

## 2020-02-01 DIAGNOSIS — O26892 Other specified pregnancy related conditions, second trimester: Secondary | ICD-10-CM

## 2020-02-01 DIAGNOSIS — R102 Pelvic and perineal pain: Secondary | ICD-10-CM

## 2020-02-01 DIAGNOSIS — Z3A15 15 weeks gestation of pregnancy: Secondary | ICD-10-CM

## 2020-02-01 MED ORDER — ACETAMINOPHEN-CODEINE #3 300-30 MG PO TABS: 1.0000 | ORAL_TABLET | Freq: Four times a day (QID) | ORAL | 0 refills | Status: DC | PRN

## 2020-02-01 MED ORDER — ONDANSETRON HCL 4 MG PO TABS: 4.0000 mg | ORAL_TABLET | Freq: Three times a day (TID) | ORAL | 1 refills | Status: DC | PRN

## 2020-02-01 NOTE — Progress Notes (Signed)
OB Problem Visit  Subjective:    Ashley Strickland is a 24 y.o. G43P1001 female being seen today for her a problem OB visit. She is following up from recent ER visit on 01/27/2020 for persistent abdominal pain. She is at [redacted]w[redacted]d gestation. Patient reports that their workup was essentially negative, had an ultrasound to rule out pelvic masses in pregnancy. Given a trial of Bentyl in the ER for possible IBS symptoms, however she notes that it did not help her symptoms. Of note, patient has had a h/o pelvic pain prior to pregnancy (was going to be worked up for endometriosis prior to pregnancy) however notes this pain is different. Feels that current pain feels like something is pulling or tearing in her her pelvis (moreso on right side), almost like her pelvis is "breaking in half".  Has been trying Tylelnol .   She also continues to note lightheadedness and nausea.  Has been dealing with nausea of pregnancy, and also reports that when she is in pain she also experiences nausea and vomiting. She does have a history of POTS, which may also be playing a role in her symptoms, as well as probable mild dehydration due to the nausea nad vomiting.     The following portions of the patient's history were reviewed and updated as appropriate: allergies, current medications, past family history, past medical history, past social history, past surgical history and problem list.  Review of Systems Pertinent items are noted in HPI.   Objective:     BP 121/77   Pulse (!) 118   Ht 5\' 1"  (1.549 m)   Wt 251 lb (113.9 kg)   LMP 10/21/2019   BMI 47.43 kg/m  Uterine Size: size equals dates  FHT:  140 bpm  Abdomen: Soft, non-tender. Bowel sounds normal.      Assessment:   Pregnancy 14 and 5/7 weeks Pelvic pain in pregnancy POTS Nausea and vomiting in pregnancy  Plan:   1. Pregnancy at 14 weeks - FHT appreciated today. Continue routine PNC.   2.  Pelvic pain  - Discussion had on referral to pelvic floor  physical therapy during pregnancy as pain will likely continue to worsen as pregnancy advances.  Patient notes that she would like to do this. Also recommended use of pregnancy belly band, notes she has misplaced hers from her prior pregnancy but will look for it.  - Also discussed option of trying a medication stronger than Tylenol. She notes she really does not like to take medications but sometimes the pain is so excruciating that she cannot get out of bed.  Tylenol 1000 mg not helping. Offered short supply of T#3 until seen by physical therapy.   3. POTS - Reiterated no real treatments in pregnancy.  Already using scopolamine patch recently prescribed. If symptoms worsen significantly, can consider follow up with Neurology.   4. Nausea and vomiting - Will refill patient's Zofran. Notes she tries to use it sparingly. Advised that she can use as needed. Refill given.    7/7, MD [facility]

## 2020-02-01 NOTE — Progress Notes (Signed)
Pt present for ED follow-up. Pt states she is having pelvic pain, light headedness, increased nausea.

## 2020-02-02 ENCOUNTER — Encounter: Payer: Self-pay | Admitting: Obstetrics and Gynecology

## 2020-02-02 NOTE — Patient Instructions (Signed)
Abdominal Pain During Pregnancy  Belly (abdominal) pain is common during pregnancy. There are many possible causes. Most of the time, it is not a serious problem. Other times, it can be a sign that something is wrong with the pregnancy. Always tell your doctor if you have belly pain. Follow these instructions at home:  Do not have sex or put anything in your vagina until your pain goes away completely.  Get plenty of rest until your pain gets better.  Drink enough fluid to keep your pee (urine) pale yellow.  Take over-the-counter and prescription medicines only as told by your doctor.  Keep all follow-up visits as told by your doctor. This is important. Contact a doctor if:  Your pain continues or gets worse after resting.  You have lower belly pain that: ? Comes and goes at regular times. ? Spreads to your back. ? Feels like menstrual cramps.  You have pain or burning when you pee (urinate). Get help right away if:  You have a fever or chills.  You have vaginal bleeding.  You are leaking fluid from your vagina.  You are passing tissue from your vagina.  You throw up (vomit) for more than 24 hours.  You have watery poop (diarrhea) for more than 24 hours.  Your baby is moving less than usual.  You feel very weak or faint.  You have shortness of breath.  You have very bad pain in your upper belly. Summary  Belly (abdominal) pain is common during pregnancy. There are many possible causes.  If you have belly pain during pregnancy, tell your doctor right away.  Keep all follow-up visits as told by your doctor. This is important. This information is not intended to replace advice given to you by your health care provider. Make sure you discuss any questions you have with your health care provider. Document Revised: 10/04/2018 Document Reviewed: 09/18/2016 [employer] Patient Education  2020 [employer]  

## 2020-02-15 ENCOUNTER — Encounter: Payer: BC Managed Care – PPO | Admitting: Obstetrics and Gynecology

## 2020-02-21 ENCOUNTER — Other Ambulatory Visit: Payer: Self-pay

## 2020-02-21 ENCOUNTER — Ambulatory Visit: Payer: BC Managed Care – PPO | Attending: Obstetrics and Gynecology | Admitting: Physical Therapy

## 2020-02-21 DIAGNOSIS — R278 Other lack of coordination: Secondary | ICD-10-CM | POA: Insufficient documentation

## 2020-02-21 DIAGNOSIS — R102 Pelvic and perineal pain: Secondary | ICD-10-CM | POA: Diagnosis present

## 2020-02-21 DIAGNOSIS — M6281 Muscle weakness (generalized): Secondary | ICD-10-CM | POA: Insufficient documentation

## 2020-02-21 NOTE — Therapy (Signed)
[facility] [address] [place], [place], [address] Phone: [phone]   Fax:  [phone]  Physical Therapy Evaluation  Patient Details  Name: Ashley Strickland MRN: [id number] Date of Birth: March 19, 1996 Referring Provider (PT): Hildred Laser   Encounter Date: 02/21/2020   PT End of Session - 02/22/20 0816    Visit Number 1    Number of Visits 12    Date for PT Re-Evaluation 05/16/20    Authorization Type IE 02/21/2020    PT Start Time 1410    PT Stop Time 1455    PT Time Calculation (min) 45 min    Activity Tolerance Patient tolerated treatment well    Behavior During Therapy [facility] for tasks assessed/performed           Past Medical History:  Diagnosis Date  . Anemia   . Anxiety   . Arrhythmia   . Concussion   . Constipation   . Depression   . Dermatitis   . Joint pain   . Orthostatic hypotension   . Personal history of other mental and behavioral disorders   . POTS (postural orthostatic tachycardia syndrome)   . Pott's disease   . Vitamin D deficiency     Past Surgical History:  Procedure Laterality Date  . ADENOIDECTOMY      There were no vitals filed for this visit.         PELVIC HEALTH PHYSICAL THERAPY EVALUATION  SCREENING Red Flags: None Have you had any night sweats? Unexplained weight loss? Saddle anesthesia? Unexplained changes in bowel or bladder habits?  Precautions: 17 w pregnant  SUBJECTIVE  Chief Complaint: Patient notes that she has been having extreme pelvic and hip pain to the point that no position is comfortable. Patient finds sitting on a yoga ball helpful but as soon as she stands she feels like everything falls and "breaks." She notes difficulty keeping up with her 72.22 year old. 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 Patient also finds relief in quadruped. Patient notes that prior to pregnancy she had a lot of pelvic pain (different  from current pain) and OB/GYN was suspicious of endometriosis.   Pertinent History:  Falls Negative.  Scoliosis Negative. Pulmonary disease/dysfunction Negative. Surgical history: Negative.   Recent Procedures/Tests/Findings: None  Obstetrical History: G2P1 Deliveries: Vaginal Tearing/Episiotomy: grade 1 tear (?) Birthing position: back  Gynecological History: Hysterectomy: No Vaginal/Abdominal Endometriosis: possible; diagnosis not confirmed Last Menstrual Period: 09/2019 Pain with exam: Yes   Urinary History: Incontinence: Positive. Onset: 2020 Triggers: laughing/sneezing/coughing/jumping.  Amount: Min/Mod. Fluid Intake: 40 oz H20, 16 oz gatorade, occasional juices/sodas Nocturia: 0x/night Frequency of urination: every 3 hours Pain with urination: Positive for cramping at pubic bone Difficulty initiating urination: Negative Frequent UTI: Negative.   Gastrointestinal History: Bristol Stool Chart: Type 1 Frequency of BMs: 1x/day to 3x/week Pain with defecation: Positive for cramping at the pubic bone Straining with defecation: Positive Incontinence: Negative.   Sexual activity/pain: Pain with intercourse: Positive for occasional with both internal and external stimulation.   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  Recent memory is intact.  Remote  memory is intact.  Attention span and concentration are intact.  Expressive speech is intact.  Patient's fund of knowledge is within normal limits for educational level.  POSTURE/OBSERVATIONS:  Lumbar lordosis: increased Iliac crest height: R side elevation Pelvic obliquity: negative Leg length discrepancy: negative  GAIT: Increased B hip ER, decreased foot clearance Trendelenburg R: Positive L: Positive  RANGE OF MOTION:    LEFT RIGHT  Lumbar forward flexion (65):  WFL*    Lumbar extension (30): 15*    Lumbar lateral flexion (25):  [facility]  Thoracic and Lumbar rotation (30 degrees):    WFL* WFL*    SENSATION: Grossly intact to light touch bilateral LEs as determined by testing dermatomes L2-S2 Proprioception and hot/cold testing deferred on this date  STRENGTH: MMT   RLE LLE  Hip Flexion 3+ 3+  Hip Extension 3+ 3+  Hip Abduction (seated) 4 4  Hip Adduction (seated) 4 4  Hip ER  3 3+  Hip IR  4 4  Knee Extension 3 3+  Knee Flexion 3 3+  Dorsiflexion  5 5  Plantarflexion (seated) 5 5   ABDOMINAL: deferred 2/2 to time Palpation: Diastasis: Scar mobility: Rib flare:  SPECIAL TESTS: Centralization and Peripheralization (SN 92, -LR 0.12): Negative SLR (SN 92, -LR 0.29): R: Negative L:  Negative FABER (SN 81): R: Negative L: Negative FADIR (SN 94): R: Negative L: Negative Stork/March (SP 93): R: Negative L: Negative  PHYSICAL PERFORMANCE MEASURES: STS: WFL SLS: < 3 sec prior to bracing foot against calf   EXTERNAL PELVIC EXAM: deferred 2/2 time constraints. Palpation: Breath coordination: Cued Lengthen: Cued Contraction: Cough:    OUTCOME MEASURES: FOTO (PFDI Pain 96; Bowel Constipation 52)   ASSESSMENT Patient is a 24 year old presenting to clinic with chief complaints of pelvic pain during pregnancy. Upon examination, patient demonstrates deficits in pain, posture, strength, body mechanics, joint stability/balance as evidenced by 10/10 pain in the  past week, increased lumbar lordosis, R iliac crest elevation, B Trendelenburg signs, grossly 3+/5 MMT of BLE, inability to maintain SLS > 3 seconds without compensatory mechanisms. Patient's responses on FOTO outcome measures (PFDI Pain 96; Bowel Constipation 52) indicate severe functional limitations/disability/distress. Patient's progress may be limited due to progressive nature of pregnancy and significant comorbidities; however, patient's participation in evaluation is advantageous. Patient will benefit from continued skilled therapeutic intervention to address deficits in pain, posture, strength, body mechanics, joint stability/balance in order to increase function and improve overall QOL.  EDUCATION Patient educated on prognosis, POC, and provided with handout on pregnancy self-management of posture. Patient articulated understanding and returned demonstration. Patient will benefit from further education in order to maximize compliance and understanding for long-term therapeutic gains.  TREATMENT Neuromuscular Re-education: Patient educated on primary functions of the pelvic floor including: posture/balance, sexual pleasure, storage and elimination of waste from the body, abdominal cavity closure, and breath coordination.    Objective measurements completed on examination: See above findings.                    PT Long Term Goals - 02/22/20 [id number]      PT LONG TERM GOAL #1   Title Patient  will demonstrate independence with HEP in order to maximize therapeutic gains and improve carryover from physical therapy sessions to ADLs in the home and community.    Baseline IE: not provided    Time 12    Period Weeks    Status New    Target Date 05/16/20      PT LONG TERM GOAL #2   Title Patient will demonstrate improved function as evidenced by a score of 62 on FOTO measure for full participation in activities at home and in the community.    Baseline IE: Bowel Constipation 52     Time 12    Period Weeks    Status New    Target Date 05/16/20      PT LONG TERM GOAL #3   Title Patient will decrease worst pain as reported on NPRS by at least 2 points to demonstrate clinically significant reduction in pain in order to restore/improve function and overall QOL.    Baseline IE: 10/10 (pubic symphysis)    Time 12    Period Weeks    Status New    Target Date 05/16/20      PT LONG TERM GOAL #4   Title Patient will report "moderate difficulty" with participation in household chores and physical activities on PFDI Pain measure for improved independence and function at home and in the community.    Baseline IE: 'extreme difficulty'    Time 12    Period Weeks    Status New    Target Date 05/16/20                  Plan - 02/22/20 0817    Clinical Impression Statement Patient is a 24 year old presenting to clinic with chief complaints of pelvic pain during pregnancy. Upon examination, patient demonstrates deficits in pain, posture, strength, body mechanics, joint stability/balance as evidenced by 10/10 pain in the past week, increased lumbar lordosis, R iliac crest elevation, B Trendelenburg signs, grossly 3+/5 MMT of BLE, inability to maintain SLS > 3 seconds without compensatory mechanisms. Patient's responses on FOTO outcome measures (PFDI Pain 96; Bowel Constipation 52) indicate severe functional limitations/disability/distress. Patient's progress may be limited due to progressive nature of pregnancy and significant comorbidities; however, patient's participation in evaluation is advantageous. Patient will benefit from continued skilled therapeutic intervention to address deficits in pain, posture, strength, body mechanics, joint stability/balance in order to increase function and improve overall QOL.    Personal Factors and Comorbidities Age;Education;Behavior Pattern;Fitness;Past/Current Experience;Comorbidity 3+;Time since onset of injury/illness/exacerbation     Comorbidities anxiety, depression, POTS, arrythmia, joint pain, dermatitis, personal hx of mental and behavioral disorders    Examination-Activity Limitations Dressing;Sit;Transfers;Bed Mobility;Lift;Squat;Bend;Caring for Others;Continence;Stairs;Stand;Locomotion Level;Sleep    Examination-Participation Restrictions Interpersonal Relationship;Occupation;Laundry;Cleaning;Yard Work;Meal Prep    Stability/Clinical Decision Making Evolving/Moderate complexity    Clinical Decision Making Moderate    Rehab Potential Fair    PT Frequency 1x / week    PT Duration 12 weeks    PT Treatment/Interventions ADLs/Self Care Home Management;Aquatic Therapy;Moist Heat;Cryotherapy;Gait training;Stair training;Functional mobility training;Therapeutic activities;Neuromuscular re-education;Balance training;Therapeutic exercise;Patient/family education;Taping;Dry needling;Passive range of motion;Manual techniques;Compression bandaging    PT Next Visit Plan BLE strengthening; donning/doffing maternity brace    PT Home Exercise Plan none provided at this time    Consulted and Agree with Plan of Care Patient           Patient will benefit from skilled therapeutic intervention in order to improve the following deficits and impairments:  Abnormal gait, Decreased balance, Decreased  endurance, Difficulty walking, Obesity, Pain, Postural dysfunction, Improper body mechanics, Hypermobility, Decreased strength, Decreased coordination, Decreased activity tolerance  Visit Diagnosis: Pelvic pain  Muscle weakness (generalized)  Other lack of coordination     Problem List Patient Active Problem List   Diagnosis Date Noted  . History of postpartum depression 01/19/2020  . Vitamin D deficiency 12/05/2019  . Gastroesophageal reflux disease without esophagitis 11/23/2019  . Eczema 10/12/2019  . Dysautonomia (HCC) 10/12/2019  . Anxiety and depression 09/23/2018  . POTS (postural orthostatic tachycardia syndrome)  02/26/2018  . Class 3 severe obesity with body mass index (BMI) of 45.0 to 49.9 in adult (HCC) 02/26/2018  . 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 [place], [place], [address] Phone: [phone]   Fax:  [phone]  Name: Ashley Strickland MRN: [id number] Date of Birth: 03-29-1996

## 2020-02-22 ENCOUNTER — Ambulatory Visit (INDEPENDENT_AMBULATORY_CARE_PROVIDER_SITE_OTHER): Payer: BC Managed Care – PPO | Admitting: Obstetrics and Gynecology

## 2020-02-22 ENCOUNTER — Other Ambulatory Visit: Payer: Self-pay

## 2020-02-22 ENCOUNTER — Encounter: Payer: Self-pay | Admitting: Obstetrics and Gynecology

## 2020-02-22 VITALS — BP 118/84 | HR 89 | Wt 247.9 lb

## 2020-02-22 DIAGNOSIS — N3001 Acute cystitis with hematuria: Secondary | ICD-10-CM

## 2020-02-22 DIAGNOSIS — Z3482 Encounter for supervision of other normal pregnancy, second trimester: Secondary | ICD-10-CM

## 2020-02-22 DIAGNOSIS — Z3A17 17 weeks gestation of pregnancy: Secondary | ICD-10-CM

## 2020-02-22 MED ORDER — NITROFURANTOIN MONOHYD MACRO 100 MG PO CAPS: 100.0000 mg | ORAL_CAPSULE | Freq: Two times a day (BID) | ORAL | 1 refills | Status: DC

## 2020-02-22 NOTE — Progress Notes (Signed)
ROB: Nausea and vomiting worse over the last 2 weeks.  Patient states she is keeping food down most of the day except over lunch.  We talked about different over-the-counter medications she can try and strategies to try to skip this time of day.  Patient also noted to have hematuria and symptoms of possible UTI.  Prescription for Macrobid given.  Quad screen discussed and identification of chromosomal abnormalities and possible neural tube defects.  Patient declined today but would "like to talk to her husband about the test."  I have informed her that if she wants this test drawn it is typically done between 15 and 20 weeks of pregnancy.  FAS next visit.

## 2020-02-28 ENCOUNTER — Ambulatory Visit: Payer: BC Managed Care – PPO | Admitting: Physical Therapy

## 2020-02-28 ENCOUNTER — Other Ambulatory Visit: Payer: Self-pay

## 2020-02-28 ENCOUNTER — Encounter: Payer: Self-pay | Admitting: Physical Therapy

## 2020-02-28 DIAGNOSIS — R102 Pelvic and perineal pain unspecified side: Secondary | ICD-10-CM

## 2020-02-28 DIAGNOSIS — R278 Other lack of coordination: Secondary | ICD-10-CM

## 2020-02-28 DIAGNOSIS — M6281 Muscle weakness (generalized): Secondary | ICD-10-CM

## 2020-02-28 NOTE — Therapy (Signed)
[facility] [address]. [place], [place], [address] Phone: [phone]   Fax:  [phone]  Physical Therapy Treatment  Patient Details  Name: Ashley Strickland MRN: [id number] Date of Birth: 1996/03/13 Referring Provider (PT): Hildred Laser   Encounter Date: 02/28/2020   PT End of Session - 02/28/20 1356    Visit Number 2    Number of Visits 12    Date for PT Re-Evaluation 05/16/20    Authorization Type IE 02/21/2020    PT Start Time 1400    PT Stop Time 1455    PT Time Calculation (min) 55 min    Activity Tolerance Patient tolerated treatment well    Behavior During Therapy [facility] for tasks assessed/performed           Past Medical History:  Diagnosis Date  . Anemia   . Anxiety   . Arrhythmia   . Concussion   . Constipation   . Depression   . Dermatitis   . Joint pain   . Orthostatic hypotension   . Personal history of other mental and behavioral disorders   . POTS (postural orthostatic tachycardia syndrome)   . Pott's disease   . Vitamin D deficiency     Past Surgical History:  Procedure Laterality Date  . ADENOIDECTOMY      There were no vitals filed for this visit.   Subjective Assessment - 02/28/20 1358    Subjective Patient notes that she did order a replacement belly band but it has not arrived yet. She is currently on Macrobid for UTI; notes that urinalysis was not completed due to an incomplete sample. Patient adds that her symptoms are coming down somewhat. Patient notes significant acid reflux today and notes that she will take a tums when she gets home which should help. 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    Currently in Pain? Yes    Pain Score 6     Pain Location Pelvis    Pain Orientation Right;Anterior;Lateral           TREATMENT  Neuromuscular Re-education: Patient education on isometric muscle contraction for force  closure and joint compression to improve proprioception.  BLE isometric series for joint stabilization and improved proprioception:  Hip abduction, 20 x 5 sec  Hip adduction, 20 x 5 sec  Hamstring, 20 x 5 sec  Quad, 20 x 5 sec  Hip ER, 20 x 5 sec  Hip IR, 10 x 5 sec Seated TrA activation with coordinated breath for improved postural control and force closure of pubic symphysis. Patient education on sleeping postures for decreased hip/pelvic pain and decreased acid-reflux; encouraged semifowlers. Provided visual demonstration and offered handout.  Patient educated throughout session on appropriate technique and form using multi-modal cueing, HEP, and activity modification. Patient articulated understanding and returned demonstration.  Patient Response to interventions: Denies increase in pain.  ASSESSMENT Patient presents to clinic with excellent motivation to participate in therapy. Patient demonstrates deficits in pain, posture, strength, body mechanics, joint stability/balance. Patient able to perform BLE and pelvic isometric activities without pain aggravation during today's session and responded positively to active interventions. Patient will benefit from continued skilled therapeutic intervention to address remaining deficits in pain, posture, strength, body mechanics, joint stability/balance in order to increase function, and improve overall QOL.      PT Long Term Goals - 02/22/20 6767      PT LONG TERM GOAL #  1   Title Patient will demonstrate independence with HEP in order to maximize therapeutic gains and improve carryover from physical therapy sessions to ADLs in the home and community.    Baseline IE: not provided    Time 12    Period Weeks    Status New    Target Date 05/16/20      PT LONG TERM GOAL #2   Title Patient will demonstrate improved function as evidenced by a score of 62 on FOTO measure for full participation in activities at home and in the community.     Baseline IE: Bowel Constipation 52    Time 12    Period Weeks    Status New    Target Date 05/16/20      PT LONG TERM GOAL #3   Title Patient will decrease worst pain as reported on NPRS by at least 2 points to demonstrate clinically significant reduction in pain in order to restore/improve function and overall QOL.    Baseline IE: 10/10 (pubic symphysis)    Time 12    Period Weeks    Status New    Target Date 05/16/20      PT LONG TERM GOAL #4   Title Patient will report "moderate difficulty" with participation in household chores and physical activities on PFDI Pain measure for improved independence and function at home and in the community.    Baseline IE: 'extreme difficulty'    Time 12    Period Weeks    Status New    Target Date 05/16/20                 Plan - 02/28/20 1357    Clinical Impression Statement Patient presents to clinic with excellent motivation to participate in therapy. Patient demonstrates deficits in pain, posture, strength, body mechanics, joint stability/balance. Patient able to perform BLE and pelvic isometric activities without pain aggravation during today's session and responded positively to active interventions. Patient will benefit from continued skilled therapeutic intervention to address remaining deficits in pain, posture, strength, body mechanics, joint stability/balance in order to increase function, and improve overall QOL.    Personal Factors and Comorbidities Age;Education;Behavior Pattern;Fitness;Past/Current Experience;Comorbidity 3+;Time since onset of injury/illness/exacerbation    Comorbidities anxiety, depression, POTS, arrythmia, joint pain, dermatitis, personal hx of mental and behavioral disorders    Examination-Activity Limitations Dressing;Sit;Transfers;Bed Mobility;Lift;Squat;Bend;Caring for Others;Continence;Stairs;Stand;Locomotion Level;Sleep    Examination-Participation Restrictions Interpersonal  Relationship;Occupation;Laundry;Cleaning;Yard Work;Meal Prep    Stability/Clinical Decision Making Evolving/Moderate complexity    Rehab Potential Fair    PT Frequency 1x / week    PT Duration 12 weeks    PT Treatment/Interventions ADLs/Self Care Home Management;Aquatic Therapy;Moist Heat;Cryotherapy;Gait training;Stair training;Functional mobility training;Therapeutic activities;Neuromuscular re-education;Balance training;Therapeutic exercise;Patient/family education;Taping;Dry needling;Passive range of motion;Manual techniques;Compression bandaging    PT Next Visit Plan BLE strengthening; donning/doffing maternity brace    PT Home Exercise Plan none provided at this time    Consulted and Agree with Plan of Care Patient           Patient will benefit from skilled therapeutic intervention in order to improve the following deficits and impairments:  Abnormal gait, Decreased balance, Decreased endurance, Difficulty walking, Obesity, Pain, Postural dysfunction, Improper body mechanics, Hypermobility, Decreased strength, Decreased coordination, Decreased activity tolerance  Visit Diagnosis: Pelvic pain  Muscle weakness (generalized)  Other lack of coordination     Problem List Patient Active Problem List   Diagnosis Date Noted  . History of postpartum depression 01/19/2020  . Vitamin D deficiency 12/05/2019  .  Gastroesophageal reflux disease without esophagitis 11/23/2019  . Eczema 10/12/2019  . Dysautonomia (HCC) 10/12/2019  . Anxiety and depression 09/23/2018  . POTS (postural orthostatic tachycardia syndrome) 02/26/2018  . Class 3 severe obesity with body mass index (BMI) of 45.0 to 49.9 in adult (HCC) 02/26/2018  . 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 [place], [place], [address] Phone: [phone]   Fax:  [phone]   Name: Ashley Strickland MRN:  [id number] Date of Birth: [birth year]

## 2020-03-07 ENCOUNTER — Ambulatory Visit: Payer: BC Managed Care – PPO | Admitting: Physical Therapy

## 2020-03-13 ENCOUNTER — Ambulatory Visit: Payer: BC Managed Care – PPO | Attending: Obstetrics and Gynecology | Admitting: Physical Therapy

## 2020-03-13 ENCOUNTER — Other Ambulatory Visit: Payer: Self-pay

## 2020-03-13 ENCOUNTER — Encounter: Payer: Self-pay | Admitting: Physical Therapy

## 2020-03-13 DIAGNOSIS — R278 Other lack of coordination: Secondary | ICD-10-CM | POA: Diagnosis present

## 2020-03-13 DIAGNOSIS — M6281 Muscle weakness (generalized): Secondary | ICD-10-CM | POA: Diagnosis present

## 2020-03-13 DIAGNOSIS — R102 Pelvic and perineal pain unspecified side: Secondary | ICD-10-CM

## 2020-03-13 NOTE — Therapy (Signed)
[facility] [address] [place], [place], [address] Phone: [phone]   Fax:  [phone]  Physical Therapy Treatment  Patient Details  Name: Ashley Strickland MRN: [id number] Date of Birth: 09/11/1995 Referring Provider (PT): Hildred Laser   Encounter Date: 03/13/2020   PT End of Session - 03/13/20 1818    Visit Number 3    Number of Visits 12    Date for PT Re-Evaluation 05/16/20    Authorization Type IE 02/21/2020    PT Start Time 1400    PT Stop Time 1454    PT Time Calculation (min) 54 min    Activity Tolerance Patient tolerated treatment well    Behavior During Therapy [facility] for tasks assessed/performed           Past Medical History:  Diagnosis Date  . Anemia   . Anxiety   . Arrhythmia   . Concussion   . Constipation   . Depression   . Dermatitis   . Joint pain   . Orthostatic hypotension   . Personal history of other mental and behavioral disorders   . POTS (postural orthostatic tachycardia syndrome)   . Pott's disease   . Vitamin D deficiency     Past Surgical History:  Procedure Laterality Date  . ADENOIDECTOMY      There were no vitals filed for this visit.   Subjective Assessment - 03/13/20 1402    Subjective Patient reports that her nausea and dizziness has subsided. Patient notes that she is feeling alright today as far as pain goes. Patient adds that if she moves the wrong way it hurts a little bit.    Currently in Pain? Yes    Pain Score 2     Pain Location Pelvis           TREATMENT  Neuromuscular Re-education: Semi-recumbent diaphragmatic breathing with VCs and TCs for downregulation of the nervous system and improved management of IAP Semi-recumbent, TrA activation with exhalation (2x10). VCs and TCs to decrease compensatory patterns and minimize aggravation of the lumbar paraspinals. Physioball mobility series for decreased pain and improved posture:  A/P pelvic tilts with  coordinated breath and TrA activation  Lateral pelvic tilts  Pelvic clocks, B  Seated TrA activation  Lumbar flexion stretch with TrA activation  Lumbar flexion and rotation, B with TrA activation Belly band donning and doffing education for improved postural support and pain modulation.   Patient educated throughout session on appropriate technique and form using multi-modal cueing, HEP, and activity modification. Patient articulated understanding and returned demonstration.  Patient Response to interventions: Denies increase in pain.  ASSESSMENT Patient presents to clinic with excellent motivation to participate in therapy. Patient demonstrates deficits in pain, posture, strength, body mechanics, joint stability/balance. Patient able to perform all postural control and mobility activities with good form and controlled pain during today's session and responded positively to active interventions. Patient will benefit from continued skilled therapeutic intervention to address remaining deficits in pain, posture, strength, body mechanics, joint stability/balance in order to increase function, and improve overall QOL.       PT Long Term Goals - 02/22/20 9024      PT LONG TERM GOAL #1   Title Patient will demonstrate independence with HEP in order to maximize therapeutic gains and improve carryover from physical therapy sessions to ADLs in the home and community.    Baseline IE: not provided    Time 12    Period Weeks  Status New    Target Date 05/16/20      PT LONG TERM GOAL #2   Title Patient will demonstrate improved function as evidenced by a score of 62 on FOTO measure for full participation in activities at home and in the community.    Baseline IE: Bowel Constipation 52    Time 12    Period Weeks    Status New    Target Date 05/16/20      PT LONG TERM GOAL #3   Title Patient will decrease worst pain as reported on NPRS by at least 2 points to demonstrate clinically  significant reduction in pain in order to restore/improve function and overall QOL.    Baseline IE: 10/10 (pubic symphysis)    Time 12    Period Weeks    Status New    Target Date 05/16/20      PT LONG TERM GOAL #4   Title Patient will report "moderate difficulty" with participation in household chores and physical activities on PFDI Pain measure for improved independence and function at home and in the community.    Baseline IE: 'extreme difficulty'    Time 12    Period Weeks    Status New    Target Date 05/16/20                 Plan - 03/13/20 1818    Clinical Impression Statement Patient presents to clinic with excellent motivation to participate in therapy. Patient demonstrates deficits in pain, posture, strength, body mechanics, joint stability/balance. Patient able to perform all postural control and mobility activities with good form and controlled pain during today's session and responded positively to active interventions. Patient will benefit from continued skilled therapeutic intervention to address remaining deficits in pain, posture, strength, body mechanics, joint stability/balance in order to increase function, and improve overall QOL.    Personal Factors and Comorbidities Age;Education;Behavior Pattern;Fitness;Past/Current Experience;Comorbidity 3+;Time since onset of injury/illness/exacerbation    Comorbidities anxiety, depression, POTS, arrythmia, joint pain, dermatitis, personal hx of mental and behavioral disorders    Examination-Activity Limitations Dressing;Sit;Transfers;Bed Mobility;Lift;Squat;Bend;Caring for Others;Continence;Stairs;Stand;Locomotion Level;Sleep    Examination-Participation Restrictions Interpersonal Relationship;Occupation;Laundry;Cleaning;Yard Work;Meal Prep    Stability/Clinical Decision Making Evolving/Moderate complexity    Rehab Potential Fair    PT Frequency 1x / week    PT Duration 12 weeks    PT Treatment/Interventions ADLs/Self Care  Home Management;Aquatic Therapy;Moist Heat;Cryotherapy;Gait training;Stair training;Functional mobility training;Therapeutic activities;Neuromuscular re-education;Balance training;Therapeutic exercise;Patient/family education;Taping;Dry needling;Passive range of motion;Manual techniques;Compression bandaging    PT Next Visit Plan BLE strengthening; donning/doffing maternity brace    PT Home Exercise Plan none provided at this time    Consulted and Agree with Plan of Care Patient           Patient will benefit from skilled therapeutic intervention in order to improve the following deficits and impairments:  Abnormal gait, Decreased balance, Decreased endurance, Difficulty walking, Obesity, Pain, Postural dysfunction, Improper body mechanics, Hypermobility, Decreased strength, Decreased coordination, Decreased activity tolerance  Visit Diagnosis: Pelvic pain  Muscle weakness (generalized)  Other lack of coordination     Problem List Patient Active Problem List   Diagnosis Date Noted  . History of postpartum depression 01/19/2020  . Vitamin D deficiency 12/05/2019  . Gastroesophageal reflux disease without esophagitis 11/23/2019  . Eczema 10/12/2019  . Dysautonomia (HCC) 10/12/2019  . Anxiety and depression 09/23/2018  . POTS (postural orthostatic tachycardia syndrome) 02/26/2018  . Class 3 severe obesity with body mass index (BMI) of 45.0 to 49.9  in adult [facility]) 02/26/2018  . 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 [place], [place], [address] Phone: [phone]   Fax:  [phone]  Name: Ashley Strickland MRN: [id number] Date of Birth: 1996-03-04

## 2020-03-19 ENCOUNTER — Telehealth: Payer: Self-pay | Admitting: Obstetrics and Gynecology

## 2020-03-19 NOTE — Telephone Encounter (Signed)
OB 21 3/7  Around 10:45 pt vomited 5 x in an hour. Currently she feels better.  Was about to eat a chicken pot pie. Drinking Gatorade/water.   Pt did take a Zofran. Advised pt to not let her stomach get empty. Try and eat every couple of hours. Stay hydrated with Gatorade, ginger ale, water.   Pt voiced understanding.

## 2020-03-19 NOTE — Telephone Encounter (Signed)
Mother called this morning stating patient is having a hard time keeping anything down. Has concerns for dehydration and would like medical advise. She requested that we call her as her daughter is unable to answer the phone due to her not feeling well.

## 2020-03-20 ENCOUNTER — Encounter: Payer: Self-pay | Admitting: Physical Therapy

## 2020-03-20 ENCOUNTER — Ambulatory Visit: Payer: BC Managed Care – PPO | Admitting: Physical Therapy

## 2020-03-20 ENCOUNTER — Other Ambulatory Visit: Payer: Self-pay

## 2020-03-20 DIAGNOSIS — R102 Pelvic and perineal pain: Secondary | ICD-10-CM

## 2020-03-20 DIAGNOSIS — M6281 Muscle weakness (generalized): Secondary | ICD-10-CM

## 2020-03-20 DIAGNOSIS — R278 Other lack of coordination: Secondary | ICD-10-CM

## 2020-03-20 NOTE — Therapy (Signed)
[facility] [address] [place], [place], [address] Phone: [phone]   Fax:  [phone]  Physical Therapy Treatment  Patient Details  Name: Ashley Strickland MRN: [id number] Date of Birth: 1995-08-29 Referring Provider (PT): Hildred Laser   Encounter Date: 03/20/2020   PT End of Session - 03/20/20 1402    Visit Number 4    Number of Visits 12    Date for PT Re-Evaluation 05/16/20    Authorization Type IE 02/21/2020    PT Start Time 1358    PT Stop Time 1428    PT Time Calculation (min) 30 min    Activity Tolerance Patient limited by fatigue;Other (comment)   Treatment limited secondary to nausea   Behavior During Therapy [facility] for tasks assessed/performed           Past Medical History:  Diagnosis Date  . Anemia   . Anxiety   . Arrhythmia   . Concussion   . Constipation   . Depression   . Dermatitis   . Joint pain   . Orthostatic hypotension   . Personal history of other mental and behavioral disorders   . POTS (postural orthostatic tachycardia syndrome)   . Pott's disease   . Vitamin D deficiency     Past Surgical History:  Procedure Laterality Date  . ADENOIDECTOMY      There were no vitals filed for this visit.   Subjective Assessment - 03/20/20 1359    Subjective Patient notes she is feeling a lot better today. She notes that she spent most of yesterday with intense nausea and emesis, which did not resolve until the late afternoon/evening. Patient denies any urinary leakage with increased emesis. Patient notes some increased soreness in her abdominal muscles. Patient also notes increased pain at pubic symphysis.    Currently in Pain? Yes    Pain Score 4     Pain Location Pelvis           TREATMENT Manual Therapy: STM and TPR performed to B proximal gluteals to allow for decreased tension and pain and improved posture and function  Neuromuscular Re-education: Postural recommendations for improved  tolerance to potential birthing positions. Patient education via handout on strategies to decreased PFM strain during delivery.    Patient educated throughout session on appropriate technique and form using multi-modal cueing, HEP, and activity modification. Patient articulated understanding and returned demonstration.  Patient Response to interventions: Comfortable to return when feeling better.  ASSESSMENT Patient presents to clinic with good motivation to participate in therapy despite feeling a bit fatigued from increased nausea and emesis yesterday. Patient demonstrates deficits in pain, posture, strength, body mechanics, joint stability/balance. Patient tolerating limited intervention during today's session 2/2 to continued upset stomach and discomfort, but responded positively to educational interventions. Patient will benefit from continued skilled therapeutic intervention to address remaining deficits in pain, posture, strength, body mechanics, joint stability/balance in order to increase function, and improve overall QOL.      PT Long Term Goals - 02/22/20 4967      PT LONG TERM GOAL #1   Title Patient will demonstrate independence with HEP in order to maximize therapeutic gains and improve carryover from physical therapy sessions to ADLs in the home and community.    Baseline IE: not provided    Time 12    Period Weeks    Status New    Target Date 05/16/20      PT LONG TERM GOAL #2  Title Patient will demonstrate improved function as evidenced by a score of 62 on FOTO measure for full participation in activities at home and in the community.    Baseline IE: Bowel Constipation 52    Time 12    Period Weeks    Status New    Target Date 05/16/20      PT LONG TERM GOAL #3   Title Patient will decrease worst pain as reported on NPRS by at least 2 points to demonstrate clinically significant reduction in pain in order to restore/improve function and overall QOL.    Baseline  IE: 10/10 (pubic symphysis)    Time 12    Period Weeks    Status New    Target Date 05/16/20      PT LONG TERM GOAL #4   Title Patient will report "moderate difficulty" with participation in household chores and physical activities on PFDI Pain measure for improved independence and function at home and in the community.    Baseline IE: 'extreme difficulty'    Time 12    Period Weeks    Status New    Target Date 05/16/20                 Plan - 03/20/20 1403    Clinical Impression Statement Patient presents to clinic with good motivation to participate in therapy despite feeling a bit fatigued from increased nausea and emesis yesterday. Patient demonstrates deficits in pain, posture, strength, body mechanics, joint stability/balance. Patient tolerating limited intervention during today's session 2/2 to continued upset stomach and discomfort, but responded positively to educational interventions. Patient will benefit from continued skilled therapeutic intervention to address remaining deficits in pain, posture, strength, body mechanics, joint stability/balance in order to increase function, and improve overall QOL.    Personal Factors and Comorbidities Age;Education;Behavior Pattern;Fitness;Past/Current Experience;Comorbidity 3+;Time since onset of injury/illness/exacerbation    Comorbidities anxiety, depression, POTS, arrythmia, joint pain, dermatitis, personal hx of mental and behavioral disorders    Examination-Activity Limitations Dressing;Sit;Transfers;Bed Mobility;Lift;Squat;Bend;Caring for Others;Continence;Stairs;Stand;Locomotion Level;Sleep    Examination-Participation Restrictions Interpersonal Relationship;Occupation;Laundry;Cleaning;Yard Work;Meal Prep    Stability/Clinical Decision Making Evolving/Moderate complexity    Rehab Potential Fair    PT Frequency 1x / week    PT Duration 12 weeks    PT Treatment/Interventions ADLs/Self Care Home Management;Aquatic Therapy;Moist  Heat;Cryotherapy;Gait training;Stair training;Functional mobility training;Therapeutic activities;Neuromuscular re-education;Balance training;Therapeutic exercise;Patient/family education;Taping;Dry needling;Passive range of motion;Manual techniques;Compression bandaging    PT Next Visit Plan BLE strengthening; donning/doffing maternity brace    PT Home Exercise Plan none provided at this time    Consulted and Agree with Plan of Care Patient           Patient will benefit from skilled therapeutic intervention in order to improve the following deficits and impairments:  Abnormal gait, Decreased balance, Decreased endurance, Difficulty walking, Obesity, Pain, Postural dysfunction, Improper body mechanics, Hypermobility, Decreased strength, Decreased coordination, Decreased activity tolerance  Visit Diagnosis: Pelvic pain  Muscle weakness (generalized)  Other lack of coordination     Problem List Patient Active Problem List   Diagnosis Date Noted  . History of postpartum depression 01/19/2020  . Vitamin D deficiency 12/05/2019  . Gastroesophageal reflux disease without esophagitis 11/23/2019  . Eczema 10/12/2019  . Dysautonomia (HCC) 10/12/2019  . Anxiety and depression 09/23/2018  . POTS (postural orthostatic tachycardia syndrome) 02/26/2018  . Class 3 severe obesity with body mass index (BMI) of 45.0 to 49.9 in adult (HCC) 02/26/2018  . 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 [place], [place], [address] Phone: [phone]   Fax:  [phone]  Name: Kinslei Labine MRN: [id number] Date of Birth: 01-Mar-1996

## 2020-03-21 ENCOUNTER — Ambulatory Visit (INDEPENDENT_AMBULATORY_CARE_PROVIDER_SITE_OTHER): Payer: BC Managed Care – PPO | Admitting: Obstetrics and Gynecology

## 2020-03-21 ENCOUNTER — Encounter: Payer: Self-pay | Admitting: Obstetrics and Gynecology

## 2020-03-21 ENCOUNTER — Other Ambulatory Visit: Payer: Self-pay | Admitting: Obstetrics and Gynecology

## 2020-03-21 ENCOUNTER — Ambulatory Visit (INDEPENDENT_AMBULATORY_CARE_PROVIDER_SITE_OTHER): Payer: BC Managed Care – PPO

## 2020-03-21 VITALS — BP 124/81 | HR 74 | Wt 250.9 lb

## 2020-03-21 DIAGNOSIS — R102 Pelvic and perineal pain: Secondary | ICD-10-CM

## 2020-03-21 DIAGNOSIS — Z3A21 21 weeks gestation of pregnancy: Secondary | ICD-10-CM

## 2020-03-21 DIAGNOSIS — O26899 Other specified pregnancy related conditions, unspecified trimester: Secondary | ICD-10-CM | POA: Diagnosis not present

## 2020-03-21 DIAGNOSIS — Z3482 Encounter for supervision of other normal pregnancy, second trimester: Secondary | ICD-10-CM

## 2020-03-21 DIAGNOSIS — N926 Irregular menstruation, unspecified: Secondary | ICD-10-CM | POA: Diagnosis not present

## 2020-03-21 LAB — POCT URINALYSIS DIPSTICK OB
Bilirubin, UA: NEGATIVE
Glucose, UA: NEGATIVE
Ketones, UA: NEGATIVE
Leukocytes, UA: NEGATIVE
Nitrite, UA: NEGATIVE
POC,PROTEIN,UA: NEGATIVE
Spec Grav, UA: >=1.03 — AB (ref 1.010–1.025)
Urobilinogen, UA: 0.2 E.U./dL
pH, UA: 6.5 (ref 5.0–8.0)

## 2020-03-21 MED ORDER — METOCLOPRAMIDE HCL 10 MG PO TABS
10.0000 mg | ORAL_TABLET | Freq: Four times a day (QID) | ORAL | 2 refills | Status: DC | PRN
Start: 2020-03-21 — End: 2020-07-26

## 2020-03-21 MED ORDER — SERTRALINE HCL 50 MG PO TABS: 50.0000 mg | ORAL_TABLET | Freq: Every day | ORAL | 11 refills | Status: DC

## 2020-03-21 NOTE — Patient Instructions (Addendum)
Second Trimester of Pregnancy The second trimester is from week 14 through week 27 (months 4 through 6). The second trimester is often a time when you feel your best. Your body has adjusted to being pregnant, and you begin to feel better physically. Usually, morning sickness has lessened or quit completely, you may have more energy, and you may have an increase in appetite. The second trimester is also a time when the fetus is growing rapidly. At the end of the sixth month, the fetus is about 9 inches long and weighs about 1 pounds. You will likely begin to feel the baby move (quickening) between 16 and 20 weeks of pregnancy. Body changes during your second trimester Your body continues to go through many changes during your second trimester. The changes vary from woman to woman.  Your weight will continue to increase. You will notice your lower abdomen bulging out.  You may begin to get stretch marks on your hips, abdomen, and breasts.  You may develop headaches that can be relieved by medicines. The medicines should be approved by your health care provider.  You may urinate more often because the fetus is pressing on your bladder.  You may develop or continue to have heartburn as a result of your pregnancy.  You may develop constipation because certain hormones are causing the muscles that push waste through your intestines to slow down.  You may develop hemorrhoids or swollen, bulging veins (varicose veins).  You may have back pain. This is caused by: ? Weight gain. ? Pregnancy hormones that are relaxing the joints in your pelvis. ? A shift in weight and the muscles that support your balance.  Your breasts will continue to grow and they will continue to become tender.  Your gums may bleed and may be sensitive to brushing and flossing.  Dark spots or blotches (chloasma, mask of pregnancy) may develop on your face. This will likely fade after the baby is born.  A dark line from your  belly button to the pubic area (linea nigra) may appear. This will likely fade after the baby is born.  You may have changes in your hair. These can include thickening of your hair, rapid growth, and changes in texture. Some women also have hair loss during or after pregnancy, or hair that feels dry or thin. Your hair will most likely return to normal after your baby is born. What to expect at prenatal visits During a routine prenatal visit:  You will be weighed to make sure you and the fetus are growing normally.  Your blood pressure will be taken.  Your abdomen will be measured to track your baby's growth.  The fetal heartbeat will be listened to.  Any test results from the previous visit will be discussed. Your health care provider may ask you:  How you are feeling.  If you are feeling the baby move.  If you have had any abnormal symptoms, such as leaking fluid, bleeding, severe headaches, or abdominal cramping.  If you are using any tobacco products, including cigarettes, chewing tobacco, and electronic cigarettes.  If you have any questions. Other tests that may be performed during your second trimester include:  Blood tests that check for: ? Low iron levels (anemia). ? High blood sugar that affects pregnant women (gestational diabetes) between 24 and 28 weeks. ? Rh antibodies. This is to check for a protein on red blood cells (Rh factor).  Urine tests to check for infections, diabetes, or protein in the   urine.  An ultrasound to confirm the proper growth and development of the baby.  An amniocentesis to check for possible genetic problems.  Fetal screens for spina bifida and Down syndrome.  HIV (human immunodeficiency virus) testing. Routine prenatal testing includes screening for HIV, unless you choose not to have this test. Follow these instructions at home: Medicines  Follow your health care provider's instructions regarding medicine use. Specific medicines may be  either safe or unsafe to take during pregnancy.  Take a prenatal vitamin that contains at least 600 micrograms (mcg) of folic acid.  If you develop constipation, try taking a stool softener if your health care provider approves. Eating and drinking   Eat a balanced diet that includes fresh fruits and vegetables, whole grains, good sources of protein such as meat, eggs, or tofu, and low-fat dairy. Your health care provider will help you determine the amount of weight gain that is right for you.  Avoid raw meat and uncooked cheese. These carry germs that can cause birth defects in the baby.  If you have low calcium intake from food, talk to your health care provider about whether you should take a daily calcium supplement.  Limit foods that are high in fat and processed sugars, such as fried and sweet foods.  To prevent constipation: ? Drink enough fluid to keep your urine clear or pale yellow. ? Eat foods that are high in fiber, such as fresh fruits and vegetables, whole grains, and beans. Activity  Exercise only as directed by your health care provider. Most women can continue their usual exercise routine during pregnancy. Try to exercise for 30 minutes at least 5 days a week. Stop exercising if you experience uterine contractions.  Avoid heavy lifting, wear low heel shoes, and practice good posture.  A sexual relationship may be continued unless your health care provider directs you otherwise. Relieving pain and discomfort  Wear a good support bra to prevent discomfort from breast tenderness.  Take warm sitz baths to soothe any pain or discomfort caused by hemorrhoids. Use hemorrhoid cream if your health care provider approves.  Rest with your legs elevated if you have leg cramps or low back pain.  If you develop varicose veins, wear support hose. Elevate your feet for 15 minutes, 3-4 times a day. Limit salt in your diet. Prenatal Care  Write down your questions. Take them to  your prenatal visits.  Keep all your prenatal visits as told by your health care provider. This is important. Safety  Wear your seat belt at all times when driving.  Make a list of emergency phone numbers, including numbers for family, friends, the hospital, and police and fire departments. General instructions  Ask your health care provider for a referral to a local prenatal education class. Begin classes no later than the beginning of month 6 of your pregnancy.  Ask for help if you have counseling or nutritional needs during pregnancy. Your health care provider can offer advice or refer you to specialists for help with various needs.  Do not use hot tubs, steam rooms, or saunas.  Do not douche or use tampons or scented sanitary pads.  Do not cross your legs for long periods of time.  Avoid cat litter boxes and soil used by cats. These carry germs that can cause birth defects in the baby and possibly loss of the fetus by miscarriage or stillbirth.  Avoid all smoking, herbs, alcohol, and unprescribed drugs. Chemicals in these products can affect the formation   formation and growth of the baby.  Do not use any products that contain nicotine or tobacco, such as cigarettes and e-cigarettes. If you need help quitting, ask your health care provider.  Visit your dentist if you have not gone yet during your pregnancy. Use a soft toothbrush to brush your teeth and be gentle when you floss. Contact a health care provider if:  You have dizziness.  You have mild pelvic cramps, pelvic pressure, or nagging pain in the abdominal area.  You have persistent nausea, vomiting, or diarrhea.  You have a bad smelling vaginal discharge.  You have pain when you urinate. Get help right away if:  You have a fever.  You are leaking fluid from your vagina.  You have spotting or bleeding from your vagina.  You have severe abdominal cramping or pain.  You have rapid weight gain or weight loss.  You  have shortness of breath with chest pain.  You notice sudden or extreme swelling of your face, hands, ankles, feet, or legs.  You have not felt your baby move in over an hour.  You have severe headaches that do not go away when you take medicine.  You have vision changes. Summary  The second trimester is from week 14 through week 27 (months 4 through 6). It is also a time when the fetus is growing rapidly.  Your body goes through many changes during pregnancy. The changes vary from woman to woman.  Avoid all smoking, herbs, alcohol, and unprescribed drugs. These chemicals affect the formation and growth your baby.  Do not use any tobacco products, such as cigarettes, chewing tobacco, and e-cigarettes. If you need help quitting, ask your health care provider.  Contact your health care provider if you have any questions. Keep all prenatal visits as told by your health care provider. This is important. This information is not intended to replace advice given to you by your health care provider. Make sure you discuss any questions you have with your health care provider. Document Revised: 10/08/2018 Document Reviewed: 07/22/2016 [employer] Patient Education  2020 [employer]   Breastfeeding  Choosing to breastfeed is one of the best decisions you can make for yourself and your baby. A change in hormones during pregnancy causes your breasts to make breast milk in your milk-producing glands. Hormones prevent breast milk from being released before your baby is born. They also prompt milk flow after birth. Once breastfeeding has begun, thoughts of your baby, as well as his or her sucking or crying, can stimulate the release of milk from your milk-producing glands. Benefits of breastfeeding Research shows that breastfeeding offers many health benefits for infants and mothers. It also offers a cost-free and convenient way to feed your baby. For your baby  Your first milk (colostrum) helps your  baby's digestive system to function better.  Special cells in your milk (antibodies) help your baby to fight off infections.  Breastfed babies are less likely to develop asthma, allergies, obesity, or type 2 diabetes. They are also at lower risk for sudden infant death syndrome (SIDS).  Nutrients in breast milk are better able to meet your baby's needs compared to infant formula.  Breast milk improves your baby's brain development. For you  Breastfeeding helps to create a very special bond between you and your baby.  Breastfeeding is convenient. Breast milk costs nothing and is always available at the correct temperature.  Breastfeeding helps to burn calories. It helps you to lose the weight that you   gained during pregnancy.  Breastfeeding makes your uterus return faster to its size before pregnancy. It also slows bleeding (lochia) after you give birth.  Breastfeeding helps to lower your risk of developing type 2 diabetes, osteoporosis, rheumatoid arthritis, cardiovascular disease, and breast, ovarian, uterine, and endometrial cancer later in life. Breastfeeding basics Starting breastfeeding  Find a comfortable place to sit or lie down, with your neck and back well-supported.  Place a pillow or a rolled-up blanket under your baby to bring him or her to the level of your breast (if you are seated). Nursing pillows are specially designed to help support your arms and your baby while you breastfeed.  Make sure that your baby's tummy (abdomen) is facing your abdomen.  Gently massage your breast. With your fingertips, massage from the outer edges of your breast inward toward the nipple. This encourages milk flow. If your milk flows slowly, you may need to continue this action during the feeding.  Support your breast with 4 fingers underneath and your thumb above your nipple (make the letter "C" with your hand). Make sure your fingers are well away from your nipple and your baby's  mouth.  Stroke your baby's lips gently with your finger or nipple.  When your baby's mouth is open wide enough, quickly bring your baby to your breast, placing your entire nipple and as much of the areola as possible into your baby's mouth. The areola is the colored area around your nipple. ? More areola should be visible above your baby's upper lip than below the lower lip. ? Your baby's lips should be opened and extended outward (flanged) to ensure an adequate, comfortable latch. ? Your baby's tongue should be between his or her lower gum and your breast.  Make sure that your baby's mouth is correctly positioned around your nipple (latched). Your baby's lips should create a seal on your breast and be turned out (everted).  It is common for your baby to suck about 2-3 minutes in order to start the flow of breast milk. Latching Teaching your baby how to latch onto your breast properly is very important. An improper latch can cause nipple pain, decreased milk supply, and poor weight gain in your baby. Also, if your baby is not latched onto your nipple properly, he or she may swallow some air during feeding. This can make your baby fussy. Burping your baby when you switch breasts during the feeding can help to get rid of the air. However, teaching your baby to latch on properly is still the best way to prevent fussiness from swallowing air while breastfeeding. Signs that your baby has successfully latched onto your nipple  Silent tugging or silent sucking, without causing you pain. Infant's lips should be extended outward (flanged).  Swallowing heard between every 3-4 sucks once your milk has started to flow (after your let-down milk reflex occurs).  Muscle movement above and in front of his or her ears while sucking. Signs that your baby has not successfully latched onto your nipple  Sucking sounds or smacking sounds from your baby while breastfeeding.  Nipple pain. If you think your baby  has not latched on correctly, slip your finger into the corner of your baby's mouth to break the suction and place it between your baby's gums. Attempt to start breastfeeding again. Signs of successful breastfeeding Signs from your baby  Your baby will gradually decrease the number of sucks or will completely stop sucking.  Your baby will fall asleep.  Your   baby's body will relax.  Your baby will retain a small amount of milk in his or her mouth.  Your baby will let go of your breast by himself or herself. Signs from you  Breasts that have increased in firmness, weight, and size 1-3 hours after feeding.  Breasts that are softer immediately after breastfeeding.  Increased milk volume, as well as a change in milk consistency and color by the fifth day of breastfeeding.  Nipples that are not sore, cracked, or bleeding. Signs that your baby is getting enough milk  Wetting at least 1-2 diapers during the first 24 hours after birth.  Wetting at least 5-6 diapers every 24 hours for the first week after birth. The urine should be clear or pale yellow by the age of 5 days.  Wetting 6-8 diapers every 24 hours as your baby continues to grow and develop.  At least 3 stools in a 24-hour period by the age of 5 days. The stool should be soft and yellow.  At least 3 stools in a 24-hour period by the age of 7 days. The stool should be seedy and yellow.  No loss of weight greater than 10% of birth weight during the first 3 days of life.  Average weight gain of 4-7 oz (113-198 g) per week after the age of 4 days.  Consistent daily weight gain by the age of 5 days, without weight loss after the age of 2 weeks. After a feeding, your baby may spit up a small amount of milk. This is normal. Breastfeeding frequency and duration Frequent feeding will help you make more milk and can prevent sore nipples and extremely full breasts (breast engorgement). Breastfeed when you feel the need to reduce the  fullness of your breasts or when your baby shows signs of hunger. This is called "breastfeeding on demand." Signs that your baby is hungry include:  Increased alertness, activity, or restlessness.  Movement of the head from side to side.  Opening of the mouth when the corner of the mouth or cheek is stroked (rooting).  Increased sucking sounds, smacking lips, cooing, sighing, or squeaking.  Hand-to-mouth movements and sucking on fingers or hands.  Fussing or crying. Avoid introducing a pacifier to your baby in the first 4-6 weeks after your baby is born. After this time, you may choose to use a pacifier. Research has shown that pacifier use during the first year of a baby's life decreases the risk of sudden infant death syndrome (SIDS). Allow your baby to feed on each breast as long as he or she wants. When your baby unlatches or falls asleep while feeding from the first breast, offer the second breast. Because newborns are often sleepy in the first few weeks of life, you may need to awaken your baby to get him or her to feed. Breastfeeding times will vary from baby to baby. However, the following rules can serve as a guide to help you make sure that your baby is properly fed:  Newborns (babies 4 weeks of age or younger) may breastfeed every 1-3 hours.  Newborns should not go without breastfeeding for longer than 3 hours during the day or 5 hours during the night.  You should breastfeed your baby a minimum of 8 times in a 24-hour period. Breast milk pumping     Pumping and storing breast milk allows you to make sure that your baby is exclusively fed your breast milk, even at times when you are unable to breastfeed. This   is especially important if you go back to work while you are still breastfeeding, or if you are not able to be present during feedings. Your lactation consultant can help you find a method of pumping that works best for you and give you guidelines about how long it is safe  to store breast milk. Caring for your breasts while you breastfeed Nipples can become dry, cracked, and sore while breastfeeding. The following recommendations can help keep your breasts moisturized and healthy:  Avoid using soap on your nipples.  Wear a supportive bra designed especially for nursing. Avoid wearing underwire-style bras or extremely tight bras (sports bras).  Air-dry your nipples for 3-4 minutes after each feeding.  Use only cotton bra pads to absorb leaked breast milk. Leaking of breast milk between feedings is normal.  Use lanolin on your nipples after breastfeeding. Lanolin helps to maintain your skin's normal moisture barrier. Pure lanolin is not harmful (not toxic) to your baby. You may also hand express a few drops of breast milk and gently massage that milk into your nipples and allow the milk to air-dry. In the first few weeks after giving birth, some women experience breast engorgement. Engorgement can make your breasts feel heavy, warm, and tender to the touch. Engorgement peaks within 3-5 days after you give birth. The following recommendations can help to ease engorgement:  Completely empty your breasts while breastfeeding or pumping. You may want to start by applying warm, moist heat (in the shower or with warm, water-soaked hand towels) just before feeding or pumping. This increases circulation and helps the milk flow. If your baby does not completely empty your breasts while breastfeeding, pump any extra milk after he or she is finished.  Apply ice packs to your breasts immediately after breastfeeding or pumping, unless this is too uncomfortable for you. To do this: ? Put ice in a plastic bag. ? Place a towel between your skin and the bag. ? Leave the ice on for 20 minutes, 2-3 times a day.  Make sure that your baby is latched on and positioned properly while breastfeeding. If engorgement persists after 48 hours of following these recommendations, contact your  health care provider or a lactation consultant. Overall health care recommendations while breastfeeding  Eat 3 healthy meals and 3 snacks every day. Well-nourished mothers who are breastfeeding need an additional 450-500 calories a day. You can meet this requirement by increasing the amount of a balanced diet that you eat.  Drink enough water to keep your urine pale yellow or clear.  Rest often, relax, and continue to take your prenatal vitamins to prevent fatigue, stress, and low vitamin and mineral levels in your body (nutrient deficiencies).  Do not use any products that contain nicotine or tobacco, such as cigarettes and e-cigarettes. Your baby may be harmed by chemicals from cigarettes that pass into breast milk and exposure to secondhand smoke. If you need help quitting, ask your health care provider.  Avoid alcohol.  Do not use illegal drugs or marijuana.  Talk with your health care provider before taking any medicines. These include over-the-counter and prescription medicines as well as vitamins and herbal supplements. Some medicines that may be harmful to your baby can pass through breast milk.  It is possible to become pregnant while breastfeeding. If birth control is desired, ask your health care provider about options that will be safe while breastfeeding your baby. Where to find more information: [employer]: www.llli.org Contact a health care provider   You feel like you want to stop breastfeeding or have become frustrated with breastfeeding.  Your nipples are cracked or bleeding.  Your breasts are red, tender, or warm.  You have: ? Painful breasts or nipples. ? A swollen area on either breast. ? A fever or chills. ? Nausea or vomiting. ? Drainage other than breast milk from your nipples.  Your breasts do not become full before feedings by the fifth day after you give birth.  You feel sad and depressed.  Your baby is: ? Too sleepy to eat  well. ? Having trouble sleeping. ? More than 50 week old and wetting fewer than 6 diapers in a 24-hour period. ? Not gaining weight by 57 days of age.  Your baby has fewer than 3 stools in a 24-hour period.  Your baby's skin or the white parts of his or her eyes become yellow. Get help right away if:  Your baby is overly tired (lethargic) and does not want to wake up and feed.  Your baby develops an unexplained fever. Summary  Breastfeeding offers many health benefits for infant and mothers.  Try to breastfeed your infant when he or she shows early signs of hunger.  Gently tickle or stroke your baby's lips with your finger or nipple to allow the baby to open his or her mouth. Bring the baby to your breast. Make sure that much of the areola is in your baby's mouth. Offer one side and burp the baby before you offer the other side.  Talk with your health care provider or lactation consultant if you have questions or you face problems as you breastfeed. This information is not intended to replace advice given to you by your health care provider. Make sure you discuss any questions you have with your health care provider. Document Revised: 09/10/2017 Document Reviewed: 07/18/2016 [employer] Patient Education  2020 [employer]    Perinatal Depression When a woman feels excessive sadness, anger, or anxiety during pregnancy or during the first 12 months after she gives birth, she has a condition called perinatal depression. Depression can interfere with work, school, relationships, and other everyday activities. If it is not managed properly, it can also cause problems in the mother and her baby. Sometimes, perinatal depression is left untreated because symptoms are thought to be normal mood swings during and right after pregnancy. If you have symptoms of depression, it is important to talk with your health care provider. What are the causes? The exact cause of this condition is not known.  Hormonal changes during and after pregnancy may play a role in causing perinatal depression. What increases the risk? You are more likely to develop this condition if:  You have a personal or family history of depression, anxiety, or mood disorders.  You experience a stressful life event during pregnancy, such as the death of a loved one.  You have a lot of regular life stress.  You do not have support from family members or loved ones, or you are in an abusive relationship. What are the signs or symptoms? Symptoms of this condition include:  Feeling sad or hopeless.  Feelings of guilt.  Feeling irritable or overwhelmed.  Changes in your appetite.  Lack of energy or motivation.  Sleep problems.  Difficulty concentrating or completing tasks.  Loss of interest in hobbies or relationships.  Headaches or stomach problems that do not go away. How is this diagnosed? This condition is diagnosed based on a physical exam and mental evaluation. In  some cases, your health care provider may use a depression screening tool. These tools include a list of questions that can help a health care provider diagnose depression. Your health care provider may refer you to a mental health expert who specializes in depression. How is this treated? This condition may be treated with:  Medicines. Your health care provider will only give you medicines that have been proven safe for pregnancy and breastfeeding.  Talk therapy with a mental health professional to help change your patterns of thinking (cognitive behavioral therapy).  Support groups.  Brain stimulation or light therapies.  Stress reduction therapies, such as mindfulness. Follow these instructions at home: Lifestyle  Do not use any products that contain nicotine or tobacco, such as cigarettes and e-cigarettes. If you need help quitting, ask your health care provider.  Do not use alcohol when you are pregnant. After your baby is born,  limit alcohol intake to no more than 1 drink a day. One drink equals 12 oz of beer, 5 oz of wine, or 1 oz of hard liquor.  Consider joining a support group for new mothers. Ask your health care provider for recommendations.  Take good care of yourself. Make sure you: ? Get plenty of sleep. If you are having trouble sleeping, talk with your health care provider. ? Eat a healthy diet. This includes plenty of fruits and vegetables, whole grains, and lean proteins. ? Exercise regularly, as told by your health care provider. Ask your health care provider what exercises are safe for you. General instructions  Take over-the-counter and prescription medicines only as told by your health care provider.  Talk with your partner or family members about your feelings during pregnancy. Share any concerns or anxieties that you may have.  Ask for help with tasks or chores when you need it. Ask friends and family members to provide meals, watch your children, or help with cleaning.  Keep all follow-up visits as told by your health care provider. This is important. Contact a health care provider if:  You (or people close to you) notice that you have any symptoms of depression.  You have depression and your symptoms get worse.  You experience side effects from medicines, such as nausea or sleep problems. Get help right away if:  You feel like hurting yourself, your baby, or someone else. If you ever feel like you may hurt yourself or others, or have thoughts about taking your own life, get help right away. You can go to your nearest emergency department or call:  Your local emergency services (911 in the [place]  A suicide crisis helpline, such as the National Suicide Prevention Lifeline at (413)077-7432. This is open 24 hours a day. Summary  Perinatal depression is when a woman feels excessive sadness, anger, or anxiety during pregnancy or during the first 12 months after she gives birth.  If  perinatal depression is not treated, it can lead to health problems for the mother and her baby.  This condition is treated with medicines, talk therapy, stress reduction therapies, or a combination of two or more treatments.  Talk with your partner or family members about your feelings. Do not be afraid to ask for help. This information is not intended to replace advice given to you by your health care provider. Make sure you discuss any questions you have with your health care provider. Document Revised: 12/01/2018 Document Reviewed: 08/13/2016 Elsevier Patient Education  2020 [employer].

## 2020-03-21 NOTE — Progress Notes (Signed)
ROB: She is noting some low back and hip pain.  Recently started physical therapy. Still noting persistent nausea and vomiting but has gained 3 lbs since last visit. Still using Zofran, not using scopolamine patches as they make her "feel funny". Discussed adding Reglan to regimen.  Patient desires to discuss her depression symptoms, notes that it has gotten worse during the pregnancy. PHQ-9 = 25. Has not been on anything recently. Discussed counseling and/or resumption of medication (previously on Wellbutrin for PP depression in last pregnancy). Patient declines counseling at this time, would like to restart medication. Will try Zoloft as patient already with poor appetite and Wellbutrin can lead to appetite suppression. Normal anatomy scan. Will f/u at next visit. Breastfeeding discussed.  RTC in 4 weeks.    The following were addressed during this visit:  Breastfeeding Education - Early initiation of breastfeeding    Comments: Keeps milk supply adequate, helps contract uterus and slow bleeding, and early milk is the perfect first food and is easy to digest.   - The importance of exclusive breastfeeding    Comments: Provides antibodies, Lower risk of breast and ovarian cancers, and type-2 diabetes,Helps your body recover, Reduced chance of SIDS.   - Risks of giving your baby anything other than breast milk if you are breastfeeding    Comments: Make the baby less content with breastfeeds, may make my baby more susceptible to illness, and may reduce my milk supply.

## 2020-03-21 NOTE — Progress Notes (Signed)
ROB-pt present for routine prenatal care and ultrasound. Pt c/o of lower back pain and hip/groin pains and n/v and abd pain. PHQ-9=25.

## 2020-03-27 ENCOUNTER — Ambulatory Visit: Payer: BC Managed Care – PPO | Admitting: Physical Therapy

## 2020-03-27 ENCOUNTER — Other Ambulatory Visit: Payer: Self-pay

## 2020-04-02 ENCOUNTER — Inpatient Hospital Stay: Admission: RE | Admit: 2020-04-02 | Payer: BC Managed Care – PPO | Source: Ambulatory Visit

## 2020-04-03 ENCOUNTER — Ambulatory Visit: Payer: BC Managed Care – PPO | Attending: Obstetrics and Gynecology | Admitting: Physical Therapy

## 2020-04-03 ENCOUNTER — Ambulatory Visit: Payer: BC Managed Care – PPO | Admitting: Physical Therapy

## 2020-04-03 DIAGNOSIS — R102 Pelvic and perineal pain: Secondary | ICD-10-CM | POA: Insufficient documentation

## 2020-04-03 DIAGNOSIS — M6281 Muscle weakness (generalized): Secondary | ICD-10-CM | POA: Insufficient documentation

## 2020-04-03 DIAGNOSIS — R278 Other lack of coordination: Secondary | ICD-10-CM | POA: Insufficient documentation

## 2020-04-05 ENCOUNTER — Other Ambulatory Visit: Payer: Self-pay

## 2020-04-05 ENCOUNTER — Ambulatory Visit: Payer: BC Managed Care – PPO | Admitting: Physical Therapy

## 2020-04-05 ENCOUNTER — Encounter: Payer: Self-pay | Admitting: Physical Therapy

## 2020-04-05 DIAGNOSIS — R278 Other lack of coordination: Secondary | ICD-10-CM | POA: Diagnosis present

## 2020-04-05 DIAGNOSIS — M6281 Muscle weakness (generalized): Secondary | ICD-10-CM | POA: Diagnosis present

## 2020-04-05 DIAGNOSIS — R102 Pelvic and perineal pain: Secondary | ICD-10-CM | POA: Diagnosis not present

## 2020-04-05 NOTE — Therapy (Signed)
[place] [facility] [address] [place], [place], [address] Phone: [phone]   Fax:  [phone]  Physical Therapy Treatment  Patient Details  Name: Ashley Strickland MRN: [id number] Date of Birth: 1995-08-23 Referring Provider (PT): Hildred Laser   Encounter Date: 04/05/2020   PT End of Session - 04/05/20 1410    Visit Number 5    Number of Visits 12    Date for PT Re-Evaluation 05/16/20    Authorization Type IE 02/21/2020    PT Start Time 1402    PT Stop Time 1458    PT Time Calculation (min) 56 min    Activity Tolerance Patient limited by fatigue;Other (comment)   Treatment limited secondary to nausea   Behavior During Therapy WFL for tasks assessed/performed           Past Medical History:  Diagnosis Date   Anemia    Anxiety    Arrhythmia    Concussion    Constipation    Depression    Dermatitis    Joint pain    Orthostatic hypotension    Personal history of other mental and behavioral disorders    POTS (postural orthostatic tachycardia syndrome)    Pott's disease    Vitamin D deficiency     Past Surgical History:  Procedure Laterality Date   ADENOIDECTOMY      There were no vitals filed for this visit.   Subjective Assessment - 04/05/20 1405    Subjective Patient states that she is hurting today. She reports that she missed last week's appointment 2/2 to illness. Patient notes that she is having a lot of lower abdominal pain and back pain which is not helped by the maternity belly band. Patient gets some releif from all fours positioning.    Currently in Pain? Yes    Pain Score 8     Pain Location Pelvis           TREATMENT Manual Therapy: STM and TPR performed to B thoracic and lumbar paraspinals to allow for decreased tension and pain and improved posture and function CPA/UPA of thoracolumbar junction in supported forward flexion in sitting for improved joint mobility  Neuromuscular  Re-education: Patient education on role of uterus and PFM in delivery.  Supported (seated) child's pose diaphragmatic breathing for improved PFM coordination and lengthening Supported (seated) child's pose PFM lengthening with coordinated breath for improved PFM relaxation Patient education on thoracolumbar mobility exercises: sidelying open book, sidelying bow and arrow, sidelying thoracic extension-rotation, and thread the needle for decreased back pain.   Patient educated throughout session on appropriate technique and form using multi-modal cueing, HEP, and activity modification. Patient articulated understanding and returned demonstration.  Patient Response to interventions: Comfortable to return when feeling better.  ASSESSMENT Patient presents to clinic with good motivation to participate in therapy despite feeling a bit fatigued from increased nausea and emesis yesterday. Patient demonstrates deficits in pain, posture, strength, body mechanics, joint stability/balance. Patient had significant pain reduction with manual interventions and responded positively to active and educational interventions. Patient will benefit from continued skilled therapeutic intervention to address remaining deficits in pain, posture, strength, body mechanics, joint stability/balance in order to increase function, and improve overall QOL.     PT Long Term Goals - 02/22/20 7510      PT LONG TERM GOAL #1   Title Patient will demonstrate independence with HEP in order to maximize therapeutic gains and improve carryover from physical therapy sessions to ADLs  in the home and community.    Baseline IE: not provided    Time 12    Period Weeks    Status New    Target Date 05/16/20      PT LONG TERM GOAL #2   Title Patient will demonstrate improved function as evidenced by a score of 62 on FOTO measure for full participation in activities at home and in the community.    Baseline IE: Bowel Constipation 52     Time 12    Period Weeks    Status New    Target Date 05/16/20      PT LONG TERM GOAL #3   Title Patient will decrease worst pain as reported on NPRS by at least 2 points to demonstrate clinically significant reduction in pain in order to restore/improve function and overall QOL.    Baseline IE: 10/10 (pubic symphysis)    Time 12    Period Weeks    Status New    Target Date 05/16/20      PT LONG TERM GOAL #4   Title Patient will report "moderate difficulty" with participation in household chores and physical activities on PFDI Pain measure for improved independence and function at home and in the community.    Baseline IE: 'extreme difficulty'    Time 12    Period Weeks    Status New    Target Date 05/16/20                 Plan - 04/05/20 1410    Clinical Impression Statement Patient presents to clinic with good motivation to participate in therapy despite feeling a bit fatigued from increased nausea and emesis yesterday. Patient demonstrates deficits in pain, posture, strength, body mechanics, joint stability/balance. Patient had significant pain reduction with manual interventions and responded positively to active and educational interventions. Patient will benefit from continued skilled therapeutic intervention to address remaining deficits in pain, posture, strength, body mechanics, joint stability/balance in order to increase function, and improve overall QOL.    Personal Factors and Comorbidities Age;Education;Behavior Pattern;Fitness;Past/Current Experience;Comorbidity 3+;Time since onset of injury/illness/exacerbation    Comorbidities anxiety, depression, POTS, arrythmia, joint pain, dermatitis, personal hx of mental and behavioral disorders    Examination-Activity Limitations Dressing;Sit;Transfers;Bed Mobility;Lift;Squat;Bend;Caring for Others;Continence;Stairs;Stand;Locomotion Level;Sleep    Examination-Participation Restrictions Interpersonal  Relationship;Occupation;Laundry;Cleaning;Yard Work;Meal Prep    Stability/Clinical Decision Making Evolving/Moderate complexity    Rehab Potential Fair    PT Frequency 1x / week    PT Duration 12 weeks    PT Treatment/Interventions ADLs/Self Care Home Management;Aquatic Therapy;Moist Heat;Cryotherapy;Gait training;Stair training;Functional mobility training;Therapeutic activities;Neuromuscular re-education;Balance training;Therapeutic exercise;Patient/family education;Taping;Dry needling;Passive range of motion;Manual techniques;Compression bandaging    PT Next Visit Plan BLE strengthening; donning/doffing maternity brace    PT Home Exercise Plan none provided at this time    Consulted and Agree with Plan of Care Patient           Patient will benefit from skilled therapeutic intervention in order to improve the following deficits and impairments:  Abnormal gait, Decreased balance, Decreased endurance, Difficulty walking, Obesity, Pain, Postural dysfunction, Improper body mechanics, Hypermobility, Decreased strength, Decreased coordination, Decreased activity tolerance  Visit Diagnosis: Pelvic pain  Muscle weakness (generalized)  Other lack of coordination     Problem List Patient Active Problem List   Diagnosis Date Noted   History of postpartum depression 01/19/2020   Vitamin D deficiency 12/05/2019   Gastroesophageal reflux disease without esophagitis 11/23/2019   Eczema 10/12/2019   Dysautonomia (HCC) 10/12/2019  Anxiety and depression 09/23/2018   POTS (postural orthostatic tachycardia syndrome) 02/26/2018   Class 3 severe obesity with body mass index (BMI) of 45.0 to 49.9 in adult [facility]) 02/26/2018   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 [place], [place], [address] Phone: [phone]   Fax:  [phone]  Name: Ashley Strickland MRN:  [id number] Date of Birth: Nov 26, 1995

## 2020-04-10 ENCOUNTER — Ambulatory Visit: Payer: BC Managed Care – PPO | Admitting: Physical Therapy

## 2020-04-17 ENCOUNTER — Encounter: Payer: Self-pay | Admitting: Physical Therapy

## 2020-04-17 ENCOUNTER — Ambulatory Visit: Payer: BC Managed Care – PPO | Admitting: Physical Therapy

## 2020-04-17 ENCOUNTER — Other Ambulatory Visit: Payer: Self-pay

## 2020-04-17 DIAGNOSIS — R278 Other lack of coordination: Secondary | ICD-10-CM

## 2020-04-17 DIAGNOSIS — R102 Pelvic and perineal pain: Secondary | ICD-10-CM

## 2020-04-17 DIAGNOSIS — M6281 Muscle weakness (generalized): Secondary | ICD-10-CM

## 2020-04-17 NOTE — Therapy (Signed)
[facility] [address] [place], [place], [address] Phone: [phone]   Fax:  [phone]  Physical Therapy Treatment  Patient Details  Name: Thersia Petraglia MRN: [id number] Date of Birth: 04-05-1996 Referring Provider (PT): Hildred Laser   Encounter Date: 04/17/2020   PT End of Session - 04/17/20 1407    Visit Number 6    Number of Visits 12    Date for PT Re-Evaluation 05/16/20    Authorization Type IE 02/21/2020    PT Start Time 1400    PT Stop Time 1455    PT Time Calculation (min) 55 min    Activity Tolerance Patient limited by fatigue;Other (comment)   Treatment limited secondary to nausea   Behavior During Therapy [facility] for tasks assessed/performed           Past Medical History:  Diagnosis Date  . Anemia   . Anxiety   . Arrhythmia   . Concussion   . Constipation   . Depression   . Dermatitis   . Joint pain   . Orthostatic hypotension   . Personal history of other mental and behavioral disorders   . POTS (postural orthostatic tachycardia syndrome)   . Pott's disease   . Vitamin D deficiency     Past Surgical History:  Procedure Laterality Date  . ADENOIDECTOMY      There were no vitals filed for this visit.   Subjective Assessment - 04/17/20 1404    Subjective Patient notes she hurts really bad today. 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 Patient has pain across her lumbothoracic junction wrapping around fromthe back. Patient did not wear belly band on Saturday and is not wearing it currently because she intends to go home and go to bed.    Currently in Pain? Yes    Pain Score 8     Pain Location Generalized    Pain Orientation Lower;Mid    Pain Descriptors / Indicators Aching           TREATMENT Manual Therapy: STM and TPR performed to B thoracic and lumbar paraspinals to allow for decreased tension and pain and improved posture and  function CPA/UPA of thoracolumbar junction in supported forward flexion in sitting for improved joint mobility  Neuromuscular Re-education: Supported (seated) child's pose diaphragmatic breathing for improved PFM coordination and lengthening Supported (seated) child's pose PFM lengthening with coordinated breath for improved PFM relaxation Physioball mobility series for decreased pain and improved posture:  Seated TrA activation  Lumbar flexion stretch with TrA activation  Lumbar flexion and rotation, B with TrA activation Seated postural awareness with chin tuck and gentle scap retraction for decreased pain and improved postural control   Patient educated throughout session on appropriate technique and form using multi-modal cueing, HEP, and activity modification. Patient articulated understanding and returned demonstration.  Patient Response to interventions: 4/10 pain at end of session  ASSESSMENT Patient presents to clinic with good motivation to participate in therapy despite feeling a bit fatigued from increased nausea and emesis yesterday. Patient demonstrates deficits in pain, posture, strength, body mechanics, joint stability/balance. Patient had significant pain reduction with manual interventions and responded positively to postural interventions. Patient will benefit from continued skilled therapeutic intervention to address remaining deficits in pain, posture, strength, body mechanics, joint stability/balance in order to increase function, and improve overall QOL.    PT Long Term Goals - 02/22/20 3762  PT LONG TERM GOAL #1   Title Patient will demonstrate independence with HEP in order to maximize therapeutic gains and improve carryover from physical therapy sessions to ADLs in the home and community.    Baseline IE: not provided    Time 12    Period Weeks    Status New    Target Date 05/16/20      PT LONG TERM GOAL #2   Title Patient will demonstrate improved  function as evidenced by a score of 62 on FOTO measure for full participation in activities at home and in the community.    Baseline IE: Bowel Constipation 52    Time 12    Period Weeks    Status New    Target Date 05/16/20      PT LONG TERM GOAL #3   Title Patient will decrease worst pain as reported on NPRS by at least 2 points to demonstrate clinically significant reduction in pain in order to restore/improve function and overall QOL.    Baseline IE: 10/10 (pubic symphysis)    Time 12    Period Weeks    Status New    Target Date 05/16/20      PT LONG TERM GOAL #4   Title Patient will report "moderate difficulty" with participation in household chores and physical activities on PFDI Pain measure for improved independence and function at home and in the community.    Baseline IE: 'extreme difficulty'    Time 12    Period Weeks    Status New    Target Date 05/16/20                 Plan - 04/17/20 1408    Clinical Impression Statement Patient presents to clinic with good motivation to participate in therapy despite feeling a bit fatigued from increased nausea and emesis yesterday. Patient demonstrates deficits in pain, posture, strength, body mechanics, joint stability/balance. Patient had significant pain reduction with manual interventions and responded positively to postural interventions. Patient will benefit from continued skilled therapeutic intervention to address remaining deficits in pain, posture, strength, body mechanics, joint stability/balance in order to increase function, and improve overall QOL.    Personal Factors and Comorbidities Age;Education;Behavior Pattern;Fitness;Past/Current Experience;Comorbidity 3+;Time since onset of injury/illness/exacerbation    Comorbidities anxiety, depression, POTS, arrythmia, joint pain, dermatitis, personal hx of mental and behavioral disorders    Examination-Activity Limitations Dressing;Sit;Transfers;Bed  Mobility;Lift;Squat;Bend;Caring for Others;Continence;Stairs;Stand;Locomotion Level;Sleep    Examination-Participation Restrictions Interpersonal Relationship;Occupation;Laundry;Cleaning;Yard Work;Meal Prep    Stability/Clinical Decision Making Evolving/Moderate complexity    Rehab Potential Fair    PT Frequency 1x / week    PT Duration 12 weeks    PT Treatment/Interventions ADLs/Self Care Home Management;Aquatic Therapy;Moist Heat;Cryotherapy;Gait training;Stair training;Functional mobility training;Therapeutic activities;Neuromuscular re-education;Balance training;Therapeutic exercise;Patient/family education;Taping;Dry needling;Passive range of motion;Manual techniques;Compression bandaging    PT Next Visit Plan BLE strengthening; donning/doffing maternity brace    PT Home Exercise Plan none provided at this time    Consulted and Agree with Plan of Care Patient           Patient will benefit from skilled therapeutic intervention in order to improve the following deficits and impairments:  Abnormal gait, Decreased balance, Decreased endurance, Difficulty walking, Obesity, Pain, Postural dysfunction, Improper body mechanics, Hypermobility, Decreased strength, Decreased coordination, Decreased activity tolerance  Visit Diagnosis: Pelvic pain  Muscle weakness (generalized)  Other lack of coordination     Problem List Patient Active Problem List   Diagnosis Date Noted  . History of postpartum  depression 01/19/2020  . Vitamin D deficiency 12/05/2019  . Gastroesophageal reflux disease without esophagitis 11/23/2019  . Eczema 10/12/2019  . Dysautonomia (HCC) 10/12/2019  . Anxiety and depression 09/23/2018  . POTS (postural orthostatic tachycardia syndrome) 02/26/2018  . Class 3 severe obesity with body mass index (BMI) of 45.0 to 49.9 in adult (HCC) 02/26/2018  . 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 [place], [place], [address] Phone: [phone]   Fax:  [phone]  Name: Magnolia Mattila MRN: [id number] Date of Birth: 08/10/1995

## 2020-04-20 ENCOUNTER — Other Ambulatory Visit: Payer: Self-pay

## 2020-04-20 ENCOUNTER — Encounter: Payer: Self-pay | Admitting: Obstetrics and Gynecology

## 2020-04-20 ENCOUNTER — Ambulatory Visit (INDEPENDENT_AMBULATORY_CARE_PROVIDER_SITE_OTHER): Payer: BC Managed Care – PPO | Admitting: Obstetrics and Gynecology

## 2020-04-20 VITALS — BP 122/74 | HR 114 | Wt 254.7 lb

## 2020-04-20 DIAGNOSIS — Z3A26 26 weeks gestation of pregnancy: Secondary | ICD-10-CM | POA: Diagnosis not present

## 2020-04-20 DIAGNOSIS — Z3482 Encounter for supervision of other normal pregnancy, second trimester: Secondary | ICD-10-CM

## 2020-04-20 LAB — POCT URINALYSIS DIPSTICK OB
Bilirubin, UA: NEGATIVE
Blood, UA: NEGATIVE
Glucose, UA: NEGATIVE
Ketones, UA: NEGATIVE
Leukocytes, UA: NEGATIVE
Nitrite, UA: NEGATIVE
Spec Grav, UA: 1.01 (ref 1.010–1.025)
Urobilinogen, UA: 0.2 E.U./dL
pH, UA: 6.5 (ref 5.0–8.0)

## 2020-04-24 ENCOUNTER — Ambulatory Visit: Payer: BC Managed Care – PPO | Admitting: Physical Therapy

## 2020-04-25 ENCOUNTER — Encounter: Payer: BC Managed Care – PPO | Admitting: Obstetrics and Gynecology

## 2020-04-26 ENCOUNTER — Telehealth: Payer: Self-pay

## 2020-04-26 NOTE — Telephone Encounter (Signed)
Called patient to see if she would like to reschedule she missed yesterday, was unable to reach patient. LVM for her to call the office if she would like to reschedule.

## 2020-05-01 ENCOUNTER — Ambulatory Visit: Payer: BC Managed Care – PPO | Admitting: Physical Therapy

## 2020-05-04 ENCOUNTER — Encounter: Payer: BC Managed Care – PPO | Admitting: Obstetrics and Gynecology

## 2020-05-08 ENCOUNTER — Ambulatory Visit: Payer: BC Managed Care – PPO | Attending: Obstetrics and Gynecology | Admitting: Physical Therapy

## 2020-05-08 ENCOUNTER — Other Ambulatory Visit: Payer: Self-pay

## 2020-05-08 ENCOUNTER — Encounter: Payer: Self-pay | Admitting: Physical Therapy

## 2020-05-08 DIAGNOSIS — R102 Pelvic and perineal pain: Secondary | ICD-10-CM | POA: Diagnosis present

## 2020-05-08 DIAGNOSIS — M6281 Muscle weakness (generalized): Secondary | ICD-10-CM | POA: Diagnosis present

## 2020-05-08 DIAGNOSIS — R278 Other lack of coordination: Secondary | ICD-10-CM | POA: Diagnosis present

## 2020-05-08 NOTE — Therapy (Signed)
[facility] [address] [place], [place], [address] Phone: [phone]   Fax:  [phone]  Physical Therapy Treatment  Patient Details  Name: Ashley Strickland MRN: [id number] Date of Birth: 1995/09/16 Referring Provider (PT): Hildred Laser   Encounter Date: 05/08/2020   PT End of Session - 05/08/20 1450    Visit Number 7    Number of Visits 12    Date for PT Re-Evaluation 05/16/20    Authorization Type IE 02/21/2020    PT Start Time 1400    PT Stop Time 1455    PT Time Calculation (min) 55 min    Activity Tolerance Patient limited by fatigue    Behavior During Therapy [facility] for tasks assessed/performed           Past Medical History:  Diagnosis Date  . Anemia   . Anxiety   . Arrhythmia   . Concussion   . Constipation   . Depression   . Dermatitis   . Joint pain   . Orthostatic hypotension   . Personal history of other mental and behavioral disorders   . POTS (postural orthostatic tachycardia syndrome)   . Pott's disease   . Vitamin D deficiency     Past Surgical History:  Procedure Laterality Date  . ADENOIDECTOMY      There were no vitals filed for this visit.   Subjective Assessment - 05/08/20 1626    Subjective Patient presents to clinic with increased low back pain. She notes that she has been having Braxton Hix contractions regularly and has pubic symphysis pain during these episodes.    Currently in Pain? Yes    Pain Score 6     Pain Location Back    Pain Orientation Lower           TREATMENT Manual Therapy: STM and TPR performed to B thoracic and lumbar paraspinals to allow for decreased tension and pain and improved posture and function CPA/UPA of thoracolumbar junction in supported forward flexion in sitting for improved joint mobility  Neuromuscular Re-education: Supported (seated) child's pose diaphragmatic breathing for improved PFM coordination and lengthening Supported (seated)  child's pose PFM lengthening with coordinated breath for improved PFM relaxation Physioball postural control series for decreased pain and improved posture:  Seated TrA activation  Pelvic tilts with coordinated breath  Pelvic clocks with coordinated breath Seated postural awareness with chin tuck and gentle scap retraction for decreased pain and improved postural control   Patient educated throughout session on appropriate technique and form using multi-modal cueing, HEP, and activity modification. Patient articulated understanding and returned demonstration.  Patient Response to interventions: 3/10 pain at end of session  ASSESSMENT Patient presents to clinic with motivation to participate in physical therapy. Patient demonstrates deficits in pain, posture, strength, body mechanics, joint stability/balance. Patient had significant pain reduction with manual interventions and deep core training on physioball. Patient will benefit from continued skilled therapeutic intervention to address remaining deficits in pain, posture, strength, body mechanics, joint stability/balance in order to increase function, and improve overall QOL.       PT Long Term Goals - 02/22/20 4332      PT LONG TERM GOAL #1   Title Patient will demonstrate independence with HEP in order to maximize therapeutic gains and improve carryover from physical therapy sessions to ADLs in the home and community.    Baseline IE: not provided    Time 12    Period Weeks    Status  New    Target Date 05/16/20      PT LONG TERM GOAL #2   Title Patient will demonstrate improved function as evidenced by a score of 62 on FOTO measure for full participation in activities at home and in the community.    Baseline IE: Bowel Constipation 52    Time 12    Period Weeks    Status New    Target Date 05/16/20      PT LONG TERM GOAL #3   Title Patient will decrease worst pain as reported on NPRS by at least 2 points to demonstrate  clinically significant reduction in pain in order to restore/improve function and overall QOL.    Baseline IE: 10/10 (pubic symphysis)    Time 12    Period Weeks    Status New    Target Date 05/16/20      PT LONG TERM GOAL #4   Title Patient will report "moderate difficulty" with participation in household chores and physical activities on PFDI Pain measure for improved independence and function at home and in the community.    Baseline IE: 'extreme difficulty'    Time 12    Period Weeks    Status New    Target Date 05/16/20                 Plan - 05/08/20 1628    Clinical Impression Statement Patient presents to clinic with motivation to participate in physical therapy. Patient demonstrates deficits in pain, posture, strength, body mechanics, joint stability/balance. Patient had significant pain reduction with manual interventions and deep core training on physioball. Patient will benefit from continued skilled therapeutic intervention to address remaining deficits in pain, posture, strength, body mechanics, joint stability/balance in order to increase function, and improve overall QOL.    Personal Factors and Comorbidities Age;Education;Behavior Pattern;Fitness;Past/Current Experience;Comorbidity 3+;Time since onset of injury/illness/exacerbation    Comorbidities anxiety, depression, POTS, arrythmia, joint pain, dermatitis, personal hx of mental and behavioral disorders    Examination-Activity Limitations Dressing;Sit;Transfers;Bed Mobility;Lift;Squat;Bend;Caring for Others;Continence;Stairs;Stand;Locomotion Level;Sleep    Examination-Participation Restrictions Interpersonal Relationship;Occupation;Laundry;Cleaning;Yard Work;Meal Prep    Stability/Clinical Decision Making Evolving/Moderate complexity    Rehab Potential Fair    PT Frequency 1x / week    PT Duration 12 weeks    PT Treatment/Interventions ADLs/Self Care Home Management;Aquatic Therapy;Moist Heat;Cryotherapy;Gait  training;Stair training;Functional mobility training;Therapeutic activities;Neuromuscular re-education;Balance training;Therapeutic exercise;Patient/family education;Taping;Dry needling;Passive range of motion;Manual techniques;Compression bandaging    PT Next Visit Plan BLE strengthening; donning/doffing maternity brace    PT Home Exercise Plan none provided at this time    Consulted and Agree with Plan of Care Patient           Patient will benefit from skilled therapeutic intervention in order to improve the following deficits and impairments:  Abnormal gait, Decreased balance, Decreased endurance, Difficulty walking, Obesity, Pain, Postural dysfunction, Improper body mechanics, Hypermobility, Decreased strength, Decreased coordination, Decreased activity tolerance  Visit Diagnosis: Pelvic pain  Muscle weakness (generalized)  Other lack of coordination     Problem List Patient Active Problem List   Diagnosis Date Noted  . History of postpartum depression 01/19/2020  . Vitamin D deficiency 12/05/2019  . Gastroesophageal reflux disease without esophagitis 11/23/2019  . Eczema 10/12/2019  . Dysautonomia (HCC) 10/12/2019  . Anxiety and depression 09/23/2018  . POTS (postural orthostatic tachycardia syndrome) 02/26/2018  . Class 3 severe obesity with body mass index (BMI) of 45.0 to 49.9 in adult (HCC) 02/26/2018  . 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

## 2020-05-09 ENCOUNTER — Other Ambulatory Visit: Payer: BC Managed Care – PPO

## 2020-05-09 ENCOUNTER — Other Ambulatory Visit: Payer: Self-pay | Admitting: Surgical

## 2020-05-09 ENCOUNTER — Encounter: Payer: Self-pay | Admitting: Obstetrics and Gynecology

## 2020-05-09 ENCOUNTER — Ambulatory Visit (INDEPENDENT_AMBULATORY_CARE_PROVIDER_SITE_OTHER): Payer: BC Managed Care – PPO | Admitting: Obstetrics and Gynecology

## 2020-05-09 VITALS — BP 118/73 | HR 114 | Wt 258.7 lb

## 2020-05-09 DIAGNOSIS — Z3A28 28 weeks gestation of pregnancy: Secondary | ICD-10-CM

## 2020-05-09 DIAGNOSIS — Z23 Encounter for immunization: Secondary | ICD-10-CM

## 2020-05-09 DIAGNOSIS — Z3483 Encounter for supervision of other normal pregnancy, third trimester: Secondary | ICD-10-CM | POA: Diagnosis not present

## 2020-05-09 DIAGNOSIS — Z3482 Encounter for supervision of other normal pregnancy, second trimester: Secondary | ICD-10-CM

## 2020-05-09 NOTE — Progress Notes (Signed)
ROB: Patient says nausea and vomiting much improved.  Weight gain noted.  She started her Zoloft and said it made her tired so she discontinued it.  She states that she is going to try to take it at a different time of day.  Has not yet begun new regimen.  1 hour GCT today.  Reports daily fetal movement.

## 2020-05-09 NOTE — Addendum Note (Signed)
Addended by: Dorian Pod on: 05/09/2020 11:32 AM   Modules accepted: Orders

## 2020-05-10 LAB — CBC
Hematocrit: 35.4 % (ref 34.0–46.6)
Hemoglobin: 11.9 g/dL (ref 11.1–15.9)
MCH: 27.2 pg (ref 26.6–33.0)
MCHC: 33.6 g/dL (ref 31.5–35.7)
MCV: 81 fL (ref 79–97)
Platelets: 240 10*3/uL (ref 150–450)
RBC: 4.38 x10E6/uL (ref 3.77–5.28)
RDW: 12.8 % (ref 11.7–15.4)
WBC: 14 10*3/uL — ABNORMAL HIGH (ref 3.4–10.8)

## 2020-05-10 LAB — RPR: RPR Ser Ql: NONREACTIVE

## 2020-05-10 LAB — GLUCOSE, 1 HOUR GESTATIONAL: Gestational Diabetes Screen: 109 mg/dL (ref 65–139)

## 2020-05-15 ENCOUNTER — Ambulatory Visit: Payer: BC Managed Care – PPO | Admitting: Physical Therapy

## 2020-05-15 ENCOUNTER — Encounter: Payer: Self-pay | Admitting: Physical Therapy

## 2020-05-15 ENCOUNTER — Other Ambulatory Visit: Payer: Self-pay

## 2020-05-15 DIAGNOSIS — R102 Pelvic and perineal pain: Secondary | ICD-10-CM | POA: Diagnosis not present

## 2020-05-15 DIAGNOSIS — R278 Other lack of coordination: Secondary | ICD-10-CM

## 2020-05-15 DIAGNOSIS — M6281 Muscle weakness (generalized): Secondary | ICD-10-CM

## 2020-05-15 NOTE — Therapy (Signed)
[facility] [address] [place], [place], [address] Phone: [phone]   Fax:  [phone]  Physical Therapy Treatment  Patient Details  Name: Ashley Strickland MRN: [id number] Date of Birth: 09-17-1995 Referring Provider (PT): Hildred Laser   Encounter Date: 05/15/2020   PT End of Session - 05/15/20 1409    Visit Number 8    Number of Visits 18    Date for PT Re-Evaluation 06/26/20    Authorization Type IE 02/21/2020    PT Start Time 1400    PT Stop Time 1455    PT Time Calculation (min) 55 min    Activity Tolerance Patient tolerated treatment well    Behavior During Therapy [facility] for tasks assessed/performed           Past Medical History:  Diagnosis Date  . Anemia   . Anxiety   . Arrhythmia   . Concussion   . Constipation   . Depression   . Dermatitis   . Joint pain   . Orthostatic hypotension   . Personal history of other mental and behavioral disorders   . POTS (postural orthostatic tachycardia syndrome)   . Pott's disease   . Vitamin D deficiency     Past Surgical History:  Procedure Laterality Date  . ADENOIDECTOMY      There were no vitals filed for this visit.   Subjective Assessment - 05/15/20 1723    Subjective Patient presents to clinic with increased nasuea; she notes that the newly prescribed anti-nasuea medication doesn't seem to work as well. She is having some lateral hip pain and mid/upper back pain that is causing her much more discomfort than her pubic symphysis pain at this time.    Currently in Pain? Yes    Pain Score 7     Pain Location Hip    Pain Orientation Right;Left           TREATMENT Manual Therapy: STM and TPR performed to B thoracic paraspinals and B hip complexes to allow for decreased tension and pain and improved posture and function CPA/UPA of thoracolumbar junction in supported forward flexion in sitting for improved joint mobility  Neuromuscular  Re-education: Supported (seated) child's pose diaphragmatic breathing for improved PFM coordination and lengthening Physioball postural control series for decreased pain and improved posture:  Seated TrA activation  Pelvic tilts with coordinated breath  Pelvic clocks with coordinated breath Seated piriformis stretch, B, for decreased lateral hip pain and discomfort Scap retraction and band pull aparts for improved postural control and awareness   Patient educated throughout session on appropriate technique and form using multi-modal cueing, HEP, and activity modification. Patient articulated understanding and returned demonstration.  Patient Response to interventions: 4/10 pain at end of session  ASSESSMENT Patient presents to clinic with motivation to participate in physical therapy. Patient demonstrates deficits in pain, posture, strength, body mechanics, joint stability/balance. Patient continues to have significant pain reduction with manual interventions and deep core training on physioball. Patient has made significant progress toward/achieved all goals with pain being the main limiting factor that remains. Patient's condition has the potential to improve in response to therapy. Maximum improvement is yet to be obtained. The anticipated improvement is attainable and reasonable in a generally predictable time. Patient will benefit from continued skilled therapeutic intervention to address remaining deficits in pain, posture, strength, body mechanics, joint stability/balance in order to increase function, and improve overall QOL.       PT Long Term Goals -  05/15/20 1416      PT LONG TERM GOAL #1   Title Patient will demonstrate independence with HEP in order to maximize therapeutic gains and improve carryover from physical therapy sessions to ADLs in the home and community.    Baseline IE: not provided; 11/16: IND    Time 6    Period Weeks    Status Achieved    Target Date 06/26/20       PT LONG TERM GOAL #2   Title Patient will demonstrate improved function as evidenced by a score of 62 on FOTO measure for full participation in activities at home and in the community.    Baseline IE: Bowel Constipation 52; 11/16: 60    Time 6    Period Weeks    Status On-going    Target Date 06/26/20      PT LONG TERM GOAL #3   Title Patient will decrease worst pain as reported on NPRS by at least 2 points to demonstrate clinically significant reduction in pain in order to restore/improve function and overall QOL.    Baseline IE: 10/10 (pubic symphysis); 11/16: 3-4/10 (pubic symphysis) 7-8/10 (B lateral hip)    Time 6    Period Weeks    Status On-going    Target Date 06/26/20      PT LONG TERM GOAL #4   Title Patient will report "moderate difficulty" with participation in household chores and physical activities on PFDI Pain measure for improved independence and function at home and in the community.    Baseline IE: 'extreme difficulty'; 11/16: "moderate difficulty"    Time 6    Period Weeks    Status Achieved    Target Date 06/26/20                 Plan - 05/15/20 1415    Clinical Impression Statement Patient presents to clinic with motivation to participate in physical therapy. Patient demonstrates deficits in pain, posture, strength, body mechanics, joint stability/balance. Patient continues to have significant pain reduction with manual interventions and deep core training on physioball. Patient has made significant progress toward/achieved all goals with pain being the main limiting factor that remains. Patient's condition has the potential to improve in response to therapy. Maximum improvement is yet to be obtained. The anticipated improvement is attainable and reasonable in a generally predictable time. Patient will benefit from continued skilled therapeutic intervention to address remaining deficits in pain, posture, strength, body mechanics, joint stability/balance  in order to increase function, and improve overall QOL.    Personal Factors and Comorbidities Age;Education;Behavior Pattern;Fitness;Past/Current Experience;Comorbidity 3+;Time since onset of injury/illness/exacerbation    Comorbidities anxiety, depression, POTS, arrythmia, joint pain, dermatitis, personal hx of mental and behavioral disorders    Examination-Activity Limitations Dressing;Sit;Transfers;Bed Mobility;Lift;Squat;Bend;Caring for Others;Continence;Stairs;Stand;Locomotion Level;Sleep    Examination-Participation Restrictions Interpersonal Relationship;Occupation;Laundry;Cleaning;Yard Work;Meal Prep    Stability/Clinical Decision Making Evolving/Moderate complexity    Rehab Potential Fair    PT Frequency 1x / week    PT Duration 12 weeks    PT Treatment/Interventions ADLs/Self Care Home Management;Aquatic Therapy;Moist Heat;Cryotherapy;Gait training;Stair training;Functional mobility training;Therapeutic activities;Neuromuscular re-education;Balance training;Therapeutic exercise;Patient/family education;Taping;Dry needling;Passive range of motion;Manual techniques;Compression bandaging    PT Next Visit Plan manual PRN; gentle stretches for hips and postural re-ed    PT Home Exercise Plan see handouts    Consulted and Agree with Plan of Care Patient           Patient will benefit from skilled therapeutic intervention in order to improve the following  deficits and impairments:  Abnormal gait, Decreased balance, Decreased endurance, Difficulty walking, Obesity, Pain, Postural dysfunction, Improper body mechanics, Hypermobility, Decreased strength, Decreased coordination, Decreased activity tolerance  Visit Diagnosis: Pelvic pain  Muscle weakness (generalized)  Other lack of coordination     Problem List Patient Active Problem List   Diagnosis Date Noted  . History of postpartum depression 01/19/2020  . Vitamin D deficiency 12/05/2019  . Gastroesophageal reflux disease without  esophagitis 11/23/2019  . Eczema 10/12/2019  . Dysautonomia (HCC) 10/12/2019  . Anxiety and depression 09/23/2018  . POTS (postural orthostatic tachycardia syndrome) 02/26/2018  . Class 3 severe obesity with body mass index (BMI) of 45.0 to 49.9 in adult (HCC) 02/26/2018  . 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 [place], [place], [address] Phone: [phone]   Fax:  [phone]  Name: Ashley Strickland MRN: [id number] Date of Birth: 02-Feb-1996

## 2020-05-19 ENCOUNTER — Observation Stay
Admission: EM | Admit: 2020-05-19 | Discharge: 2020-05-19 | Disposition: A | Discharge status: Home / Self Care | Payer: BC Managed Care – PPO | Attending: Obstetrics and Gynecology | Admitting: Obstetrics and Gynecology

## 2020-05-19 ENCOUNTER — Encounter: Payer: Self-pay | Admitting: Emergency Medicine

## 2020-05-19 ENCOUNTER — Emergency Department: Payer: BC Managed Care – PPO

## 2020-05-19 ENCOUNTER — Other Ambulatory Visit: Payer: Self-pay

## 2020-05-19 DIAGNOSIS — M25562 Pain in left knee: Secondary | ICD-10-CM

## 2020-05-19 DIAGNOSIS — O26893 Other specified pregnancy related conditions, third trimester: Secondary | ICD-10-CM | POA: Diagnosis not present

## 2020-05-19 DIAGNOSIS — W19XXXA Unspecified fall, initial encounter: Secondary | ICD-10-CM | POA: Diagnosis not present

## 2020-05-19 DIAGNOSIS — O9A213 Injury, poisoning and certain other consequences of external causes complicating pregnancy, third trimester: Principal | ICD-10-CM | POA: Insufficient documentation

## 2020-05-19 DIAGNOSIS — Z3A30 30 weeks gestation of pregnancy: Secondary | ICD-10-CM | POA: Diagnosis not present

## 2020-05-19 DIAGNOSIS — Y92009 Unspecified place in unspecified non-institutional (private) residence as the place of occurrence of the external cause: Secondary | ICD-10-CM

## 2020-05-19 DIAGNOSIS — Y9301 Activity, walking, marching and hiking: Secondary | ICD-10-CM | POA: Diagnosis not present

## 2020-05-19 MED ORDER — ACETAMINOPHEN 325 MG PO TABS
650.0000 mg | ORAL_TABLET | Freq: Once | ORAL | Status: AC
  Administered 2020-05-19: 650 mg via ORAL
  Filled 2020-05-19: qty 2

## 2020-05-19 NOTE — ED Provider Notes (Signed)
[facility] Emergency Department Provider Note ____________________________________________  Time seen: 1530  I have reviewed the triage vital signs and the nursing notes.  HISTORY  Chief Complaint  Knee Pain and Fall  HPI Ashley Strickland is a 24 y.o. female G2 P1,  at [redacted] weeks gestation, presents for evaluation of acute left knee pain following a mechanical fall.  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  She reports the same occurred today while she was walking on flat ground.  She denies any head injury or abdominal injury.  She reports she was able to reduce the right knee without difficulty.  She however, fell landing on her left knee.  She presents today with some left knee pain.  She also reports lower abdominal cramps since the incident.  She denies any vaginal bleeding or concerning vaginal discharge.  Past Medical History:  Diagnosis Date  . Anemia   . Anxiety   . Arrhythmia   . Concussion   . Constipation   . Depression   . Dermatitis   . Joint pain   . Orthostatic hypotension   . Personal history of other mental and behavioral disorders   . POTS (postural orthostatic tachycardia syndrome)   . Pott's disease   . Vitamin D deficiency     Patient Active Problem List   Diagnosis Date Noted  . History of postpartum depression 01/19/2020  . Vitamin D deficiency 12/05/2019  . Gastroesophageal reflux disease without esophagitis 11/23/2019  . Eczema 10/12/2019  . Dysautonomia (HCC) 10/12/2019  . Anxiety and depression 09/23/2018  . POTS (postural orthostatic tachycardia syndrome) 02/26/2018  . Class 3 severe obesity with body mass index (BMI) of 45.0 to 49.9 in adult (HCC) 02/26/2018  . 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 ADENOIDECTOMY      Prior to Admission medications   Medication Sig Start Date End Date Taking? Authorizing Provider   metoCLOPramide (REGLAN) 10 MG tablet Take 1 tablet (10 mg total) by mouth 4 (four) times daily as needed for nausea, vomiting or refractory nausea / vomiting. 03/21/20   Hildred Laser, MD  ondansetron (ZOFRAN) 4 MG tablet Take 1 tablet (4 mg total) by mouth every 8 (eight) hours as needed for nausea or vomiting. 02/01/20   Hildred Laser, MD  pantoprazole (PROTONIX) 40 MG tablet Take 1 tablet (40 mg total) by mouth daily. 11/23/19   Hildred Laser, MD  Prenatal Vit-Fe Fumarate-FA (PRENATAL MULTIVITAMIN) TABS tablet Take 1 tablet by mouth daily at 12 noon.    [provider]  scopolamine (TRANSDERM-SCOP, 1.5 MG,) 1 MG/3DAYS Place 1 patch (1.5 mg total) onto the skin every 3 (three) days. 01/19/20   Hildred Laser, MD  sertraline (ZOLOFT) 50 MG tablet Take 1 tablet (50 mg total) by mouth daily. 03/21/20   Hildred Laser, MD  triamcinolone ointment (KENALOG) 0.1 % Apply 1 application topically 2 (two) times daily as needed. 10/04/19   Danelle Berry, PA-C    Allergies Phenergan [promethazine hcl]  Family History  Problem Relation Age of Onset  . Arthritis Mother   . Polycystic ovary syndrome Mother   . Arrhythmia Mother   . Anxiety disorder Mother   . Depression Mother   . Hodgkin's lymphoma Father   . Depression Father   . Anxiety disorder Father   . Arrhythmia Brother   . COPD Maternal Grandfather   . Heart disease Maternal Grandfather   . Clotting disorder  Maternal Grandfather   . Anxiety disorder Maternal Grandfather   . Depression Maternal Grandfather   . Diabetes Neg Hx   . CAD Neg Hx   . Cancer - Other Neg Hx     Social History Social History   Tobacco Use  . Smoking status: Never Smoker  . Smokeless tobacco: Never Used  Vaping Use  . Vaping Use: Never used  Substance Use Topics  . Alcohol use: Not Currently  . Drug use: No    Review of Systems  Constitutional: Negative for fever. Cardiovascular: Negative for chest pain. Respiratory: Negative for shortness of  breath. Gastrointestinal: Negative for abdominal pain, vomiting and diarrhea. Genitourinary: Negative for dysuria. Vaginal discharge Musculoskeletal: Negative for back pain. Left knee pain  Skin: Negative for rash. Neurological: Negative for headaches, focal weakness or numbness. ____________________________________________  PHYSICAL EXAM:  VITAL SIGNS: ED Triage Vitals  Enc Vitals Group     BP 05/19/20 1429 125/76     Pulse Rate 05/19/20 1429 (!) 104     Resp 05/19/20 1429 16     Temp 05/19/20 1431 98.4 F (36.9 C)     Temp Source 05/19/20 1431 Oral     SpO2 05/19/20 1429 97 %     Weight 05/19/20 1430 253 lb (114.8 kg)     Height 05/19/20 1430 5\' 1"  (1.549 m)     Head Circumference --      Peak Flow --      Pain Score 05/19/20 1430 6     Pain Loc --      Pain Edu? --      Excl. in GC? --     Constitutional: Alert and oriented. Well appearing and in no distress. Head: Normocephalic and atraumatic. Eyes: Conjunctivae are normal. Normal extraocular movements Neck: Supple. No thyromegaly. Cardiovascular: Normal rate, regular rhythm. Normal distal pulses. Respiratory: Normal respiratory effort. No wheezes/rales/rhonchi. Gastrointestinal: Soft, gravid, and nontender. No distention. GU: deferred  Musculoskeletal: Left knee without obvious deformity, dislocation, or joint effusion.  Patient able demonstrate normal flexion extension range on exam.  She is tender primarily to the anterior proximal tibia, with some early soft tissue swelling appreciated.  No internal derangement is suspected.  Nontender with normal range of motion in all extremities.  Neurologic:  Normal speech and language. No gross focal neurologic deficits are appreciated. Skin:  Skin is warm, dry and intact. No rash noted. Psychiatric: Mood and affect are normal. Patient exhibits appropriate insight and judgment.  ____________________________________________   RADIOLOGY  DG Right  Knee  Negative ____________________________________________  PROCEDURES  Tylenol 650 mg PO  Procedures ____________________________________________  INITIAL IMPRESSION / ASSESSMENT AND PLAN / ED COURSE  Female patient [redacted] weeks gestation, presents to the ED for evaluation of a mechanical fall onto her left knee.  Patient's exam is overall benign reassuring at this time.  No radiologic evidence of any internal derangement, fracture, or dislocation.  Patient presents with some soft tissue swelling and early bruising to the anterior proximal tibia.  She is discharged at this time to follow-up with her primary provider.  Patient did voice concern over her developing fetus, and we have cleared her medically to be transported upstairs to L&D for fetal monitoring.  Patient reports at the time of this disposition, improvement of her previously reported lower abdominal cramping.  She also reports no significant concern over the previously reported mucoid discharge.  Patient will be evaluated by her OB provider and discharged to follow-up as discussed patient may take Tylenol  as needed for pain relief.  Ashley Strickland was evaluated in Emergency Department on 05/19/2020 for the symptoms described in the history of present illness. She was evaluated in the context of the global COVID-19 pandemic, which necessitated consideration that the patient might be at risk for infection with the SARS-CoV-2 virus that causes COVID-19. Institutional protocols and algorithms that pertain to the evaluation of patients at risk for COVID-19 are in a state of rapid change based on information released by regulatory bodies including the CDC and federal and state organizations. These policies and algorithms were followed during the patient's care in the ED. ____________________________________________  FINAL CLINICAL IMPRESSION(S) / ED DIAGNOSES  Final diagnoses:  Acute pain of left knee      Karmen Stabs, Charlesetta Ivory,  PA-C 05/19/20 1828    Minna Antis, MD 05/19/20 2220

## 2020-05-19 NOTE — ED Triage Notes (Signed)
Pt to ED via POV stating that she dislocated her left knee and fell today. Pt states that it is normal for her to dislocated her knee and that her knee is back in place. Pt reports landing on her right knee and buttocks. Pt states that she is having "really bad cramps". Pt denies bleeding.  Pt is [redacted] weeks pregnant, G2P1. Pt sees [facility]. Pt is in NAD.

## 2020-05-19 NOTE — Discharge Instructions (Signed)
Your exam and XR are normal at this time. You should take OTC Tylenol as needed for pain. Follow-up with your [facility] provider as scheduled.

## 2020-05-19 NOTE — OB Triage Note (Signed)
Patient to OBS 3 with complaint of lower abdominal cramping that she rates as 5/10. Patient fell at 1300 this afternoon. She received 650mg  tylenol in the ED.  She describes that her left knee dislocated and she landed on her right knee then fell backwards on her buttocks. She reports baby moving well, no bleeding or leaking of fluid.  Evans MD order to monitor until 1930 and may discharge home.

## 2020-05-19 NOTE — ED Notes (Signed)
Report given to Labor and delivery RN. Pt going to obs room 4, Dr. Logan Bores to see.

## 2020-05-21 NOTE — Discharge Summary (Signed)
    L&D OB Triage Note  SUBJECTIVE Ashley Strickland is a 24 y.o. G2P1001 female at [redacted]w[redacted]d, EDD Estimated Date of Delivery: 07/27/20 who presented to triage from the ED.  Patient fell at 1300 this afternoon. She describes that her left knee dislocated and she landed on her right knee then fell backwards on her buttocks. She reports baby moving well, no bleeding or leaking of fluid.  OB History  Gravida Para Term Preterm AB Living  2 1 1  0 0 1  SAB TAB Ectopic Multiple Live Births  0 0 0 0 1    # Outcome Date GA Lbr Len/2nd Weight Sex Delivery Anes PTL Lv  2 Current           1 Term 09/09/18 [redacted]w[redacted]d / 01:30 3250 g M Vag-Spont EPI  LIV     Name: Vital,BOY Fiana     Apgar1: 8  Apgar5: 9    No medications prior to admission.     OBJECTIVE  Nursing Evaluation:   BP 122/76 (BP Location: Right Arm)   Pulse 93   Temp 98.2 F (36.8 C) (Oral)   Resp 16   Ht 5\' 1"  (1.549 m)   Wt 114.8 kg Comment: 253 lbs  LMP 10/21/2019   SpO2 97%   BMI 47.80 kg/m    Findings:        Patient monitored for 4 hours -precautionary for abruption.     No evidence of abruption.      NST was performed and has been reviewed by me.  NST INTERPRETATION: Category I  Mode: External Baseline Rate (A): 140 bpm Variability: Moderate Accelerations: 15 x 15 Decelerations: None     Contraction Frequency (min): none  ASSESSMENT Impression:  1.  Pregnancy:  G2P1001 at [redacted]w[redacted]d , EDD Estimated Date of Delivery: 07/27/20 2.  Reassuring fetal and maternal status   PLAN 1. Discussed current condition and above findings with patient and reassurance given.  All questions answered. 2. Discharge home with standard labor precautions given to return to L&D or call the office for problems. 3. Continue routine prenatal care.

## 2020-05-29 ENCOUNTER — Encounter: Payer: Self-pay | Admitting: Physical Therapy

## 2020-05-29 ENCOUNTER — Other Ambulatory Visit: Payer: Self-pay

## 2020-05-29 ENCOUNTER — Ambulatory Visit: Payer: BC Managed Care – PPO | Admitting: Physical Therapy

## 2020-05-29 DIAGNOSIS — M6281 Muscle weakness (generalized): Secondary | ICD-10-CM

## 2020-05-29 DIAGNOSIS — R278 Other lack of coordination: Secondary | ICD-10-CM

## 2020-05-29 DIAGNOSIS — R102 Pelvic and perineal pain: Secondary | ICD-10-CM | POA: Diagnosis not present

## 2020-05-29 NOTE — Progress Notes (Signed)
ROB-Pt present for routine prenatal care. Pt c/o of lower back pain.

## 2020-05-29 NOTE — Therapy (Signed)
[facility] [address] [place], [place], [address] Phone: [phone]   Fax:  [phone]  Physical Therapy Treatment  Patient Details  Name: Ashley Strickland MRN: [id number] Date of Birth: [birth year] Referring Provider (PT): Hildred Laser   Encounter Date: 05/29/2020   PT End of Session - 05/29/20 1409    Visit Number 9    Number of Visits 18    Date for PT Re-Evaluation 06/26/20    Authorization Type IE 02/21/2020    PT Start Time 1400    PT Stop Time 1455    PT Time Calculation (min) 55 min    Activity Tolerance Patient tolerated treatment well    Behavior During Therapy [facility] for tasks assessed/performed           Past Medical History:  Diagnosis Date  . Anemia   . Anxiety   . Arrhythmia   . Concussion   . Constipation   . Depression   . Dermatitis   . Joint pain   . Orthostatic hypotension   . Personal history of other mental and behavioral disorders   . POTS (postural orthostatic tachycardia syndrome)   . Vitamin D deficiency     Past Surgical History:  Procedure Laterality Date  . ADENOIDECTOMY      There were no vitals filed for this visit.   Subjective Assessment - 05/29/20 1406    Subjective Patient notes she has had continued B hip pain and discomfort since fall. Patient states that prior to the fall she had times where she did not have pain, but since the fall she is in constant pain. She also states that she is concerned for UTI at present. Patient intends to notfiy MD of her concerns for UTI tomorrow at scheduled appointment. Patient notes constant pain is in hips and low back and attributes this to the fall.    Currently in Pain? Yes    Pain Score 5     Pain Location Hip    Pain Orientation Left;Right           TREATMENT Manual Therapy: STM and TPR performed to B thoracic paraspinals and B hip complexes to allow for decreased tension and pain and improved posture and function CPA/UPA of  thoracolumbar junction in supported forward flexion in sitting for improved joint mobility  Neuromuscular Re-education: Supported (seated) child's pose diaphragmatic breathing for improved PFM coordination and lengthening Physioball postural control series for decreased pain and improved posture:  Seated TrA activation  Pelvic tilts with coordinated breath  Pelvic clocks with coordinated breath   Patient educated throughout session on appropriate technique and form using multi-modal cueing, HEP, and activity modification. Patient articulated understanding and returned demonstration.  Patient Response to interventions: 3/10 pain at end of session  ASSESSMENT Patient presents to clinic with motivation to participate in physical therapy. Patient demonstrates deficits in pain, posture, strength, body mechanics, joint stability/balance. Despite recent fall and hopsital admission, patient has maintained progress. However, patient has had increase in pain since fall with what she reports as constant pain. Patient does continue to have significant pain reduction with manual interventions and deep core training on physioball. Patient will benefit from continued skilled therapeutic intervention to address remaining deficits in pain, posture, strength, body mechanics, joint stability/balance in order to increase function, and improve overall QOL.        PT Long Term Goals - 05/29/20 1410      PT LONG TERM GOAL #1  Title Patient will demonstrate independence with HEP in order to maximize therapeutic gains and improve carryover from physical therapy sessions to ADLs in the home and community.    Baseline IE: not provided; 11/16: IND    Time 6    Period Weeks    Status Achieved      PT LONG TERM GOAL #2   Title Patient will demonstrate improved function as evidenced by a score of 62 on FOTO measure for full participation in activities at home and in the community.    Baseline IE: Bowel  Constipation 52; 11/16: 60; 11/30:    Time 6    Period Weeks    Status On-going    Target Date 06/26/20      PT LONG TERM GOAL #3   Title Patient will decrease worst pain as reported on NPRS by at least 2 points to demonstrate clinically significant reduction in pain in order to restore/improve function and overall QOL.    Baseline IE: 10/10 (pubic symphysis); 11/16: 3-4/10 (pubic symphysis) 7-8/10 (B lateral hip); 11/30: 3-4/10 (pubic symphysis), 7-8/10 B hip pain constant    Time 6    Period Weeks    Status On-going    Target Date 06/26/20      PT LONG TERM GOAL #4   Title Patient will report "moderate difficulty" with participation in household chores and physical activities on PFDI Pain measure for improved independence and function at home and in the community.    Baseline IE: 'extreme difficulty'; 11/16: "moderate difficulty"; 11/30:    Time 6    Period Weeks    Status On-going    Target Date 06/26/20                 Plan - 05/29/20 1409    Clinical Impression Statement Patient presents to clinic with motivation to participate in physical therapy. Patient demonstrates deficits in pain, posture, strength, body mechanics, joint stability/balance. Despite recent fall and hopsital admission, patient has maintained progress. However, patient has had increase in pain since fall with what she reports as constant pain. Patient does continue to have significant pain reduction with manual interventions and deep core training on physioball. Patient will benefit from continued skilled therapeutic intervention to address remaining deficits in pain, posture, strength, body mechanics, joint stability/balance in order to increase function, and improve overall QOL.    Personal Factors and Comorbidities Age;Education;Behavior Pattern;Fitness;Past/Current Experience;Comorbidity 3+;Time since onset of injury/illness/exacerbation    Comorbidities anxiety, depression, POTS, arrythmia, joint pain,  dermatitis, personal hx of mental and behavioral disorders    Examination-Activity Limitations Dressing;Sit;Transfers;Bed Mobility;Lift;Squat;Bend;Caring for Others;Continence;Stairs;Stand;Locomotion Level;Sleep    Examination-Participation Restrictions Interpersonal Relationship;Occupation;Laundry;Cleaning;Yard Work;Meal Prep    Stability/Clinical Decision Making Evolving/Moderate complexity    Rehab Potential Fair    PT Frequency 1x / week    PT Duration 6 weeks    PT Treatment/Interventions ADLs/Self Care Home Management;Aquatic Therapy;Moist Heat;Cryotherapy;Gait training;Stair training;Functional mobility training;Therapeutic activities;Neuromuscular re-education;Balance training;Therapeutic exercise;Patient/family education;Taping;Dry needling;Passive range of motion;Manual techniques;Compression bandaging    PT Next Visit Plan manual PRN; gentle stretches for hips and postural re-ed    PT Home Exercise Plan see handouts    Consulted and Agree with Plan of Care Patient           Patient will benefit from skilled therapeutic intervention in order to improve the following deficits and impairments:  Abnormal gait, Decreased balance, Decreased endurance, Difficulty walking, Obesity, Pain, Postural dysfunction, Improper body mechanics, Hypermobility, Decreased strength, Decreased coordination, Decreased activity tolerance  Visit  Diagnosis: Pelvic pain  Muscle weakness (generalized)  Other lack of coordination     Problem List Patient Active Problem List   Diagnosis Date Noted  . Fall at home 05/19/2020  . History of postpartum depression 01/19/2020  . Vitamin D deficiency 12/05/2019  . Gastroesophageal reflux disease without esophagitis 11/23/2019  . Eczema 10/12/2019  . Dysautonomia (HCC) 10/12/2019  . Anxiety and depression 09/23/2018  . POTS (postural orthostatic tachycardia syndrome) 02/26/2018  . Class 3 severe obesity with body mass index (BMI) of 45.0 to 49.9 in adult  (HCC) 02/26/2018  . 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

## 2020-05-30 ENCOUNTER — Encounter: Payer: Self-pay | Admitting: Obstetrics and Gynecology

## 2020-05-30 ENCOUNTER — Ambulatory Visit (INDEPENDENT_AMBULATORY_CARE_PROVIDER_SITE_OTHER): Payer: BC Managed Care – PPO | Admitting: Obstetrics and Gynecology

## 2020-05-30 VITALS — BP 104/75 | HR 105 | Wt 254.9 lb

## 2020-05-30 DIAGNOSIS — R399 Unspecified symptoms and signs involving the genitourinary system: Secondary | ICD-10-CM

## 2020-05-30 DIAGNOSIS — R102 Pelvic and perineal pain: Secondary | ICD-10-CM

## 2020-05-30 DIAGNOSIS — Z6841 Body Mass Index (BMI) 40.0 and over, adult: Secondary | ICD-10-CM

## 2020-05-30 DIAGNOSIS — Z3483 Encounter for supervision of other normal pregnancy, third trimester: Secondary | ICD-10-CM

## 2020-05-30 DIAGNOSIS — Z3A31 31 weeks gestation of pregnancy: Secondary | ICD-10-CM

## 2020-05-30 DIAGNOSIS — O26892 Other specified pregnancy related conditions, second trimester: Secondary | ICD-10-CM

## 2020-05-30 LAB — POCT URINALYSIS DIPSTICK OB
Bilirubin, UA: NEGATIVE
Glucose, UA: NEGATIVE
Ketones, UA: NEGATIVE
Leukocytes, UA: NEGATIVE
Nitrite, UA: NEGATIVE
POC,PROTEIN,UA: NEGATIVE
Spec Grav, UA: 1.015 (ref 1.010–1.025)
Urobilinogen, UA: 0.2 E.U./dL
pH, UA: 7 (ref 5.0–8.0)

## 2020-05-30 NOTE — Progress Notes (Signed)
ROB: Patient overall doing ok.  Has been doing physical therapy for pelvic pain which has been helping. Had a recent fall last week. Also noting urinary symptoms. UA today negative, advised on Azo/cranberry juice and increased water intake. RTC in 2 weeks, for growth scan at that time for obesity in pregnancy.

## 2020-05-30 NOTE — Patient Instructions (Signed)
Third Trimester of Pregnancy The third trimester is from week 28 through week 40 (months 7 through 9). The third trimester is a time when the unborn baby (fetus) is growing rapidly. At the end of the ninth month, the fetus is about 20 inches in length and weighs 6-10 pounds. Body changes during your third trimester Your body will continue to go through many changes during pregnancy. The changes vary from woman to woman. During the third trimester:  Your weight will continue to increase. You can expect to gain 25-35 pounds (11-16 kg) by the end of the pregnancy.  You may begin to get stretch marks on your hips, abdomen, and breasts.  You may urinate more often because the fetus is moving lower into your pelvis and pressing on your bladder.  You may develop or continue to have heartburn. This is caused by increased hormones that slow down muscles in the digestive tract.  You may develop or continue to have constipation because increased hormones slow digestion and cause the muscles that push waste through your intestines to relax.  You may develop hemorrhoids. These are swollen veins (varicose veins) in the rectum that can itch or be painful.  You may develop swollen, bulging veins (varicose veins) in your legs.  You may have increased body aches in the pelvis, back, or thighs. This is due to weight gain and increased hormones that are relaxing your joints.  You may have changes in your hair. These can include thickening of your hair, rapid growth, and changes in texture. Some women also have hair loss during or after pregnancy, or hair that feels dry or thin. Your hair will most likely return to normal after your baby is born.  Your breasts will continue to grow and they will continue to become tender. A yellow fluid (colostrum) may leak from your breasts. This is the first milk you are producing for your baby.  Your belly button may stick out.  You may notice more swelling in your hands,  face, or ankles.  You may have increased tingling or numbness in your hands, arms, and legs. The skin on your belly may also feel numb.  You may feel short of breath because of your expanding uterus.  You may have more problems sleeping. This can be caused by the size of your belly, increased need to urinate, and an increase in your body's metabolism.  You may notice the fetus "dropping," or moving lower in your abdomen (lightening).  You may have increased vaginal discharge.  You may notice your joints feel loose and you may have pain around your pelvic bone. What to expect at prenatal visits You will have prenatal exams every 2 weeks until week 36. Then you will have weekly prenatal exams. During a routine prenatal visit:  You will be weighed to make sure you and the baby are growing normally.  Your blood pressure will be taken.  Your abdomen will be measured to track your baby's growth.  The fetal heartbeat will be listened to.  Any test results from the previous visit will be discussed.  You may have a cervical check near your due date to see if your cervix has softened or thinned (effaced).  You will be tested for Group B streptococcus. This happens between 35 and 37 weeks. Your health care provider may ask you:  What your birth plan is.  How you are feeling.  If you are feeling the baby move.  If you have had any abnormal   symptoms, such as leaking fluid, bleeding, severe headaches, or abdominal cramping.  If you are using any tobacco products, including cigarettes, chewing tobacco, and electronic cigarettes.  If you have any questions. Other tests or screenings that may be performed during your third trimester include:  Blood tests that check for low iron levels (anemia).  Fetal testing to check the health, activity level, and growth of the fetus. Testing is done if you have certain medical conditions or if there are problems during the pregnancy.  Nonstress test  (NST). This test checks the health of your baby to make sure there are no signs of problems, such as the baby not getting enough oxygen. During this test, a belt is placed around your belly. The baby is made to move, and its heart rate is monitored during movement. What is false labor? False labor is a condition in which you feel small, irregular tightenings of the muscles in the womb (contractions) that usually go away with rest, changing position, or drinking water. These are called Braxton Hicks contractions. Contractions may last for hours, days, or even weeks before true labor sets in. If contractions come at regular intervals, become more frequent, increase in intensity, or become painful, you should see your health care provider. What are the signs of labor?  Abdominal cramps.  Regular contractions that start at 10 minutes apart and become stronger and more frequent with time.  Contractions that start on the top of the uterus and spread down to the lower abdomen and back.  Increased pelvic pressure and dull back pain.  A watery or bloody mucus discharge that comes from the vagina.  Leaking of amniotic fluid. This is also known as your "water breaking." It could be a slow trickle or a gush. Let your health care provider know if it has a color or strange odor. If you have any of these signs, call your health care provider right away, even if it is before your due date. Follow these instructions at home: Medicines  Follow your health care provider's instructions regarding medicine use. Specific medicines may be either safe or unsafe to take during pregnancy.  Take a prenatal vitamin that contains at least 600 micrograms (mcg) of folic acid.  If you develop constipation, try taking a stool softener if your health care provider approves. Eating and drinking   Eat a balanced diet that includes fresh fruits and vegetables, whole grains, good sources of protein such as meat, eggs, or tofu,  and low-fat dairy. Your health care provider will help you determine the amount of weight gain that is right for you.  Avoid raw meat and uncooked cheese. These carry germs that can cause birth defects in the baby.  If you have low calcium intake from food, talk to your health care provider about whether you should take a daily calcium supplement.  Eat four or five small meals rather than three large meals a day.  Limit foods that are high in fat and processed sugars, such as fried and sweet foods.  To prevent constipation: ? Drink enough fluid to keep your urine clear or pale yellow. ? Eat foods that are high in fiber, such as fresh fruits and vegetables, whole grains, and beans. Activity  Exercise only as directed by your health care provider. Most women can continue their usual exercise routine during pregnancy. Try to exercise for 30 minutes at least 5 days a week. Stop exercising if you experience uterine contractions.  Avoid heavy lifting.  Do   not exercise in extreme heat or humidity, or at high altitudes.  Wear low-heel, comfortable shoes.  Practice good posture.  You may continue to have sex unless your health care provider tells you otherwise. Relieving pain and discomfort  Take frequent breaks and rest with your legs elevated if you have leg cramps or low back pain.  Take warm sitz baths to soothe any pain or discomfort caused by hemorrhoids. Use hemorrhoid cream if your health care provider approves.  Wear a good support bra to prevent discomfort from breast tenderness.  If you develop varicose veins: ? Wear support pantyhose or compression stockings as told by your healthcare provider. ? Elevate your feet for 15 minutes, 3-4 times a day. Prenatal care  Write down your questions. Take them to your prenatal visits.  Keep all your prenatal visits as told by your health care provider. This is important. Safety  Wear your seat belt at all times when driving.  Make  a list of emergency phone numbers, including numbers for family, friends, the hospital, and police and fire departments. General instructions  Avoid cat litter boxes and soil used by cats. These carry germs that can cause birth defects in the baby. If you have a cat, ask someone to clean the litter box for you.  Do not travel far distances unless it is absolutely necessary and only with the approval of your health care provider.  Do not use hot tubs, steam rooms, or saunas.  Do not drink alcohol.  Do not use any products that contain nicotine or tobacco, such as cigarettes and e-cigarettes. If you need help quitting, ask your health care provider.  Do not use any medicinal herbs or unprescribed drugs. These chemicals affect the formation and growth of the baby.  Do not douche or use tampons or scented sanitary pads.  Do not cross your legs for long periods of time.  To prepare for the arrival of your baby: ? Take prenatal classes to understand, practice, and ask questions about labor and delivery. ? Make a trial run to the hospital. ? Visit the hospital and tour the maternity area. ? Arrange for maternity or paternity leave through employers. ? Arrange for family and friends to take care of pets while you are in the hospital. ? Purchase a rear-facing car seat and make sure you know how to install it in your car. ? Pack your hospital bag. ? Prepare the baby's nursery. Make sure to remove all pillows and stuffed animals from the baby's crib to prevent suffocation.  Visit your dentist if you have not gone during your pregnancy. Use a soft toothbrush to brush your teeth and be gentle when you floss. Contact a health care provider if:  You are unsure if you are in labor or if your water has broken.  You become dizzy.  You have mild pelvic cramps, pelvic pressure, or nagging pain in your abdominal area.  You have lower back pain.  You have persistent nausea, vomiting, or  diarrhea.  You have an unusual or bad smelling vaginal discharge.  You have pain when you urinate. Get help right away if:  Your water breaks before 37 weeks.  You have regular contractions less than 5 minutes apart before 37 weeks.  You have a fever.  You are leaking fluid from your vagina.  You have spotting or bleeding from your vagina.  You have severe abdominal pain or cramping.  You have rapid weight loss or weight gain.  You have   shortness of breath with chest pain.  You notice sudden or extreme swelling of your face, hands, ankles, feet, or legs.  Your baby makes fewer than 10 movements in 2 hours.  You have severe headaches that do not go away when you take medicine.  You have vision changes. Summary  The third trimester is from week 28 through week 40, months 7 through 9. The third trimester is a time when the unborn baby (fetus) is growing rapidly.  During the third trimester, your discomfort may increase as you and your baby continue to gain weight. You may have abdominal, leg, and back pain, sleeping problems, and an increased need to urinate.  During the third trimester your breasts will keep growing and they will continue to become tender. A yellow fluid (colostrum) may leak from your breasts. This is the first milk you are producing for your baby.  False labor is a condition in which you feel small, irregular tightenings of the muscles in the womb (contractions) that eventually go away. These are called Braxton Hicks contractions. Contractions may last for hours, days, or even weeks before true labor sets in.  Signs of labor can include: abdominal cramps; regular contractions that start at 10 minutes apart and become stronger and more frequent with time; watery or bloody mucus discharge that comes from the vagina; increased pelvic pressure and dull back pain; and leaking of amniotic fluid. This information is not intended to replace advice given to you by your  health care provider. Make sure you discuss any questions you have with your health care provider. Document Revised: 10/07/2018 Document Reviewed: 07/22/2016 [employer] Patient Education  2020 [employer]  

## 2020-06-06 ENCOUNTER — Encounter: Payer: Self-pay | Admitting: Physical Therapy

## 2020-06-06 ENCOUNTER — Other Ambulatory Visit: Payer: Self-pay

## 2020-06-06 ENCOUNTER — Ambulatory Visit: Payer: BC Managed Care – PPO | Attending: Obstetrics and Gynecology | Admitting: Physical Therapy

## 2020-06-06 DIAGNOSIS — R278 Other lack of coordination: Secondary | ICD-10-CM | POA: Insufficient documentation

## 2020-06-06 DIAGNOSIS — R102 Pelvic and perineal pain: Secondary | ICD-10-CM | POA: Diagnosis not present

## 2020-06-06 DIAGNOSIS — M6281 Muscle weakness (generalized): Secondary | ICD-10-CM | POA: Diagnosis present

## 2020-06-06 NOTE — Therapy (Signed)
[facility] [address]. [place], [place], [address] Phone: [phone]   Fax:  [phone]  Physical Therapy Treatment  Patient Details  Name: Ashley Strickland MRN: [id number] Date of Birth: 1996-01-18 Referring Provider (PT): Hildred Laser   Encounter Date: 06/06/2020   PT End of Session - 06/06/20 1411    Visit Number 10    Number of Visits 18    Date for PT Re-Evaluation 06/26/20    Authorization Type IE 02/21/2020    PT Start Time 1400    PT Stop Time 1432    PT Time Calculation (min) 32 min    Activity Tolerance Patient tolerated treatment well    Behavior During Therapy [facility] for tasks assessed/performed           Past Medical History:  Diagnosis Date  . Anemia   . Anxiety   . Arrhythmia   . Concussion   . Constipation   . Depression   . Dermatitis   . Joint pain   . Orthostatic hypotension   . Personal history of other mental and behavioral disorders   . POTS (postural orthostatic tachycardia syndrome)   . Vitamin D deficiency     Past Surgical History:  Procedure Laterality Date  . ADENOIDECTOMY      There were no vitals filed for this visit.   Subjective Assessment - 06/06/20 1410    Subjective Patient continues to complain of B hip pain and low back pain. She notes increased stress 2/2 to her partents contracting Covid and having to cancel her baby shower.    Currently in Pain? Yes    Pain Score 7     Pain Location Hip    Pain Orientation Left;Right           TREATMENT Manual Therapy: STM and TPR performed to B thoracic paraspinals and B hip complexes to allow for decreased tension and pain and improved posture and function CPA/UPA of thoracolumbar junction in supported forward flexion in sitting for improved joint mobility  Neuromuscular Re-education: Supported (seated) child's pose diaphragmatic breathing for improved PFM coordination and lengthening   Patient educated throughout  session on appropriate technique and form using multi-modal cueing, HEP, and activity modification. Patient articulated understanding and returned demonstration.  Patient Response to interventions: 3/10 pain at end of session  ASSESSMENT Patient presents to clinic with motivation to participate in physical therapy. Patient demonstrates deficits in pain, posture, strength, body mechanics, joint stability/balance. Patient with limited participation in session today 2/2 to pain and caregiving demands. Patient had significant pain reduction with manual interventions and nervous system downtraining during today's session. Patient will benefit from continued skilled therapeutic intervention to address remaining deficits in pain, posture, strength, body mechanics, joint stability/balance in order to increase function, and improve overall QOL.      PT Long Term Goals - 05/29/20 1410      PT LONG TERM GOAL #1   Title Patient will demonstrate independence with HEP in order to maximize therapeutic gains and improve carryover from physical therapy sessions to ADLs in the home and community.    Baseline IE: not provided; 11/16: IND    Time 6    Period Weeks    Status Achieved      PT LONG TERM GOAL #2   Title Patient will demonstrate improved function as evidenced by a score of 62 on FOTO measure for full participation in activities at home and in the community.  Baseline IE: Bowel Constipation 52; 11/16: 60; 11/30:    Time 6    Period Weeks    Status On-going    Target Date 06/26/20      PT LONG TERM GOAL #3   Title Patient will decrease worst pain as reported on NPRS by at least 2 points to demonstrate clinically significant reduction in pain in order to restore/improve function and overall QOL.    Baseline IE: 10/10 (pubic symphysis); 11/16: 3-4/10 (pubic symphysis) 7-8/10 (B lateral hip); 11/30: 3-4/10 (pubic symphysis), 7-8/10 B hip pain constant    Time 6    Period Weeks    Status  On-going    Target Date 06/26/20      PT LONG TERM GOAL #4   Title Patient will report "moderate difficulty" with participation in household chores and physical activities on PFDI Pain measure for improved independence and function at home and in the community.    Baseline IE: 'extreme difficulty'; 11/16: "moderate difficulty"; 11/30:    Time 6    Period Weeks    Status On-going    Target Date 06/26/20                 Plan - 06/06/20 1412    Clinical Impression Statement Patient presents to clinic with motivation to participate in physical therapy. Patient demonstrates deficits in pain, posture, strength, body mechanics, joint stability/balance. Patient with limited participation in session today 2/2 to pain and caregiving demands. Patient had significant pain reduction with manual interventions and nervous system downtraining during today's session. Patient will benefit from continued skilled therapeutic intervention to address remaining deficits in pain, posture, strength, body mechanics, joint stability/balance in order to increase function, and improve overall QOL.    Personal Factors and Comorbidities Age;Education;Behavior Pattern;Fitness;Past/Current Experience;Comorbidity 3+;Time since onset of injury/illness/exacerbation    Comorbidities anxiety, depression, POTS, arrythmia, joint pain, dermatitis, personal hx of mental and behavioral disorders    Examination-Activity Limitations Dressing;Sit;Transfers;Bed Mobility;Lift;Squat;Bend;Caring for Others;Continence;Stairs;Stand;Locomotion Level;Sleep    Examination-Participation Restrictions Interpersonal Relationship;Occupation;Laundry;Cleaning;Yard Work;Meal Prep    Stability/Clinical Decision Making Evolving/Moderate complexity    Rehab Potential Fair    PT Frequency 1x / week    PT Duration 6 weeks    PT Treatment/Interventions ADLs/Self Care Home Management;Aquatic Therapy;Moist Heat;Cryotherapy;Gait training;Stair  training;Functional mobility training;Therapeutic activities;Neuromuscular re-education;Balance training;Therapeutic exercise;Patient/family education;Taping;Dry needling;Passive range of motion;Manual techniques;Compression bandaging    PT Next Visit Plan manual PRN; gentle stretches for hips and postural re-ed    PT Home Exercise Plan see handouts    Consulted and Agree with Plan of Care Patient           Patient will benefit from skilled therapeutic intervention in order to improve the following deficits and impairments:  Abnormal gait, Decreased balance, Decreased endurance, Difficulty walking, Obesity, Pain, Postural dysfunction, Improper body mechanics, Hypermobility, Decreased strength, Decreased coordination, Decreased activity tolerance  Visit Diagnosis: Pelvic pain  Muscle weakness (generalized)  Other lack of coordination     Problem List Patient Active Problem List   Diagnosis Date Noted  . Fall at home 05/19/2020  . History of postpartum depression 01/19/2020  . Vitamin D deficiency 12/05/2019  . Gastroesophageal reflux disease without esophagitis 11/23/2019  . Eczema 10/12/2019  . Dysautonomia (HCC) 10/12/2019  . Anxiety and depression 09/23/2018  . POTS (postural orthostatic tachycardia syndrome) 02/26/2018  . Class 3 severe obesity with body mass index (BMI) of 45.0 to 49.9 in adult (HCC) 02/26/2018  . 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 [place], [place], [address] Phone: [phone]   Fax:  [phone]  Name: Ashley Strickland MRN: [id number] Date of Birth: 1995-10-15

## 2020-06-07 ENCOUNTER — Ambulatory Visit (INDEPENDENT_AMBULATORY_CARE_PROVIDER_SITE_OTHER): Payer: BC Managed Care – PPO | Admitting: Obstetrics and Gynecology

## 2020-06-07 ENCOUNTER — Ambulatory Visit (INDEPENDENT_AMBULATORY_CARE_PROVIDER_SITE_OTHER): Payer: BC Managed Care – PPO

## 2020-06-07 ENCOUNTER — Encounter: Payer: Self-pay | Admitting: Obstetrics and Gynecology

## 2020-06-07 VITALS — BP 114/82 | HR 105 | Wt 256.3 lb

## 2020-06-07 DIAGNOSIS — Z3A32 32 weeks gestation of pregnancy: Secondary | ICD-10-CM | POA: Diagnosis not present

## 2020-06-07 DIAGNOSIS — Z6841 Body Mass Index (BMI) 40.0 and over, adult: Secondary | ICD-10-CM

## 2020-06-07 DIAGNOSIS — Z3689 Encounter for other specified antenatal screening: Secondary | ICD-10-CM | POA: Diagnosis not present

## 2020-06-07 DIAGNOSIS — Z3483 Encounter for supervision of other normal pregnancy, third trimester: Secondary | ICD-10-CM

## 2020-06-07 DIAGNOSIS — N3001 Acute cystitis with hematuria: Secondary | ICD-10-CM

## 2020-06-07 LAB — POCT URINALYSIS DIPSTICK OB
Bilirubin, UA: NEGATIVE
Glucose, UA: NEGATIVE
Ketones, UA: NEGATIVE
Leukocytes, UA: NEGATIVE
Nitrite, UA: NEGATIVE
POC,PROTEIN,UA: NEGATIVE
Spec Grav, UA: 1.015
Urobilinogen, UA: 0.2 U/dL
pH, UA: 6.5

## 2020-06-07 MED ORDER — NITROFURANTOIN MONOHYD MACRO 100 MG PO CAPS: 100.0000 mg | ORAL_CAPSULE | Freq: Two times a day (BID) | ORAL | 1 refills | Status: DC

## 2020-06-07 NOTE — Progress Notes (Signed)
ROB: Patient with complaint of occasional pelvic cramping and frequent urination.  Hematuria noted on urine dip.  Will treat with Macrobid.  Cultures sent.  Patient had ultrasound today for growth.  17th percentile.  Subjectively low AFI.  I have discussed this with the patient encouraged increased hydration.  We will follow-up growth and amniotic fluid in 3 weeks.

## 2020-06-07 NOTE — Addendum Note (Signed)
Addended by: Dorian Pod on: 06/07/2020 04:06 PM   Modules accepted: Orders

## 2020-06-11 LAB — URINE CULTURE

## 2020-06-12 ENCOUNTER — Encounter: Payer: Self-pay | Admitting: Physical Therapy

## 2020-06-20 ENCOUNTER — Encounter: Payer: BC Managed Care – PPO | Admitting: Obstetrics and Gynecology

## 2020-06-21 ENCOUNTER — Encounter: Payer: BC Managed Care – PPO | Admitting: Obstetrics and Gynecology

## 2020-06-21 NOTE — Progress Notes (Deleted)
ROB-Pt present  

## 2020-06-26 ENCOUNTER — Encounter: Payer: Self-pay | Admitting: Obstetrics and Gynecology

## 2020-06-26 ENCOUNTER — Ambulatory Visit: Payer: BC Managed Care – PPO | Admitting: Physical Therapy

## 2020-06-28 ENCOUNTER — Other Ambulatory Visit: Payer: Self-pay

## 2020-06-28 ENCOUNTER — Ambulatory Visit (INDEPENDENT_AMBULATORY_CARE_PROVIDER_SITE_OTHER): Payer: BC Managed Care – PPO

## 2020-06-28 DIAGNOSIS — O321XX0 Maternal care for breech presentation, not applicable or unspecified: Secondary | ICD-10-CM | POA: Diagnosis not present

## 2020-06-28 DIAGNOSIS — O365930 Maternal care for other known or suspected poor fetal growth, third trimester, not applicable or unspecified: Secondary | ICD-10-CM

## 2020-06-28 DIAGNOSIS — Z3A35 35 weeks gestation of pregnancy: Secondary | ICD-10-CM | POA: Diagnosis not present

## 2020-06-29 ENCOUNTER — Other Ambulatory Visit: Payer: Self-pay

## 2020-06-29 ENCOUNTER — Ambulatory Visit (INDEPENDENT_AMBULATORY_CARE_PROVIDER_SITE_OTHER): Payer: BC Managed Care – PPO | Admitting: Obstetrics and Gynecology

## 2020-06-29 ENCOUNTER — Encounter: Payer: Self-pay | Admitting: Obstetrics and Gynecology

## 2020-06-29 VITALS — BP 110/72 | HR 118 | Wt 258.8 lb

## 2020-06-29 DIAGNOSIS — Z3A36 36 weeks gestation of pregnancy: Secondary | ICD-10-CM

## 2020-06-29 DIAGNOSIS — Z3483 Encounter for supervision of other normal pregnancy, third trimester: Secondary | ICD-10-CM

## 2020-06-29 LAB — POCT URINALYSIS DIPSTICK OB
Bilirubin, UA: NEGATIVE
Glucose, UA: NEGATIVE
Ketones, UA: NEGATIVE
Nitrite, UA: NEGATIVE
POC,PROTEIN,UA: NEGATIVE
Spec Grav, UA: >=1.03 — AB (ref 1.010–1.025)
Urobilinogen, UA: 0.2 E.U./dL
pH, UA: 6 (ref 5.0–8.0)

## 2020-06-29 NOTE — Patient Instructions (Signed)
Breech Birth A breech birth is when a baby is born with the buttocks or feet first. Most babies are in a head down (vertex) position when they are born. There are three types of breech babies:  When the baby's buttocks are showing first in the vagina (birth canal) with the legs bent at the knees and the feet down near the buttocks (complete breech).  When the baby's buttocks are showing first in the birth canal with the legs straight up and the feet at the baby's head (frank breech).  When one or both of the baby's feet are showing first in the birth canal along with the buttocks (footling breech). What are the health risks of having a breech birth? Having a breech birth increases the health risks to your baby. A breech birth may cause the following:  Umbilical cord prolapse. This is when the umbilical cord enters the birth canal ahead of the baby, before or during labor. This can cause the cord to become pinched or compressed as labor continues. This can reduce the flow of blood and oxygen to the baby.  The baby getting stuck in the birth canal, which can cause injury or, rarely, death.  Injury to the baby's nerves in the shoulder, arm, and hand (brachial plexus injury) when delivered. What increases the risk of having a breech baby? It is not known what causes your baby to be breech. However, you are more likely to have a breech baby if:  You have had a previous pregnancy.  You are having more than one baby.  Your baby has certain birth (congenital) defects.  You have started your labor earlier than expected (premature labor).  You have problems with your uterus, such as a tumor or abnormally shaped uterus.  You have too much or not enough fluid surrounding the baby (amniotic fluid). How do I know if my baby is breech? There are no symptoms for you to know that your baby is breech. When you are close to your due date, your health care provider can tell if your baby is breech by  doing:  An abdominal or vaginal (pelvic) exam.  An ultrasound. What can be done if my baby is breech?  Your health care provider may try to turn the baby in your uterus. He or she will use a procedure called external cephalic version (ECV). He or she will place both hands on your abdomen and gently and slowly turn the baby around. It is important to know that ECV can increase your chances of suddenly going into labor. For this reason, an ECV is only done toward the end of a healthy pregnancy. The baby may remain in this position, but sometimes he or she may turn back to the breech position. You and your health care provider will discuss if an ECV is recommended for you and your baby. How will I delivery my baby if he or she is breech? You and your health care provider will discuss the best way to deliver your baby. If your baby is breech, it is less likely that a vaginal delivery will be recommended due to the risks to you and your baby. Your health care provider may recommend that you deliver your baby through a Cesarean section (C-section). A C-section is the surgical delivery of a baby through an incision in the abdomen and the uterus. Summary  A breech birth is when a baby is born with the buttocks or feet first.  Having a breech birth may   increase the risks to your baby.  Your health care provider may try to turn your baby in your uterus using a procedure called an external cephalic version (ECV).  If your baby cannot be turned to a head down position or if your baby remains in a breech position, your health care provider will make recommendations about the safest way to deliver your baby. This information is not intended to replace advice given to you by your health care provider. Make sure you discuss any questions you have with your health care provider. Document Revised: 05/29/2017 Document Reviewed: 03/12/2017 Elsevier Patient Education  2020 Elsevier Inc.  

## 2020-06-29 NOTE — Progress Notes (Signed)
ROB-Pt present for routine prenatal care and 36 weeks cultures. Pt c/o pelvic pain/pressure and braxton hick contractions.

## 2020-06-29 NOTE — Progress Notes (Signed)
ROB: Patient noting some pelvic pressure and Braxton Hicks. Growth ultrasound reviewed, at 19%ile (5 lb 2 oz) with normal fluid, breech presentation. Discussed management of breech fetus, including chiropractor, moxibustion, external cephalic version. Also given information for [employer] website for maneuvers.  If these options fail or patient declines use of these modalities, then discussed that method of delivery recommended would be C-section. Patient notes understanding. States she will try some of the less invasive methods first, but would be open to ECV if no success. To reassess again next week. 36 week cultures performed. Declined flu vaccine. RTC in 1 week.

## 2020-06-30 NOTE — L&D Delivery Note (Signed)
       Delivery Note   Ashley Strickland is a 25 y.o. G2P1001 at [redacted]w[redacted]d Estimated Date of Delivery: 07/27/20  PRE-OPERATIVE DIAGNOSIS:  1) [redacted]w[redacted]d pregnancy.  2) Induction for elevated BMI  POST-OPERATIVE DIAGNOSIS:  1) [redacted]w[redacted]d pregnancy s/p Vaginal, Spontaneous  2) same with viable female infant  Delivery Type: Vaginal, Spontaneous    Delivery Anesthesia: Epidural   Labor Complications:      ESTIMATED BLOOD LOSS: 10  ml    FINDINGS:   1) female infant, Apgar scores of    at 1 minute and    at 5 minutes and a birthweight of   ounces.    2) Nuchal cord: No  SPECIMENS:   PLACENTA:   Appearance: Intact    Removal: Spontaneous      Disposition:    DISPOSITION:  Infant to left in stable condition in the delivery room, with L&D personnel and mother,  NARRATIVE SUMMARY: Labor course:  [name] is a G2P1001 at [redacted]w[redacted]d who presented for induction. AROM was performed. She received misoprostol and began contracting.  She progressed well in labor without pitocin.  She received the appropriate anesthesia and proceeded to complete dilation. She evidenced good maternal expulsive effort during the second stage. She went on to deliver a viable infant. The placenta delivered without problems and was noted to be complete. A perineal and vaginal examination was performed. Episiotomy/Lacerations:  Stana Bunting, M.D. 07/25/2020 12:22 PM

## 2020-07-04 LAB — STREP GP B NAA: Strep Gp B NAA: NEGATIVE

## 2020-07-05 LAB — GC/CHLAMYDIA PROBE AMP
Chlamydia trachomatis, NAA: NEGATIVE
Neisseria Gonorrhoeae by PCR: NEGATIVE

## 2020-07-06 ENCOUNTER — Other Ambulatory Visit: Payer: Self-pay

## 2020-07-06 ENCOUNTER — Ambulatory Visit (INDEPENDENT_AMBULATORY_CARE_PROVIDER_SITE_OTHER): Payer: BC Managed Care – PPO | Admitting: Obstetrics and Gynecology

## 2020-07-06 ENCOUNTER — Encounter: Payer: Self-pay | Admitting: Obstetrics and Gynecology

## 2020-07-06 VITALS — BP 114/78 | HR 82 | Wt 259.6 lb

## 2020-07-06 DIAGNOSIS — Z3483 Encounter for supervision of other normal pregnancy, third trimester: Secondary | ICD-10-CM

## 2020-07-06 DIAGNOSIS — Z3A37 37 weeks gestation of pregnancy: Secondary | ICD-10-CM

## 2020-07-06 DIAGNOSIS — Z6841 Body Mass Index (BMI) 40.0 and over, adult: Secondary | ICD-10-CM

## 2020-07-06 NOTE — Progress Notes (Signed)
ROB: Patient has been doing spinning babies and miles circuit for breech position.  She thinks the baby may have switched position.  She feels kicking higher up.  Unable to tell position on Leopold's maneuvers.  Ultrasound performed revealing VERTEX presentation.  Signs and symptoms of labor discussed.

## 2020-07-13 ENCOUNTER — Ambulatory Visit (INDEPENDENT_AMBULATORY_CARE_PROVIDER_SITE_OTHER): Payer: BC Managed Care – PPO | Admitting: Obstetrics and Gynecology

## 2020-07-13 ENCOUNTER — Encounter: Payer: Self-pay | Admitting: Obstetrics and Gynecology

## 2020-07-13 ENCOUNTER — Other Ambulatory Visit: Payer: Self-pay

## 2020-07-13 VITALS — BP 118/72 | HR 92 | Wt 260.5 lb

## 2020-07-13 DIAGNOSIS — O99210 Obesity complicating pregnancy, unspecified trimester: Secondary | ICD-10-CM

## 2020-07-13 DIAGNOSIS — Z3A38 38 weeks gestation of pregnancy: Secondary | ICD-10-CM

## 2020-07-13 DIAGNOSIS — Z3483 Encounter for supervision of other normal pregnancy, third trimester: Secondary | ICD-10-CM

## 2020-07-13 LAB — POCT URINALYSIS DIPSTICK OB
Bilirubin, UA: NEGATIVE
Glucose, UA: NEGATIVE
Ketones, UA: NEGATIVE
Nitrite, UA: NEGATIVE
Spec Grav, UA: 1.02 (ref 1.010–1.025)
Urobilinogen, UA: 0.2 E.U./dL
pH, UA: 6 (ref 5.0–8.0)

## 2020-07-13 NOTE — Progress Notes (Signed)
ROB-Pt present fore routine prenatal care. Pt c/o lower abd/back, contractions(braxton hicks and pelvic pains.

## 2020-07-13 NOTE — Progress Notes (Signed)
ROB: Notes intermittent contractions. Pelvic pressure. Discussed IOL in 39th week for obesity in pregnancy. Patient desires to discuss with her husband on date due to his work schedule. NST performed today was reviewed and was found to be reactive.  Continue recommended antenatal testing and prenatal care. RTC in 1 week for visit and NST.     NONSTRESS TEST INTERPRETATION  INDICATIONS: Obesity  FHR baseline: 145 RESULTS:Reactive COMMENTS: occasional contractions   PLAN: 1. Continue fetal kick counts twice a day. 2. Continue antepartum testing as scheduled-weekly. Advised on IOL.

## 2020-07-13 NOTE — Patient Instructions (Signed)

## 2020-07-20 ENCOUNTER — Other Ambulatory Visit: Payer: BC Managed Care – PPO

## 2020-07-20 ENCOUNTER — Encounter: Payer: BC Managed Care – PPO | Admitting: Obstetrics and Gynecology

## 2020-07-24 ENCOUNTER — Ambulatory Visit (INDEPENDENT_AMBULATORY_CARE_PROVIDER_SITE_OTHER): Payer: BC Managed Care – PPO | Admitting: Obstetrics and Gynecology

## 2020-07-24 ENCOUNTER — Other Ambulatory Visit: Payer: BC Managed Care – PPO

## 2020-07-24 ENCOUNTER — Other Ambulatory Visit: Payer: Self-pay

## 2020-07-24 ENCOUNTER — Encounter: Payer: Self-pay | Admitting: Obstetrics and Gynecology

## 2020-07-24 VITALS — BP 130/86 | HR 82 | Wt 259.3 lb

## 2020-07-24 DIAGNOSIS — Z3A39 39 weeks gestation of pregnancy: Secondary | ICD-10-CM

## 2020-07-24 DIAGNOSIS — Z3483 Encounter for supervision of other normal pregnancy, third trimester: Secondary | ICD-10-CM

## 2020-07-24 LAB — POCT URINALYSIS DIPSTICK OB
Bilirubin, UA: NEGATIVE
Blood, UA: NEGATIVE
Glucose, UA: NEGATIVE
Ketones, UA: NEGATIVE
Leukocytes, UA: NEGATIVE
Nitrite, UA: NEGATIVE
POC,PROTEIN,UA: NEGATIVE
Spec Grav, UA: 1.01 (ref 1.010–1.025)
Urobilinogen, UA: 0.2 E.U./dL
pH, UA: 6 (ref 5.0–8.0)

## 2020-07-24 NOTE — Addendum Note (Signed)
Addended by: Elonda Husky on: 07/24/2020 09:43 AM   Modules accepted: Orders, SmartSet

## 2020-07-24 NOTE — Progress Notes (Signed)
Yellow

## 2020-07-24 NOTE — Progress Notes (Signed)
ROB: Patient states that she and her husband have talked and she changed her mind regarding induction for elevated BMI.  She is now willing to have the induction and "any day will work".  Induction scheduled for tomorrow. NST today-reactive.  Patient having regular contractions but not feeling them.  Induction discussed in detail.

## 2020-07-24 NOTE — H&P (Deleted)
  The note originally documented on this encounter has been moved the the encounter in which it belongs.  

## 2020-07-24 NOTE — H&P (Signed)
History and Physical   HPI  Ashley Strickland is a 25 y.o. G2P1001 at [redacted]w[redacted]d Estimated Date of Delivery: 07/27/20 who is being admitted for elevated BMI at [redacted] weeks gestation for induction. Patient has a history of postpartum depression. Regular contractions today in the office.   OB History  OB History  Gravida Para Term Preterm AB Living  2 1 1  0 0 1  SAB IAB Ectopic Multiple Live Births  0 0 0 0 1    # Outcome Date GA Lbr Len/2nd Weight Sex Delivery Anes PTL Lv  2 Current           1 Term 09/09/18 [redacted]w[redacted]d / 01:30 7 lb 2.6 oz (3.25 kg) M Vag-Spont EPI  LIV     Name: Ashley Strickland     Apgar1: 8  Apgar5: 9    PROBLEM LIST  Pregnancy complications or risks: Patient Active Problem List   Diagnosis Date Noted  . Fall at home 05/19/2020  . History of postpartum depression 02-04-2020  . Vitamin D deficiency 12/05/2019  . Gastroesophageal reflux disease without esophagitis 11/23/2019  . Eczema 10/12/2019  . Dysautonomia (HCC) 10/12/2019  . Anxiety and depression 09/23/2018  . POTS (postural orthostatic tachycardia syndrome) 02/26/2018  . Class 3 severe obesity with body mass index (BMI) of 45.0 to 49.9 in adult (HCC) 02/26/2018  . 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 Anemia   . Anxiety   . Arrhythmia   . Concussion   . Constipation   . Depression   . Dermatitis   . Joint pain   . Orthostatic hypotension   . Personal history of other mental and behavioral disorders   . POTS (postural orthostatic tachycardia syndrome)   . Vitamin D deficiency      Past Surgical History:  Procedure Laterality Date  . ADENOIDECTOMY       Medications    Patient's Medications   New Prescriptions   No medications on file  Previous Medications   METOCLOPRAMIDE (REGLAN) 10 MG TABLET    Take 1 tablet (10 mg total) by mouth 4 (four) times daily as needed for nausea, vomiting or refractory nausea / vomiting.   ONDANSETRON (ZOFRAN) 4 MG TABLET    Take 1 tablet (4 mg total) by mouth every 8 (eight) hours as needed for nausea or vomiting.   PANTOPRAZOLE (PROTONIX) 40 MG TABLET    Take 1 tablet (40 mg total) by mouth daily.   PRENATAL VIT-FE FUMARATE-FA (PRENATAL MULTIVITAMIN) TABS TABLET    Take 1 tablet by mouth daily at 12 noon.   SERTRALINE (ZOLOFT) 50 MG TABLET    Take 1 tablet (50 mg total) by mouth daily.   TRIAMCINOLONE OINTMENT (KENALOG) 0.1 %    Apply 1 application topically 2 (two) times daily as needed.  Modified Medications   No medications on file  Discontinued Medications   No medications on file     Allergies  Phenergan [promethazine hcl]  Review of Systems  Pertinent items noted in HPI and remainder of comprehensive ROS otherwise negative.  Physical Exam  BP 130/86   Pulse 82   Wt 259 lb 4.8 oz (117.6 kg)   LMP 10/21/2019   BMI 48.99  kg/m   Lungs:  CTA B Cardio: RRR without M/R/G Abd: Soft, gravid, NT Presentation: cephalic EXT: No C/C/ 1+ Edema DTRs: 2+ B CERVIX: Dilation: 3 Effacement (%): 70 Presentation: Vertex  See Prenatal records for more detailed PE.     FHR:  Accelerations: Reactive  Test Results  Results for orders placed or performed in visit on 07/24/20 (from the past 24 hour(s))  POC Urinalysis Dipstick OB     Status: None   Collection Time: 07/24/20  8:46 AM  Result Value Ref Range   Color, UA yellow    Clarity, UA cloudy    Glucose, UA Negative Negative   Bilirubin, UA neg    Ketones, UA neg    Spec Grav, UA 1.010 1.010 - 1.025   Blood, UA neg    pH, UA 6.0 5.0 - 8.0   POC,PROTEIN,UA Negative Negative, Trace, Small (1+), Moderate (2+), Large (3+), 4+   Urobilinogen, UA 0.2 0.2 or 1.0 E.U./dL    Nitrite, UA neg    Leukocytes, UA Negative Negative   Appearance     Odor     Group B Strep negative  Assessment   G2P1001 at [redacted]w[redacted]d Estimated Date of Delivery: 07/27/20  The fetus is reassuring.   Patient Active Problem List   Diagnosis Date Noted  . Fall at home 05/19/2020  . History of postpartum depression 01/19/2020  . Vitamin D deficiency 12/05/2019  . Gastroesophageal reflux disease without esophagitis 11/23/2019  . Eczema 10/12/2019  . Dysautonomia (HCC) 10/12/2019  . Anxiety and depression 09/23/2018  . POTS (postural orthostatic tachycardia syndrome) 02/26/2018  . Class 3 severe obesity with body mass index (BMI) of 45.0 to 49.9 in adult (HCC) 02/26/2018  . 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 Admit to L&D :   2. EFM: -- Category 1 3. Stadol or Epidural if desired once painful contractions begin. 4. Admission labs, IV hydration 5.  Plan AROM and misoprostol.  Elonda Husky, M.D. 07/24/2020 9:40 AM

## 2020-07-25 ENCOUNTER — Inpatient Hospital Stay
Admission: RE | Admit: 2020-07-25 | Discharge: 2020-07-26 | DRG: 807 | Disposition: A | Discharge status: Home / Self Care | Payer: BC Managed Care – PPO | Attending: Obstetrics and Gynecology | Admitting: Obstetrics and Gynecology

## 2020-07-25 ENCOUNTER — Encounter: Payer: Self-pay | Admitting: Obstetrics and Gynecology

## 2020-07-25 ENCOUNTER — Inpatient Hospital Stay: Payer: BC Managed Care – PPO | Admitting: Anesthesiology

## 2020-07-25 ENCOUNTER — Other Ambulatory Visit: Payer: Self-pay

## 2020-07-25 DIAGNOSIS — O99214 Obesity complicating childbirth: Secondary | ICD-10-CM | POA: Diagnosis present

## 2020-07-25 DIAGNOSIS — Z3A39 39 weeks gestation of pregnancy: Secondary | ICD-10-CM | POA: Diagnosis not present

## 2020-07-25 DIAGNOSIS — Z20822 Contact with and (suspected) exposure to covid-19: Secondary | ICD-10-CM | POA: Diagnosis present

## 2020-07-25 DIAGNOSIS — E669 Obesity, unspecified: Secondary | ICD-10-CM | POA: Diagnosis not present

## 2020-07-25 LAB — CBC
HCT: 36.9 % (ref 36.0–46.0)
Hemoglobin: 11.9 g/dL — ABNORMAL LOW (ref 12.0–15.0)
MCH: 25.3 pg — ABNORMAL LOW (ref 26.0–34.0)
MCHC: 32.2 g/dL (ref 30.0–36.0)
MCV: 78.5 fL — ABNORMAL LOW (ref 80.0–100.0)
Platelets: 290 10*3/uL (ref 150–400)
RBC: 4.7 MIL/uL (ref 3.87–5.11)
RDW: 14.5 % (ref 11.5–15.5)
WBC: 12.4 10*3/uL — ABNORMAL HIGH (ref 4.0–10.5)
nRBC: 0 % (ref 0.0–0.2)

## 2020-07-25 LAB — SARS CORONAVIRUS 2 BY RT PCR (HOSPITAL ORDER, PERFORMED IN ~~LOC~~ HOSPITAL LAB): SARS Coronavirus 2: NEGATIVE

## 2020-07-25 LAB — TYPE AND SCREEN
ABO/RH(D): AB POS
Antibody Screen: NEGATIVE

## 2020-07-25 MED ORDER — LACTATED RINGERS IV BOLUS
1000.0000 mL | Freq: Once | INTRAVENOUS | Status: AC
  Administered 2020-07-25: 1000 mL via INTRAVENOUS

## 2020-07-25 MED ORDER — LIDOCAINE HCL (PF) 1 % IJ SOLN
INTRAMUSCULAR | Status: DC | PRN
  Administered 2020-07-25 (×2): 3 mL via SUBCUTANEOUS

## 2020-07-25 MED ORDER — OXYTOCIN BOLUS FROM INFUSION
333.0000 mL | Freq: Once | INTRAVENOUS | Status: AC
  Administered 2020-07-25: 333 mL via INTRAVENOUS

## 2020-07-25 MED ORDER — LACTATED RINGERS IV SOLN: 500.0000 mL | INTRAVENOUS | Status: DC | PRN

## 2020-07-25 MED ORDER — PHENYLEPHRINE 40 MCG/ML (10ML) SYRINGE FOR IV PUSH (FOR BLOOD PRESSURE SUPPORT)
80.0000 ug | PREFILLED_SYRINGE | INTRAVENOUS | Status: DC | PRN
  Filled 2020-07-25: qty 10

## 2020-07-25 MED ORDER — SOD CITRATE-CITRIC ACID 500-334 MG/5ML PO SOLN: 30.0000 mL | ORAL | Status: DC | PRN

## 2020-07-25 MED ORDER — LIDOCAINE-EPINEPHRINE (PF) 1.5 %-1:200000 IJ SOLN
INTRAMUSCULAR | Status: DC | PRN
  Administered 2020-07-25: 3 mL via EPIDURAL
  Administered 2020-07-25: 4 mL via EPIDURAL

## 2020-07-25 MED ORDER — ACETAMINOPHEN 325 MG PO TABS
650.0000 mg | ORAL_TABLET | ORAL | Status: DC | PRN
  Administered 2020-07-25 – 2020-07-26 (×2): 650 mg via ORAL
  Filled 2020-07-25 (×2): qty 2

## 2020-07-25 MED ORDER — TETANUS-DIPHTH-ACELL PERTUSSIS 5-2.5-18.5 LF-MCG/0.5 IM SUSY: 0.5000 mL | PREFILLED_SYRINGE | Freq: Once | INTRAMUSCULAR | Status: DC

## 2020-07-25 MED ORDER — OXYTOCIN-SODIUM CHLORIDE 30-0.9 UT/500ML-% IV SOLN
2.5000 [IU]/h | INTRAVENOUS | Status: DC | PRN
  Administered 2020-07-25: 2.5 [IU]/h via INTRAVENOUS

## 2020-07-25 MED ORDER — LACTATED RINGERS IV SOLN: INTRAVENOUS | Status: DC

## 2020-07-25 MED ORDER — LACTATED RINGERS IV SOLN
500.0000 mL | Freq: Once | INTRAVENOUS | Status: AC
  Administered 2020-07-25: 500 mL via INTRAVENOUS

## 2020-07-25 MED ORDER — MISOPROSTOL 200 MCG PO TABS
ORAL_TABLET | ORAL | Status: AC
  Filled 2020-07-25: qty 4

## 2020-07-25 MED ORDER — ACETAMINOPHEN 325 MG PO TABS: 650.0000 mg | ORAL_TABLET | ORAL | Status: DC | PRN

## 2020-07-25 MED ORDER — DOCUSATE SODIUM 100 MG PO CAPS
100.0000 mg | ORAL_CAPSULE | Freq: Two times a day (BID) | ORAL | Status: DC
  Administered 2020-07-25 – 2020-07-26 (×2): 100 mg via ORAL
  Filled 2020-07-25 (×2): qty 1

## 2020-07-25 MED ORDER — PRENATAL MULTIVITAMIN CH
1.0000 | ORAL_TABLET | Freq: Every day | ORAL | Status: DC
  Administered 2020-07-26: 1 via ORAL
  Filled 2020-07-25: qty 1

## 2020-07-25 MED ORDER — SODIUM CHLORIDE 0.9 % IV SOLN
1.0000 g | INTRAVENOUS | Status: DC
  Filled 2020-07-25 (×5): qty 1000

## 2020-07-25 MED ORDER — DIPHENHYDRAMINE HCL 50 MG/ML IJ SOLN
12.5000 mg | INTRAMUSCULAR | Status: DC | PRN
Start: 2020-07-25 — End: 2020-07-25

## 2020-07-25 MED ORDER — IBUPROFEN 600 MG PO TABS
600.0000 mg | ORAL_TABLET | Freq: Four times a day (QID) | ORAL | Status: DC
  Administered 2020-07-25 – 2020-07-26 (×4): 600 mg via ORAL
  Filled 2020-07-25 (×4): qty 1

## 2020-07-25 MED ORDER — FENTANYL 2.5 MCG/ML W/ROPIVACAINE 0.15% IN NS 100 ML EPIDURAL (ARMC)
12.0000 mL/h | EPIDURAL | Status: DC
  Administered 2020-07-25: 12 mL/h via EPIDURAL

## 2020-07-25 MED ORDER — SODIUM CHLORIDE 0.9 % IV SOLN: 2.0000 g | Freq: Once | INTRAVENOUS | Status: DC

## 2020-07-25 MED ORDER — BENZOCAINE-MENTHOL 20-0.5 % EX AERO: 1.0000 "application " | INHALATION_SPRAY | CUTANEOUS | Status: DC | PRN

## 2020-07-25 MED ORDER — OXYTOCIN 10 UNIT/ML IJ SOLN
INTRAMUSCULAR | Status: AC
  Filled 2020-07-25: qty 2

## 2020-07-25 MED ORDER — EPHEDRINE 5 MG/ML INJ
10.0000 mg | INTRAVENOUS | Status: DC | PRN
  Filled 2020-07-25: qty 2

## 2020-07-25 MED ORDER — OXYTOCIN-SODIUM CHLORIDE 30-0.9 UT/500ML-% IV SOLN
2.5000 [IU]/h | INTRAVENOUS | Status: DC
  Filled 2020-07-25: qty 1000

## 2020-07-25 MED ORDER — LIDOCAINE HCL (PF) 1 % IJ SOLN
30.0000 mL | INTRAMUSCULAR | Status: DC | PRN
  Filled 2020-07-25: qty 30

## 2020-07-25 MED ORDER — ONDANSETRON HCL 4 MG/2ML IJ SOLN
4.0000 mg | Freq: Four times a day (QID) | INTRAMUSCULAR | Status: DC | PRN
  Administered 2020-07-25: 4 mg via INTRAVENOUS
  Filled 2020-07-25: qty 2

## 2020-07-25 MED ORDER — AMMONIA AROMATIC IN INHA
RESPIRATORY_TRACT | Status: AC
  Filled 2020-07-25: qty 10

## 2020-07-25 MED ORDER — ZOLPIDEM TARTRATE 5 MG PO TABS: 5.0000 mg | ORAL_TABLET | Freq: Every evening | ORAL | Status: DC | PRN

## 2020-07-25 MED ORDER — SIMETHICONE 80 MG PO CHEW: 80.0000 mg | CHEWABLE_TABLET | ORAL | Status: DC | PRN

## 2020-07-25 MED ORDER — MISOPROSTOL 25 MCG QUARTER TABLET
50.0000 ug | ORAL_TABLET | ORAL | Status: DC | PRN
  Administered 2020-07-25: 50 ug via VAGINAL
  Filled 2020-07-25: qty 2
  Filled 2020-07-25: qty 1

## 2020-07-25 MED ORDER — CALCIUM CARBONATE ANTACID 500 MG PO CHEW
2.0000 | CHEWABLE_TABLET | Freq: Every day | ORAL | Status: DC
  Administered 2020-07-25 – 2020-07-26 (×2): 400 mg via ORAL
  Filled 2020-07-25 (×2): qty 2

## 2020-07-25 MED ORDER — FENTANYL 2.5 MCG/ML W/ROPIVACAINE 0.15% IN NS 100 ML EPIDURAL (ARMC)
EPIDURAL | Status: AC
  Filled 2020-07-25: qty 100

## 2020-07-25 MED ORDER — BUTORPHANOL TARTRATE 1 MG/ML IJ SOLN
1.0000 mg | INTRAMUSCULAR | Status: DC | PRN
  Administered 2020-07-25: 1 mg via INTRAVENOUS
  Filled 2020-07-25: qty 1

## 2020-07-25 MED ORDER — BUPIVACAINE HCL (PF) 0.25 % IJ SOLN
INTRAMUSCULAR | Status: DC | PRN
  Administered 2020-07-25: 4 mL via EPIDURAL
  Administered 2020-07-25: 3 mL via EPIDURAL

## 2020-07-25 MED ORDER — OXYCODONE-ACETAMINOPHEN 5-325 MG PO TABS: 1.0000 | ORAL_TABLET | ORAL | Status: DC | PRN

## 2020-07-25 MED ORDER — DIPHENHYDRAMINE HCL 25 MG PO CAPS: 25.0000 mg | ORAL_CAPSULE | Freq: Four times a day (QID) | ORAL | Status: DC | PRN

## 2020-07-25 NOTE — Anesthesia Procedure Notes (Signed)
Epidural Patient location during procedure: OB Start time: 07/25/2020 10:37 AM End time: 07/25/2020 10:50 AM  Staffing Anesthesiologist: Piscitello, Cleda Mccreedy, MD Resident/CRNA: Jeanine Luz, CRNA Performed: resident/CRNA   Preanesthetic Checklist Completed: patient identified, IV checked, site marked, risks and benefits discussed, surgical consent, monitors and equipment checked, pre-op evaluation and timeout performed  Epidural Patient position: sitting Prep: ChloraPrep Patient monitoring: heart rate, continuous pulse ox and blood pressure Approach: midline Location: L3-L4 Injection technique: LOR saline  Needle:  Needle type: Tuohy  Needle gauge: 17 G Needle length: 9 cm and 9 Needle insertion depth: 7 cm Catheter type: closed end flexible Catheter size: 19 Gauge Catheter at skin depth: 12 cm Test dose: negative and 1.5% lidocaine with Epi 1:200 K  Assessment Events: blood not aspirated, injection not painful, no injection resistance, no paresthesia and negative IV test  Additional Notes 1 attempt Pt. Evaluated and documentation done after procedure finished. Patient identified. Risks/Benefits/Options discussed with patient including but not limited to bleeding, infection, nerve damage, paralysis, failed block, incomplete pain control, headache, blood pressure changes, nausea, vomiting, reactions to medication both or allergic, itching and postpartum back pain. Confirmed with bedside nurse the patient's most recent platelet count. Confirmed with patient that they are not currently taking any anticoagulation, have any bleeding history or any family history of bleeding disorders. Patient expressed understanding and wished to proceed. All questions were answered. Sterile technique was used throughout the entire procedure. Please see nursing notes for vital signs. Test dose was given through epidural catheter and negative prior to continuing to dose epidural or start infusion.  Warning signs of high block given to the patient including shortness of breath, tingling/numbness in hands, complete motor block, or any concerning symptoms with instructions to call for help. Patient was given instructions on fall risk and not to get out of bed. All questions and concerns addressed with instructions to call with any issues or inadequate analgesia.   Patient tolerated the insertion well without immediate complications.Reason for block:procedure for pain

## 2020-07-25 NOTE — Anesthesia Preprocedure Evaluation (Signed)
Anesthesia Evaluation  Patient identified by MRN, date of birth, ID band Patient awake    Reviewed: Allergy & Precautions, H&P , NPO status , Patient's Chart, lab work & pertinent test results  Airway Mallampati: II  TM Distance: >3 FB Neck ROM: full    Dental  (+) Teeth Intact   Pulmonary    Pulmonary exam normal        Cardiovascular Normal cardiovascular exam     Neuro/Psych PSYCHIATRIC DISORDERS Anxiety Depression    GI/Hepatic GERD  ,  Endo/Other    Renal/GU      Musculoskeletal   Abdominal   Peds  Hematology  (+) Blood dyscrasia, anemia ,   Anesthesia Other Findings   Reproductive/Obstetrics (+) Pregnancy                             Anesthesia Physical Anesthesia Plan  ASA: II  Anesthesia Plan: Epidural   Post-op Pain Management:    Induction:   PONV Risk Score and Plan:   Airway Management Planned:   Additional Equipment:   Intra-op Plan:   Post-operative Plan:   Informed Consent: I have reviewed the patients History and Physical, chart, labs and discussed the procedure including the risks, benefits and alternatives for the proposed anesthesia with the patient or authorized representative who has indicated his/her understanding and acceptance.     Dental Advisory Given  Plan Discussed with: Anesthesiologist and CRNA  Anesthesia Plan Comments:         Anesthesia Quick Evaluation

## 2020-07-25 NOTE — Progress Notes (Signed)
Patient ID: Ashley Strickland, female   DOB: 1996/06/29, 25 y.o.   MRN: [id number]   Patient admitted for induction this morning.  Induction again discussed, rupture of membranes discussed, use of misoprostol discussed.  All questions answered.  Cervical exam = 3 cm/60%/-2  -head well applied to the cervix  FHR -reactive without decelerations Ctx: Rare  AROM -clear small amount  50 mcg misoprostol placed using applicator  Plan: Patient may have epidural when desired   Expect vaginal delivery

## 2020-07-26 ENCOUNTER — Encounter: Payer: Self-pay | Admitting: Obstetrics and Gynecology

## 2020-07-26 LAB — RPR: RPR Ser Ql: NONREACTIVE

## 2020-07-26 MED ORDER — COCONUT OIL OIL: 1.0000 "application " | TOPICAL_OIL | Status: DC | PRN

## 2020-07-26 NOTE — Progress Notes (Signed)
Patient refused varicella immunization.

## 2020-07-26 NOTE — Progress Notes (Signed)
Mother discharged.  Discharge instructions given.  Mother verbalizes understanding.  Transported by auxiliary.  

## 2020-07-26 NOTE — Discharge Instructions (Signed)

## 2020-07-26 NOTE — Discharge Summary (Signed)
Patient Name: Ashley Strickland DOB: [birth year] MRN: [id number]                            Discharge Summary  Date of Admission: 07/25/2020 Date of Discharge: 07/26/2020 Delivering Provider: Linzie Collin   Admitting Diagnosis: Indication for care in labor and delivery, antepartum [O75.9] at [redacted]w[redacted]d Secondary diagnosis:  Active Problems:   Indication for care in labor and delivery, antepartum Patient admitted for induction at 39 weeks for increased BMI.  Mode of Delivery: normal spontaneous vaginal delivery              Discharge diagnosis: Term Pregnancy Delivered      Intrapartum Procedures: Atificial rupture of membranes and Misoprostol  Epidural  Post partum procedures:   Complications: none                     Discharge Day SOAP Note:  Progress Note - Vaginal Delivery  Ashley Strickland is a 25 y.o. G2P2002 now PP day 1 s/p Vaginal, Spontaneous . Delivery was uncomplicated  Subjective  The patient has the following complaints: has no unusual complaints  Pain is controlled with current medications.   Patient is urinating without difficulty.  She is ambulating well.   She desires discharge today.  Objective  Vital signs: BP 108/64 (BP Location: Left Arm)   Pulse 82   Temp 98.1 F (36.7 C) (Oral)   Resp 18   Ht 5\' 1"  (1.549 m)   Wt 117.5 kg   LMP 10/21/2019   SpO2 100%   Breastfeeding Unknown   BMI 48.94 kg/m   Physical Exam: Gen: NAD Fundus Fundal Tone: Firm  Lochia Amount: Scant        Data Review Labs: Lab Results  Component Value Date   WBC 12.4 (H) 07/25/2020   HGB 11.9 (L) 07/25/2020   HCT 36.9 07/25/2020   MCV 78.5 (L) 07/25/2020   PLT 290 07/25/2020   CBC Latest Ref Rng & Units 07/25/2020 05/09/2020 01/27/2020  WBC 4.0 - 10.5 K/uL 12.4(H) 14.0(H) 11.7(H)  Hemoglobin 12.0 - 15.0 g/dL 11.9(L) 11.9 13.2  Hematocrit 36.0 - 46.0 % 36.9 35.4 37.8  Platelets 150 - 400 K/uL 290 240 280   AB POS  Edinburgh Score: Edinburgh Postnatal  Depression Scale Screening Tool 07/26/2020  I have been able to laugh and see the funny side of things. 1  I have looked forward with enjoyment to things. 0  I have blamed myself unnecessarily when things went wrong. 1  I have been anxious or worried for no good reason. 1  I have felt scared or panicky for no good reason. 1  Things have been getting on top of me. 1  I have been so unhappy that I have had difficulty sleeping. 0  I have felt sad or miserable. 1  I have been so unhappy that I have been crying. 1  The thought of harming myself has occurred to me. 0  Edinburgh Postnatal Depression Scale Total 7    Assessment/Plan  Active Problems:   Indication for care in labor and delivery, antepartum    Plan for discharge today.  Discharge Instructions: Per After Visit Summary. Activity: Advance as tolerated. Pelvic rest for 6 weeks.  Also refer to After Visit Summary Diet: Regular Medications: Allergies as of 07/26/2020      Reactions   Phenergan [promethazine Hcl] Other (See Comments)  Seizure      Medication List    STOP taking these medications   metoCLOPramide 10 MG tablet Commonly known as: REGLAN   ondansetron 4 MG tablet Commonly known as: ZOFRAN   pantoprazole 40 MG tablet Commonly known as: PROTONIX   sertraline 50 MG tablet Commonly known as: Zoloft   triamcinolone ointment 0.1 % Commonly known as: KENALOG     TAKE these medications   prenatal multivitamin Tabs tablet Take 1 tablet by mouth daily at 12 noon.      Outpatient follow up:   Follow-up Information    Linzie Collin, MD Follow up in 4 week(s).   Specialties: Obstetrics and Gynecology, Radiology Contact information: [address] [place] [address] [phone]              Postpartum contraception: Will discuss at first office visit post-partum  Discharged Condition: good  Discharged to: home  Newborn Data: Disposition:home with mother  Apgars:  APGAR (1 MIN): 8   APGAR (5 MINS): 9   APGAR (10 MINS):    Baby Feeding: Breast    Ashley Strickland, M.D. 07/26/2020 8:59 AM

## 2020-07-27 NOTE — Anesthesia Postprocedure Evaluation (Signed)
Anesthesia Post Note  Patient: Ashley Strickland  Procedure(s) Performed: AN AD HOC LABOR EPIDURAL  Patient location during evaluation: Mother Baby Anesthesia Type: Epidural Level of consciousness: awake and alert Pain management: pain level controlled Vital Signs Assessment: post-procedure vital signs reviewed and stable Respiratory status: spontaneous breathing, nonlabored ventilation and respiratory function stable Cardiovascular status: stable Postop Assessment: no headache, no backache, patient able to bend at knees and able to ambulate Anesthetic complications: no   No complications documented.   Last Vitals: There were no vitals filed for this visit.  Last Pain: There were no vitals filed for this visit.               Cleda Mccreedy Piscitello

## 2020-07-30 ENCOUNTER — Ambulatory Visit (INDEPENDENT_AMBULATORY_CARE_PROVIDER_SITE_OTHER): Payer: BC Managed Care – PPO | Admitting: Obstetrics and Gynecology

## 2020-07-30 ENCOUNTER — Telehealth: Payer: Self-pay

## 2020-07-30 ENCOUNTER — Encounter: Payer: Self-pay | Admitting: Obstetrics and Gynecology

## 2020-07-30 ENCOUNTER — Other Ambulatory Visit: Payer: Self-pay

## 2020-07-30 VITALS — BP 108/74 | HR 84 | Ht 61.0 in | Wt 251.7 lb

## 2020-07-30 DIAGNOSIS — N61 Mastitis without abscess: Secondary | ICD-10-CM

## 2020-07-30 MED ORDER — DICLOXACILLIN SODIUM 500 MG PO CAPS: 500.0000 mg | ORAL_CAPSULE | Freq: Four times a day (QID) | ORAL | 0 refills | Status: DC

## 2020-07-30 NOTE — Progress Notes (Signed)
HPI:      [name] is a 25 y.o. [phone] who LMP was No LMP recorded.  Subjective:   She presents today complaining of a 1 day history of low-grade fever and pain especially in her right breast.  She says that her breast is becoming reddened.  She was breast-feeding but now is exclusively pumping because of pain.  She plans to breast-feed again.    Hx: The following portions of the patient's history were reviewed and updated as appropriate:             She  has a past medical history of Anemia, Anxiety, Arrhythmia, Concussion, Constipation, Depression, Dermatitis, Joint pain, Orthostatic hypotension, Personal history of other mental and behavioral disorders, POTS (postural orthostatic tachycardia syndrome), and Vitamin D deficiency. She does not have any pertinent problems on file. She  has a past surgical history that includes Adenoidectomy. Her family history includes Anxiety disorder in her father, maternal grandfather, and mother; Arrhythmia in her brother and mother; Arthritis in her mother; COPD in her maternal grandfather; Clotting disorder in her maternal grandfather; Depression in her father, maternal grandfather, and mother; Heart disease in her maternal grandfather; Hodgkin's lymphoma in her father; Polycystic ovary syndrome in her mother. She  reports that she has never smoked. She has never used smokeless tobacco. She reports previous alcohol use. She reports that she does not use drugs. She has a current medication list which includes the following prescription(s): dicloxacillin and prenatal multivitamin. She is allergic to phenergan [promethazine hcl].       Review of Systems:  Review of Systems  Constitutional: Denied constitutional symptoms, night sweats, recent illness, fatigue, fever, insomnia and weight loss.  Eyes: Denied eye symptoms, eye pain, photophobia, vision change and visual disturbance.  Ears/Nose/Throat/Neck: Denied ear, nose, throat or neck symptoms,  hearing loss, nasal discharge, sinus congestion and sore throat.  Cardiovascular: Denied cardiovascular symptoms, arrhythmia, chest pain/pressure, edema, exercise intolerance, orthopnea and palpitations.  Respiratory: Denied pulmonary symptoms, asthma, pleuritic pain, productive sputum, cough, dyspnea and wheezing.  Gastrointestinal: Denied, gastro-esophageal reflux, melena, nausea and vomiting.  Genitourinary: See HPI for additional information.  Musculoskeletal: Denied musculoskeletal symptoms, stiffness, swelling, muscle weakness and myalgia.  Dermatologic: Denied dermatology symptoms, rash and scar.  Neurologic: Denied neurology symptoms, dizziness, headache, neck pain and syncope.  Psychiatric: Denied psychiatric symptoms, anxiety and depression.  Endocrine: Denied endocrine symptoms including hot flashes and night sweats.   Meds:   Current Outpatient Medications on File Prior to Visit  Medication Sig Dispense Refill  . Prenatal Vit-Fe Fumarate-FA (PRENATAL MULTIVITAMIN) TABS tablet Take 1 tablet by mouth daily at 12 noon.     No current facility-administered medications on file prior to visit.          Objective:     Vitals:   07/30/20 1057  BP: 108/74  Pulse: 84   Filed Weights   07/30/20 1057  Weight: 251 lb 11.2 oz (114.2 kg)              Bilateral breast examination:  Right breast tender to palpation in the upper inner quadrant reddened.  Left breast normal postpartum exam.  Assessment:    [id number] Patient Active Problem List   Diagnosis Date Noted  . Indication for care in labor and delivery, antepartum 07/25/2020  . Fall at home 05/19/2020  . History of postpartum depression 01/19/2020  . Vitamin D deficiency 12/05/2019  . Gastroesophageal reflux disease without esophagitis 11/23/2019  . Eczema 10/12/2019  . Dysautonomia (  HCC) 10/12/2019  . Anxiety and depression 09/23/2018  . POTS (postural orthostatic tachycardia syndrome) 02/26/2018  . 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 Mastitis in female     Probable right mastitis based on symptoms and breast appearance/exam.   Plan:            1.  Dicloxacillin x7 days  2.  Patient to continue to breast-feed/pump from both breasts, frequent warm compresses, gentle massage. Orders No orders of the defined types were placed in this encounter.    Meds ordered this encounter  Medications  . dicloxacillin (DYNAPEN) 500 MG capsule    Sig: Take 1 capsule (500 mg total) by mouth 4 (four) times daily for 7 days.    Dispense:  28 capsule    Refill:  0      F/U  Return for Pt to contact us if symptoms worsen, As scheduled. I spent 21 minutes involved in the care of this patient preparing to see the patient by obtaining and reviewing her medical history (including labs, imaging tests and prior procedures), documenting clinical information in the electronic health record (EHR), counseling and coordinating care plans, writing and sending prescriptions, ordering tests or procedures and directly communicating with the patient by discussing pertinent items from her history and physical exam as well as detailing my assessment and plan as noted above so that she has an informed understanding.  All of her questions were answered.  Elonda Husky, M.D. 07/30/2020 11:19 AM

## 2020-07-30 NOTE — Telephone Encounter (Signed)
I have scheduled patient to come in today.  

## 2020-07-30 NOTE — Telephone Encounter (Signed)
Patient called in stating that her pharmacy does not carry the Rx prescribed to her in 500 mg that they only have it in 250 mg. Patient was wanting to know if her provider could re-send it in as she wont be able to get her prescription for several days.

## 2020-07-30 NOTE — Telephone Encounter (Signed)
Patient called in stating that she thinks she has mastitis. Patient states she is running a fever. Patient is wanting to come in today to be seen. Patient is scheduled for tomorrow however would like to know if she can be put on for today. Could you please advise?

## 2020-07-31 ENCOUNTER — Encounter: Payer: BC Managed Care – PPO | Admitting: Obstetrics and Gynecology

## 2020-07-31 MED ORDER — DICLOXACILLIN SODIUM 500 MG PO CAPS: 500.0000 mg | ORAL_CAPSULE | Freq: Four times a day (QID) | ORAL | 0 refills | Status: AC

## 2020-07-31 NOTE — Addendum Note (Signed)
Addended by: Dorian Pod on: 07/31/2020 09:04 AM   Modules accepted: Orders

## 2020-07-31 NOTE — Telephone Encounter (Signed)
I have sent prescription to [facility] in [place] per patient.

## 2020-08-31 ENCOUNTER — Ambulatory Visit: Payer: Self-pay

## 2020-08-31 NOTE — Lactation Note (Addendum)
This note was copied from a baby's chart. Lactation Consultation Note  Patient Name: Ashley Strickland Today's Date: 08/31/2020   Age:23 wk.o. Seeing [facility] peds, Dr. Earnest Conroy saw baby in hospital after birth.   Maternal Data  Mom has been pumping breasts every 4 hrs for last few days and has not been offering breasts except once or twice. Her nipples are everted, breasts soft.    She breastfed her last child who is 2 yrs old x 5 mths, he had a tongue and lip tie. Feeding  Baby's wt is up to 8-13 from 6-1 at birth and 7-6 at first ped visit, many wets per day and yellow seedy stools are more than 3 per day.  Mom states that baby pulls off breast frequently during feeding, gassy and spitting up, she wonders if baby is not latching deep enough, I do not observe a tight frenulum,  mom latched baby to right breast first in cradle hold with pillow support under her arm, , I showed mom how to support and shape breast to allow baby to latch deeply, shown how to flange upper lip at breast and apply pressure with her finger to chin to widen latch, mom states this is much better, once first let down occurred baby did pull back and pinch in lips, suspect milk flow was too fast, had mom lean back to slow milk flow and realign latch and baby was more comfortable after this, nursed well x 15 min on right and mom states this was a much better, longer feedjng that before.  BAby was burped, spit small amt and place on left breast, mom reports that this breast had faster let down and baby had harder time on this breast, had mom recline more and elevate head of baby slightly more, nursed well x 5 min , became fussy,  came off breast, burped and spit up mod amt milk, got hiccups and then mom was reclined more, baby back to left breast and nursed 5 more min with audible swallows, hiccups stopped and baby calmer, started falling asleep and came off breast and burped again with no reflux this time.  Baby placed back into  clothes and into car seat, baby calm and content, alert.  Mom is more confident about getting baby back to breast.         LATCH Score                    Lactation Tools Discussed/Used    Interventions  Breastfeed baby with mom  in reclining position or laying on side, with head of baby elevated, burp frequently, nurse q 2-3 hrs, keep baby upright after feedings for at least 30 min.  if milk supply seems decreased, begin pumping after at least every other feed 3-5 min post decreased flow.  Call [facility] if she has questions or further problems  Discharge    Consult Status  [facility] peds faxed [place] an order and hx info for Lactation consult, this was sent to Micah Flesher in Information Management,  Coding     Dyann Kief 08/31/2020, 4:25 PM

## 2020-09-04 ENCOUNTER — Other Ambulatory Visit: Payer: Self-pay

## 2020-09-04 ENCOUNTER — Encounter: Payer: Self-pay | Admitting: Obstetrics and Gynecology

## 2020-09-04 ENCOUNTER — Ambulatory Visit (INDEPENDENT_AMBULATORY_CARE_PROVIDER_SITE_OTHER): Payer: BC Managed Care – PPO | Admitting: Obstetrics and Gynecology

## 2020-09-04 DIAGNOSIS — F410 Panic disorder [episodic paroxysmal anxiety] without agoraphobia: Secondary | ICD-10-CM

## 2020-09-04 DIAGNOSIS — F064 Anxiety disorder due to known physiological condition: Secondary | ICD-10-CM

## 2020-09-04 DIAGNOSIS — Z3009 Encounter for other general counseling and advice on contraception: Secondary | ICD-10-CM

## 2020-09-04 DIAGNOSIS — Z3042 Encounter for surveillance of injectable contraceptive: Secondary | ICD-10-CM | POA: Diagnosis not present

## 2020-09-04 MED ORDER — SERTRALINE HCL 50 MG PO TABS
50.0000 mg | ORAL_TABLET | Freq: Every day | ORAL | 1 refills | Status: AC
Start: 2020-09-04 — End: 2020-12-03

## 2020-09-04 MED ORDER — MEDROXYPROGESTERONE ACETATE 150 MG/ML IM SUSP
150.0000 mg | Freq: Once | INTRAMUSCULAR | Status: AC
  Administered 2020-09-04: 150 mg via INTRAMUSCULAR

## 2020-09-04 NOTE — Addendum Note (Signed)
Addended by: Dorian Pod on: 09/04/2020 03:19 PM   Modules accepted: Orders

## 2020-09-04 NOTE — Progress Notes (Signed)
Patient is here for 6 week postpartum visit. She has been breastfeeding her baby. She has no bleeding. No pain with intercourse. She wants to discuss depo as an options for birth control.

## 2020-09-04 NOTE — Progress Notes (Signed)
HPI:      [name] is a 25 y.o. [phone] who LMP was No LMP recorded.  Subjective:   She presents today for postpartum checkup.  She reports that she continues to experience intermittent back and hip pain.  She thinks it may be from the epidural. She would like to start Depo-Provera for birth control.  She is breast-feeding full-time.  She has resumed intercourse without issue.  She would only like to be on Depo for a few months because her husband plans vasectomy.(He has an appointment already.) She also states that her panic attacks and anxiety have greatly increased and would like to restart Zoloft for this issue.  She has taken Zoloft successfully before.  She is already working on Brewing technologist and identifying triggers.    Hx: The following portions of the patient's history were reviewed and updated as appropriate:             She  has a past medical history of Anemia, Anxiety, Arrhythmia, Concussion, Constipation, Depression, Dermatitis, Joint pain, Orthostatic hypotension, Personal history of other mental and behavioral disorders, POTS (postural orthostatic tachycardia syndrome), and Vitamin D deficiency. She does not have any pertinent problems on file. She  has a past surgical history that includes Adenoidectomy. Her family history includes Anxiety disorder in her father, maternal grandfather, and mother; Arrhythmia in her brother and mother; Arthritis in her mother; COPD in her maternal grandfather; Clotting disorder in her maternal grandfather; Depression in her father, maternal grandfather, and mother; Heart disease in her maternal grandfather; Hodgkin's lymphoma in her father; Polycystic ovary syndrome in her mother. She  reports that she has never smoked. She has never used smokeless tobacco. She reports previous alcohol use. She reports that she does not use drugs. She has a current medication list which includes the following prescription(s): prenatal multivitamin  and sertraline. She is allergic to phenergan [promethazine hcl].       Review of Systems:  Review of Systems  Constitutional: Denied constitutional symptoms, night sweats, recent illness, fatigue, fever, insomnia and weight loss.  Eyes: Denied eye symptoms, eye pain, photophobia, vision change and visual disturbance.  Ears/Nose/Throat/Neck: Denied ear, nose, throat or neck symptoms, hearing loss, nasal discharge, sinus congestion and sore throat.  Cardiovascular: Denied cardiovascular symptoms, arrhythmia, chest pain/pressure, edema, exercise intolerance, orthopnea and palpitations.  Respiratory: Denied pulmonary symptoms, asthma, pleuritic pain, productive sputum, cough, dyspnea and wheezing.  Gastrointestinal: Denied, gastro-esophageal reflux, melena, nausea and vomiting.  Genitourinary: Denied genitourinary symptoms including symptomatic vaginal discharge, pelvic relaxation issues, and urinary complaints.  Musculoskeletal: See HPI for additional information.  Dermatologic: Denied dermatology symptoms, rash and scar.  Neurologic: Denied neurology symptoms, dizziness, headache, neck pain and syncope.  Psychiatric: See HPI for additional information.  Endocrine: Denied endocrine symptoms including hot flashes and night sweats.   Meds:   Current Outpatient Medications on File Prior to Visit  Medication Sig Dispense Refill  . Prenatal Vit-Fe Fumarate-FA (PRENATAL MULTIVITAMIN) TABS tablet Take 1 tablet by mouth daily at 12 noon.     No current facility-administered medications on file prior to visit.       The pregnancy intention screening data noted above was reviewed. Potential methods of contraception were discussed. The patient elected to proceed with Hormonal Injection.     Objective:     Vitals:   09/04/20 1419  BP: 100/70  Pulse: (!) 126  Resp: 16  SpO2: 98%   Filed Weights   09/04/20 1419  Weight: 243 lb  1.6 oz (110.3 kg)              Pelvic examination    Pelvic:   Vulva: Normal appearance.  No lesions.  No abnormal scarring.    Vagina: No lesions or abnormalities noted.  Support: Normal pelvic support.  Urethra No masses tenderness or scarring.  Meatus Normal size without lesions or prolapse.  Cervix: Normal ectropion.  No lesions.  Anus: Normal exam.  No lesions.  Perineum: Normal exam.  No lesions.  Healed well.          Bimanual   Uterus: Normal size.  Non-tender.  Mobile.  AV.  Adnexae: No masses.  Non-tender to palpation.  Cul-de-sac: Negative for abnormality.     Assessment:    [id number] Patient Active Problem List   Diagnosis Date Noted  . Indication for care in labor and delivery, antepartum 07/25/2020  . Fall at home 05/19/2020  . History of postpartum depression 01/19/2020  . Vitamin D deficiency 12/05/2019  . Gastroesophageal reflux disease without esophagitis 11/23/2019  . Eczema 10/12/2019  . Dysautonomia (HCC) 10/12/2019  . Anxiety and depression 09/23/2018  . POTS (postural orthostatic tachycardia syndrome) 02/26/2018  . Class 3 severe obesity with body mass index (BMI) of 45.0 to 49.9 in adult (HCC) 02/26/2018  . 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 Postpartum care and examination immediately after delivery   2. Birth control counseling   3. Anxiety disorder due to general medical condition with panic attack     Patient desires Depo until her husband gets a vasectomy  She would like to restart Zoloft  Complains of back and hip pain from her epidural.   Plan:            1.  Depo given follow-up 3 months  2.  Discussed panic attacks triggers relaxation techniques.  Patient will start Zoloft.  3.  Expectant management regarding back and hip pain from "epidural".  Patient is willing to give it a few months and see if it improves.  With no improvement consider orthopedic referral. Orders No orders of the defined types were placed in this encounter.    Meds ordered this encounter  Medications  .  sertraline (ZOLOFT) 50 MG tablet    Sig: Take 1 tablet (50 mg total) by mouth daily.    Dispense:  90 tablet    Refill:  1      F/U  Return in about 3 months (around 12/05/2020). I spent 21 additional minutes discussing panic attacks, use of Zoloft, back and hip pain from epidural plus the usual care of this patient preparing to see the patient by obtaining and reviewing her medical history (including labs, imaging tests and prior procedures), documenting clinical information in the electronic health record (EHR), counseling and coordinating care plans, writing and sending prescriptions, ordering tests or procedures and directly communicating with the patient by discussing pertinent items from her history and physical exam as well as detailing my assessment and plan as noted above so that she has an informed understanding.  All of her questions were answered.  Elonda Husky, M.D. 09/04/2020 2:54 PM

## 2020-09-10 ENCOUNTER — Ambulatory Visit: Payer: BC Managed Care – PPO | Attending: Obstetrics and Gynecology | Admitting: Physical Therapy

## 2020-09-13 ENCOUNTER — Encounter: Payer: Self-pay | Admitting: Obstetrics and Gynecology

## 2020-09-18 ENCOUNTER — Encounter: Payer: Self-pay | Admitting: Obstetrics and Gynecology

## 2020-09-28 ENCOUNTER — Encounter: Payer: BC Managed Care – PPO | Admitting: Obstetrics and Gynecology

## 2020-10-17 NOTE — Patient Instructions (Incomplete)

## 2020-10-18 ENCOUNTER — Encounter: Payer: BC Managed Care – PPO | Admitting: Obstetrics and Gynecology

## 2020-10-18 DIAGNOSIS — Z01419 Encounter for gynecological examination (general) (routine) without abnormal findings: Secondary | ICD-10-CM

## 2020-10-18 NOTE — Progress Notes (Deleted)
Annual exam-Pt

## 2020-11-05 ENCOUNTER — Encounter: Payer: Self-pay | Admitting: Obstetrics and Gynecology

## 2020-12-05 ENCOUNTER — Encounter: Payer: BC Managed Care – PPO | Admitting: Obstetrics and Gynecology

## 2020-12-19 ENCOUNTER — Encounter: Payer: Self-pay | Admitting: Obstetrics and Gynecology

## 2020-12-21 ENCOUNTER — Encounter: Payer: BC Managed Care – PPO | Admitting: Family Medicine

## 2022-01-02 ENCOUNTER — Telehealth: Payer: Self-pay | Admitting: Internal Medicine

## 2022-01-02 NOTE — Telephone Encounter (Signed)
3 attempts to schedule fu appt from recall list.   Deleting recall.   

## 2022-04-14 ENCOUNTER — Encounter: Payer: Self-pay | Admitting: Obstetrics and Gynecology
